# Patient Record
Sex: Male | Born: 1955
Health system: Southern US, Community
[De-identification: ages and names within clinical notes are randomized; demographics above are authoritative.]

## PROBLEM LIST (undated history)

## (undated) DIAGNOSIS — I6529 Occlusion and stenosis of unspecified carotid artery: Secondary | ICD-10-CM

## (undated) DIAGNOSIS — I351 Nonrheumatic aortic (valve) insufficiency: Secondary | ICD-10-CM

## (undated) DIAGNOSIS — I10 Essential (primary) hypertension: Secondary | ICD-10-CM

## (undated) DIAGNOSIS — Z952 Presence of prosthetic heart valve: Secondary | ICD-10-CM

## (undated) DIAGNOSIS — N200 Calculus of kidney: Secondary | ICD-10-CM

## (undated) DIAGNOSIS — D126 Benign neoplasm of colon, unspecified: Secondary | ICD-10-CM

## (undated) DIAGNOSIS — I219 Acute myocardial infarction, unspecified: Secondary | ICD-10-CM

## (undated) DIAGNOSIS — M1990 Unspecified osteoarthritis, unspecified site: Secondary | ICD-10-CM

## (undated) DIAGNOSIS — I2102 ST elevation (STEMI) myocardial infarction involving left anterior descending coronary artery: Secondary | ICD-10-CM

## (undated) DIAGNOSIS — K648 Other hemorrhoids: Secondary | ICD-10-CM

## (undated) DIAGNOSIS — E7800 Pure hypercholesterolemia, unspecified: Secondary | ICD-10-CM

## (undated) DIAGNOSIS — N529 Male erectile dysfunction, unspecified: Secondary | ICD-10-CM

## (undated) DIAGNOSIS — R161 Splenomegaly, not elsewhere classified: Secondary | ICD-10-CM

## (undated) DIAGNOSIS — K5790 Diverticulosis of intestine, part unspecified, without perforation or abscess without bleeding: Secondary | ICD-10-CM

## (undated) DIAGNOSIS — I77810 Thoracic aortic ectasia: Secondary | ICD-10-CM

## (undated) HISTORY — DX: Occlusion and stenosis of unspecified carotid artery: I65.29

## (undated) HISTORY — PX: EYE SURGERY: SHX253

## (undated) HISTORY — DX: Male erectile dysfunction, unspecified: N52.9

## (undated) HISTORY — DX: Acute myocardial infarction, unspecified: I21.9

## (undated) HISTORY — DX: ST elevation (STEMI) myocardial infarction involving left anterior descending coronary artery: I21.02

## (undated) HISTORY — DX: Nonrheumatic aortic (valve) insufficiency: I35.1

## (undated) HISTORY — DX: Presence of prosthetic heart valve: Z95.2

## (undated) HISTORY — DX: Pure hypercholesterolemia, unspecified: E78.00

## (undated) HISTORY — DX: Unspecified osteoarthritis, unspecified site: M19.90

## (undated) HISTORY — DX: Splenomegaly, not elsewhere classified: R16.1

## (undated) HISTORY — PX: COLONOSCOPY: SHX174

## (undated) HISTORY — DX: Benign neoplasm of colon, unspecified: D12.6

## (undated) HISTORY — DX: Thoracic aortic ectasia: I77.810

## (undated) HISTORY — DX: Essential (primary) hypertension: I10

## (undated) HISTORY — DX: Diverticulosis of intestine, part unspecified, without perforation or abscess without bleeding: K57.90

## (undated) HISTORY — DX: Calculus of kidney: N20.0

## (undated) HISTORY — DX: Other hemorrhoids: K64.8

---

## 1991-05-31 HISTORY — PX: LUMBAR DISC SURGERY: SHX700

## 1993-05-30 HISTORY — PX: WRIST FRACTURE SURGERY: SHX121

## 1996-05-30 HISTORY — PX: SHOULDER SURGERY: SHX246

## 1998-05-30 DIAGNOSIS — D126 Benign neoplasm of colon, unspecified: Secondary | ICD-10-CM

## 1998-05-30 HISTORY — DX: Benign neoplasm of colon, unspecified: D12.6

## 1998-08-31 ENCOUNTER — Emergency Department (HOSPITAL_COMMUNITY): Admission: EM | Admit: 1998-08-31 | Discharge: 1998-08-31 | Payer: Self-pay | Admitting: Emergency Medicine

## 1998-08-31 ENCOUNTER — Encounter: Payer: Self-pay | Admitting: Emergency Medicine

## 1999-05-18 ENCOUNTER — Ambulatory Visit (HOSPITAL_COMMUNITY): Admission: RE | Admit: 1999-05-18 | Discharge: 1999-05-18 | Payer: Self-pay | Admitting: *Deleted

## 2001-10-17 ENCOUNTER — Encounter: Admission: RE | Admit: 2001-10-17 | Discharge: 2001-10-17 | Payer: Self-pay | Admitting: Family Medicine

## 2001-10-17 ENCOUNTER — Encounter: Payer: Self-pay | Admitting: Family Medicine

## 2003-09-18 ENCOUNTER — Ambulatory Visit (HOSPITAL_COMMUNITY): Admission: RE | Admit: 2003-09-18 | Discharge: 2003-09-18 | Payer: Self-pay | Admitting: *Deleted

## 2005-09-12 ENCOUNTER — Encounter: Admission: RE | Admit: 2005-09-12 | Discharge: 2005-09-12 | Payer: Self-pay | Admitting: Family Medicine

## 2006-05-30 HISTORY — PX: REFRACTIVE SURGERY: SHX103

## 2007-05-31 HISTORY — PX: CHOLECYSTECTOMY: SHX55

## 2008-01-21 ENCOUNTER — Encounter: Admission: RE | Admit: 2008-01-21 | Discharge: 2008-01-21 | Payer: Self-pay | Admitting: Family Medicine

## 2008-01-24 ENCOUNTER — Encounter (INDEPENDENT_AMBULATORY_CARE_PROVIDER_SITE_OTHER): Payer: Self-pay | Admitting: General Surgery

## 2008-01-24 ENCOUNTER — Ambulatory Visit (HOSPITAL_COMMUNITY): Admission: RE | Admit: 2008-01-24 | Discharge: 2008-01-24 | Payer: Self-pay | Admitting: General Surgery

## 2009-04-01 ENCOUNTER — Encounter: Admission: RE | Admit: 2009-04-01 | Discharge: 2009-04-01 | Payer: Self-pay | Admitting: Family Medicine

## 2010-10-12 NOTE — Op Note (Signed)
Dwayne [name], Dwayne [name]              ACCOUNT NO.:  [id number]   MEDICAL RECORD NO.:  [id number]          PATIENT TYPE:  AMB   LOCATION:  SDS                          FACILITY:  [facility]   PHYSICIAN:  Cherylynn Ridges, M.D.    DATE OF BIRTH:  Sep 10, 1955   DATE OF PROCEDURE:  01/24/2008  DATE OF DISCHARGE:                               OPERATIVE REPORT   PREOPERATIVE DIAGNOSIS:  Symptomatic cholelithiasis.   POSTOPERATIVE DIAGNOSIS:  Symptomatic cholelithiasis with acute  cholecystitis.   PROCEDURE:  Laparoscopic cholecystectomy with cholangiogram.   SURGEON:  Cherylynn Ridges, MD   ANESTHESIA:  General endotracheal.   ESTIMATED BLOOD LOSS:  Less than 20 mL.   COMPLICATIONS:  None.   CONDITION:  Stable.   FINDINGS:  The patient had an acutely inflamed tense gallbladder with  multiple multifaceted stones contained within.  These were mixed  cholesterol and pigmented stones.   INDICATIONS FOR OPERATION:  The patient is a 55 year old male with  severe right upper quadrant and epigastric pain who now comes in for  laparoscopic cholecystectomy.   SPECIMENS:  Gallbladder.   OPERATION:  The patient was taken to the operating room, placed on the  table in the supine position.  After an adequate general endotracheal  anesthetic was administered, he was prepped and draped in the usual  sterile manner exposing the midline and the right upper quadrant  peritoneum.   A supraumbilical curvilinear incision was made using #11 blade and taken  down to the midline fascia.  We grabbed the midline fascia with Kocher  clamps x2.  I then made an incision between the Kocher clamps with a #15  blade into the preperitoneal space.  We grabbed the edges of the fascia  using Kocher clamps and while tenting up bluntly dissected down through  the preperitoneal space entering into the peritoneal cavity.  Once we  had an adequate opening into the peritoneal cavity, a purse-string  suture of 0 Vicryl was passed  around the fascial opening, which secured  in a Hasson cannula, which was subsequently slipped into the peritoneal  cavity.  Once the Hasson cannula was in place, we insufflated carbon  dioxide gas up to a maximal intraabdominal pressure of 15 mmHg.  Once  this was done, the patient was placed in reverse Trendelenburg.  The  left side was tilted down.   Two right costal margin, a subcostal 5-mm cannula, and a subxiphoid 11-  mm cannula passed under direct vision.  Once these cannulas were in  place, we were able to dissect out the gallbladder using a ratcheted  grasp through the lateral-most cannula retracting the gallbladder  towards the anterior abdominal wall in the right upper quadrant.  It was  noted during the first attempt to do so that the gallbladder was too  tense to grasp and therefore we had to use a Nezhat aspirator and needle  in order to decompress the gallbladder from the dome.  This allowed [place]  to grasp it and then retract it in the manner previously described using  a second grasper on the infundibulum, which was  packed with stones.   We were able to dissect out the peritoneum overlying the triangle of  Calot and hepatoduodenal triangle isolating both the cystic duct and the  cystic artery.  Once we had adequate dissection of the cystic duct, a  clip was placed along the gallbladder side.  A cholecystodochotomy was  made using laparoscopic scissors, and a cholangiogram was performed  using a Cook catheter, which had been passed though the anterior  abdominal wall.  The cholangiogram showed good flow into the duodenum,  good proximal filling, no filling defects, and no dilatation of the  duct.  There was slight bowing of the common duct medially from unknown  sources; however, there was no obstruction.   Once the cholangiogram was completed, we removed the clips securing the  catheter in place, and then subsequently clipped the cystic duct  distally x3 and transected  the cystic duct.  The cystic artery was  subsequently found and two clips were placed proximally and one distally  as we transected the cystic artery.  We then carefully dissected out the  gallbladder from its bed using electrocautery, with an opening being  made near the dome, where several stones fell out which we were able to  retrieve with a large grasper.  Once we had the gallbladder completely  detached, we obtained hemostasis in the hepatic bed using  electrocautery.  There was no bleeding at the end.  We use an EndoCatch  bag to retrieve the gallbladder from the supraumbilical site.  The  fascia there did have to be enlarged in order to remove the gallbladder  and the stones.  Once this was done and the gallbladder was removed, we  passed the Hasson cannula back in place and inspected the hepatic bed  for bleeding.  There was minimal bleeding.  We aspirated and irrigated  about a liter of saline solution.  Then, we aspirated all fluid and gas  from above the liver and removed all cannulas.  The purse-string suture  which had held in the Hasson cannula was tied down initially, and there  was still a fascial opening, which we fixed with a figure-of-eight  stitch of 0 Vicryl using a UR-6 needle.  Once this was done, we had  complete fascial closure at the supraumbilical site.  We injected 0.25%  Marcaine at all sites.  We closed the skin at the subxiphoid and the  supraumbilical sites using a running subcuticular stitch of 4-0 Vicryl.  Then, we used Dermabond to close the lateral cannula sites and also to  seal the subxiphoid and the supraumbilical sites.  Steri-Strips and  Tegaderm were used to complete the dressing.  All counts were correct  including needle, sponges, and instruments.      Cherylynn Ridges, M.D.  Electronically Signed     JOW/MEDQ  D:  01/24/2008  T:  01/25/2008  Job:  [id number]

## 2010-10-15 NOTE — Op Note (Signed)
NAME:  Dwayne [name], PARTCH NO.:  [id number]   MEDICAL RECORD NO.:  [id number]                   PATIENT TYPE:  AMB   LOCATION:  ENDO                                 FACILITY:  [facility]   PHYSICIAN:  Georgiana Spinner, M.D.                 DATE OF BIRTH:  [birth year]   DATE OF PROCEDURE:  09/18/2003  DATE OF DISCHARGE:                                 OPERATIVE REPORT   PROCEDURE:  Colonoscopy.   INDICATIONS FOR PROCEDURE:  Previous colon polyps.   ANESTHESIA:  Demerol 100, Versed 10 mg.   PROCEDURE:  With the patient mildly sedated in the left lateral decubitus  position, I attempted a rectal examination but he apparently had a fissure  and was very tender and would not allow a full rectal.  Subsequently, the  Olympus videoscopic colonoscope was inserted in the rectum and passed under  direct vision to the cecum identified by the base of the cecum, crows foot,  and ileocecal valve, the later of which were photographed.  From this point,  the colonoscope was slowly withdrawn taking circumferential views of the  colonic mucosa stopping only in the rectum which appeared normal on direct  and showed hemorrhoids on retroflex view.  The endoscope was straightened  and withdrawn.  The patient's vital signs and pulse oximeter remained  stable.  The patient tolerated the procedure well without apparent  complications.   FINDINGS:  Internal hemorrhoids, otherwise unremarkable colonoscopic  examination, no evidence of polyps at this time.   PLAN:  Repeat examination in five years or as needed.                                               Georgiana Spinner, M.D.    GMO/MEDQ  D:  09/18/2003  T:  09/18/2003  Job:  [id number]   cc:   Bryan Lemma. Manus Gunning, M.D.  [address] [place]  [place] [address]  Fax: [phone]

## 2014-01-30 ENCOUNTER — Encounter: Payer: Self-pay | Admitting: Internal Medicine

## 2014-04-11 ENCOUNTER — Encounter: Payer: Self-pay | Admitting: *Deleted

## 2014-04-14 ENCOUNTER — Encounter: Payer: Self-pay | Admitting: Internal Medicine

## 2014-04-14 ENCOUNTER — Ambulatory Visit (INDEPENDENT_AMBULATORY_CARE_PROVIDER_SITE_OTHER): Payer: 59 | Admitting: Internal Medicine

## 2014-04-14 VITALS — BP 138/90 | HR 80 | Ht 70.5 in | Wt 231.4 lb

## 2014-04-14 DIAGNOSIS — Z86010 Personal history of colonic polyps: Secondary | ICD-10-CM

## 2014-04-14 DIAGNOSIS — Z860101 Personal history of adenomatous and serrated colon polyps: Secondary | ICD-10-CM | POA: Insufficient documentation

## 2014-04-14 DIAGNOSIS — Z1211 Encounter for screening for malignant neoplasm of colon: Secondary | ICD-10-CM

## 2014-04-14 NOTE — Progress Notes (Signed)
Patient ID: Dwayne [name], male   DOB: 12-Sep-1955, 58 y.o.   MRN: [id number] HPI: Blair Lundeen Is a 58 year old male with a past medical history of adenomatous colon polyp, hypercholesterolemia, kidney stone who is seen in consultation at the request of Dr. Doran Stabler to consider surveillance colonoscopy.  He is here alone today. He reports he is feeling well. He reports his bowel habits have been normal without diarrhea or constipation. No change in his bowel habit including no blood in his stool or melena. No nausea or vomiting. No trouble swallowing. No heartburn. No hepatobiliary complaint. No weight loss. Good appetite. No family history of colorectal cancer.  He has had 3 colonoscopies (Dr. Lajoyce Corners) first in 2000 when cecal polyp was removed. This was found to be a tubular adenoma. Pathology size = 2 mm. Subsequent colonoscopy in 2005 and 2010 were normal with no evidence of adenoma. A small polyp was removed from the sigmoid in 2010 which was a benign lymphoid aggregate without adenomatous tissue. There is minimal left-sided diverticulosis.     Past Medical History  Diagnosis Date  . Adenomatous colon polyp 2000  . Kidney stones   . Hypercholesteremia   . Splenomegaly   . Internal hemorrhoids   . Erectile dysfunction   . Hypertension   . Diverticulosis     Past Surgical History  Procedure Laterality Date  . Wrist fracture surgery  1995    Bilateral  . Lumbar disc surgery  1993  . Shoulder surgery  1998    Left shoulder fracture-clavicle  . Eye surgery      Age 1  . Refractive surgery  2008  . Cholecystectomy  2009    Outpatient Prescriptions Prior to Visit  Medication Sig Dispense Refill  . etodolac (LODINE) 400 MG tablet Take 400 mg by mouth 2 (two) times daily.    Marland Kitchen lisinopril (PRINIVIL,ZESTRIL) 20 MG tablet Take 20 mg by mouth daily.    . simvastatin (ZOCOR) 40 MG tablet Take 40 mg by mouth daily.    . sildenafil (VIAGRA) 50 MG tablet Take 50 mg by mouth daily as needed  for erectile dysfunction.     No facility-administered medications prior to visit.    Allergies  Allergen Reactions  . Codeine Nausea Only  . Lipitor [Atorvastatin]     Myalgia    Family History  Problem Relation Age of Onset  . Heart disease Father   . CVA Brother   . CVA Father     History  Substance Use Topics  . Smoking status: Never Smoker   . Smokeless tobacco: Never Used  . Alcohol Use: 0.0 oz/week    0 Not specified per week     Comment: 2+ per week    ROS: As per history of present illness, otherwise negative  BP 138/90 mmHg  Pulse 80  Ht 5' 10.5" (1.791 m)  Wt 231 lb 6.4 oz (104.962 kg)  BMI 32.72 kg/m2 Constitutional: Well-developed and well-nourished. No distress. HEENT: Normocephalic and atraumatic. Oropharynx is clear and moist. No oropharyngeal exudate. Conjunctivae are normal.  No scleral icterus. Neck: Neck supple. Trachea midline. Cardiovascular: Normal rate, regular rhythm and intact distal pulses. No M/R/G Pulmonary/chest: Effort normal and breath sounds normal. No wheezing, rales or rhonchi. Abdominal: Soft, nontender, nondistended. Bowel sounds active throughout. There are no masses palpable. No hepatosplenomegaly. Extremities: no clubbing, cyanosis, or edema Lymphadenopathy: No cervical adenopathy noted. Neurological: Alert and oriented to person place and time. Skin: Skin is warm and dry. No rashes  noted. Psychiatric: Normal mood and affect. Behavior is normal.  Labs reviewed from July 2015 and primary care, CMP unremarkable.  ASSESSMENT/PLAN: 58 year old male with a past medical history of adenomatous colon polyp, hypercholesterolemia, kidney stone who is seen in consultation at the request of Dr. Doran Stabler to consider surveillance colonoscopy.  1. CRC screening/hx of adenomatous colon polyp 2000 -- He has a history of small adenoma in 2000 but subsequent normal colonoscopies without adenomatous polyps in 2005 at 2010. We discussed how  recent data supports returning to normal surveillance interval of every 10 years if subsequent colonoscopies are normal after colonoscopy with removal of a small or low risk adenoma. He is symptom-free and has no family history of colorectal cancer.I do feel repeating the colonoscopy in July 2020 is appropriate. He understands and agrees with this recommendation. We will check annual FOBT each year between now and 2020. This is ever positive, the colonoscopy would be recommended at that time, otherwise repeat in July 2020

## 2014-04-14 NOTE — Patient Instructions (Addendum)
Your physician has requested that you go to the basement for the following lab work before leaving today: IFOB  You will need IFOB tests completed yearly until your colonoscopy 11/2018.  You will be due for a recall colonoscopy in 11/2018. We will send you a reminder in the mail when it gets closer to that time.  CC:Dr Gaynelle Arabian

## 2015-07-07 ENCOUNTER — Other Ambulatory Visit: Payer: Self-pay | Admitting: Family Medicine

## 2015-07-07 DIAGNOSIS — N5089 Other specified disorders of the male genital organs: Secondary | ICD-10-CM

## 2015-07-15 ENCOUNTER — Other Ambulatory Visit: Payer: Self-pay | Admitting: Family Medicine

## 2015-07-15 ENCOUNTER — Ambulatory Visit
Admission: RE | Admit: 2015-07-15 | Discharge: 2015-07-15 | Disposition: A | Discharge status: Home / Self Care | Payer: Commercial Managed Care - HMO | Source: Ambulatory Visit | Attending: Family Medicine | Admitting: Family Medicine

## 2015-07-15 DIAGNOSIS — N5089 Other specified disorders of the male genital organs: Secondary | ICD-10-CM

## 2015-12-07 ENCOUNTER — Other Ambulatory Visit: Payer: Self-pay | Admitting: Family Medicine

## 2015-12-07 ENCOUNTER — Ambulatory Visit
Admission: RE | Admit: 2015-12-07 | Discharge: 2015-12-07 | Disposition: A | Discharge status: Home / Self Care | Payer: Commercial Managed Care - HMO | Source: Ambulatory Visit | Attending: Family Medicine | Admitting: Family Medicine

## 2015-12-07 DIAGNOSIS — M542 Cervicalgia: Secondary | ICD-10-CM

## 2015-12-22 ENCOUNTER — Other Ambulatory Visit: Payer: Self-pay | Admitting: Orthopaedic Surgery

## 2015-12-22 DIAGNOSIS — M542 Cervicalgia: Secondary | ICD-10-CM

## 2016-01-05 ENCOUNTER — Ambulatory Visit
Admission: RE | Admit: 2016-01-05 | Discharge: 2016-01-05 | Disposition: A | Discharge status: Home / Self Care | Payer: Commercial Managed Care - HMO | Source: Ambulatory Visit | Attending: Orthopaedic Surgery | Admitting: Orthopaedic Surgery

## 2016-01-05 DIAGNOSIS — M542 Cervicalgia: Secondary | ICD-10-CM

## 2016-01-07 ENCOUNTER — Other Ambulatory Visit: Payer: Commercial Managed Care - HMO

## 2016-01-20 ENCOUNTER — Inpatient Hospital Stay (HOSPITAL_COMMUNITY)
Admission: EM | Admit: 2016-01-20 | Discharge: 2016-01-22 | DRG: 246 | Disposition: A | Discharge status: Home / Self Care | Payer: Commercial Managed Care - HMO | Source: Other Acute Inpatient Hospital | Attending: Cardiovascular Disease | Admitting: Cardiovascular Disease

## 2016-01-20 ENCOUNTER — Encounter (HOSPITAL_COMMUNITY): Payer: Self-pay | Admitting: *Deleted

## 2016-01-20 ENCOUNTER — Inpatient Hospital Stay (HOSPITAL_COMMUNITY)
Admission: EM | Disposition: A | Discharge status: Home / Self Care | Payer: Self-pay | Attending: Cardiovascular Disease

## 2016-01-20 DIAGNOSIS — I2510 Atherosclerotic heart disease of native coronary artery without angina pectoris: Secondary | ICD-10-CM

## 2016-01-20 DIAGNOSIS — R42 Dizziness and giddiness: Secondary | ICD-10-CM | POA: Diagnosis not present

## 2016-01-20 DIAGNOSIS — Z823 Family history of stroke: Secondary | ICD-10-CM

## 2016-01-20 DIAGNOSIS — I2102 ST elevation (STEMI) myocardial infarction involving left anterior descending coronary artery: Secondary | ICD-10-CM | POA: Diagnosis present

## 2016-01-20 DIAGNOSIS — Z8249 Family history of ischemic heart disease and other diseases of the circulatory system: Secondary | ICD-10-CM

## 2016-01-20 DIAGNOSIS — I5022 Chronic systolic (congestive) heart failure: Secondary | ICD-10-CM | POA: Diagnosis not present

## 2016-01-20 DIAGNOSIS — E7800 Pure hypercholesterolemia, unspecified: Secondary | ICD-10-CM | POA: Diagnosis present

## 2016-01-20 DIAGNOSIS — R008 Other abnormalities of heart beat: Secondary | ICD-10-CM | POA: Diagnosis present

## 2016-01-20 DIAGNOSIS — Z9049 Acquired absence of other specified parts of digestive tract: Secondary | ICD-10-CM | POA: Diagnosis not present

## 2016-01-20 DIAGNOSIS — I63432 Cerebral infarction due to embolism of left posterior cerebral artery: Secondary | ICD-10-CM | POA: Diagnosis not present

## 2016-01-20 DIAGNOSIS — Z885 Allergy status to narcotic agent status: Secondary | ICD-10-CM | POA: Diagnosis not present

## 2016-01-20 DIAGNOSIS — R55 Syncope and collapse: Secondary | ICD-10-CM | POA: Diagnosis not present

## 2016-01-20 DIAGNOSIS — I5021 Acute systolic (congestive) heart failure: Secondary | ICD-10-CM | POA: Diagnosis present

## 2016-01-20 DIAGNOSIS — E785 Hyperlipidemia, unspecified: Secondary | ICD-10-CM | POA: Diagnosis not present

## 2016-01-20 DIAGNOSIS — I5042 Chronic combined systolic (congestive) and diastolic (congestive) heart failure: Secondary | ICD-10-CM | POA: Diagnosis present

## 2016-01-20 DIAGNOSIS — I10 Essential (primary) hypertension: Secondary | ICD-10-CM | POA: Diagnosis not present

## 2016-01-20 DIAGNOSIS — I6312 Cerebral infarction due to embolism of basilar artery: Secondary | ICD-10-CM | POA: Diagnosis not present

## 2016-01-20 DIAGNOSIS — I110 Hypertensive heart disease with heart failure: Secondary | ICD-10-CM | POA: Diagnosis present

## 2016-01-20 DIAGNOSIS — Z888 Allergy status to other drugs, medicaments and biological substances status: Secondary | ICD-10-CM | POA: Diagnosis not present

## 2016-01-20 DIAGNOSIS — I2109 ST elevation (STEMI) myocardial infarction involving other coronary artery of anterior wall: Secondary | ICD-10-CM | POA: Diagnosis present

## 2016-01-20 HISTORY — PX: CARDIAC CATHETERIZATION: SHX172

## 2016-01-20 LAB — COMPREHENSIVE METABOLIC PANEL
ALBUMIN: 4.2 g/dL (ref 3.5–5.0)
ALT: 48 U/L (ref 17–63)
AST: 39 U/L (ref 15–41)
Alkaline Phosphatase: 70 U/L (ref 38–126)
Anion gap: 9 (ref 5–15)
BUN: 15 mg/dL (ref 6–20)
CHLORIDE: 99 mmol/L — AB (ref 101–111)
CO2: 30 mmol/L (ref 22–32)
Calcium: 9.5 mg/dL (ref 8.9–10.3)
Creatinine, Ser: 1.22 mg/dL (ref 0.61–1.24)
GFR calc Af Amer: >60 mL/min (ref >60)
GFR calc non Af Amer: >60 mL/min (ref >60)
GLUCOSE: 120 mg/dL — AB (ref 65–99)
POTASSIUM: 3.7 mmol/L (ref 3.5–5.1)
Sodium: 138 mmol/L (ref 135–145)
Total Bilirubin: 1.1 mg/dL (ref 0.3–1.2)
Total Protein: 7.1 g/dL (ref 6.5–8.1)

## 2016-01-20 LAB — POCT I-STAT, CHEM 8
BUN: 19 mg/dL (ref 6–20)
CALCIUM ION: 1.21 mmol/L (ref 1.12–1.23)
Chloride: 96 mmol/L — ABNORMAL LOW (ref 101–111)
Creatinine, Ser: 1.1 mg/dL (ref 0.61–1.24)
GLUCOSE: 117 mg/dL — AB (ref 65–99)
HCT: 53 % — ABNORMAL HIGH (ref 39.0–52.0)
HEMOGLOBIN: 18 g/dL — AB (ref 13.0–17.0)
POTASSIUM: 3.8 mmol/L (ref 3.5–5.1)
SODIUM: 141 mmol/L (ref 135–145)
TCO2: 33 mmol/L (ref 0–100)

## 2016-01-20 LAB — CBC
HCT: 51.3 % (ref 39.0–52.0)
HEMOGLOBIN: 18.6 g/dL — AB (ref 13.0–17.0)
MCH: 34.4 pg — AB (ref 26.0–34.0)
MCHC: 36.3 g/dL — AB (ref 30.0–36.0)
MCV: 94.8 fL (ref 78.0–100.0)
Platelets: 301 10*3/uL (ref 150–400)
RBC: 5.41 MIL/uL (ref 4.22–5.81)
RDW: 13.3 % (ref 11.5–15.5)
WBC: 19.9 10*3/uL — ABNORMAL HIGH (ref 4.0–10.5)

## 2016-01-20 LAB — LIPID PANEL
CHOL/HDL RATIO: 5.1 ratio
CHOLESTEROL: 216 mg/dL — AB (ref 0–200)
HDL: 42 mg/dL (ref >40)
LDL Cholesterol: 139 mg/dL — ABNORMAL HIGH (ref 0–99)
Triglycerides: 175 mg/dL — ABNORMAL HIGH (ref <150)
VLDL: 35 mg/dL (ref 0–40)

## 2016-01-20 LAB — CK TOTAL AND CKMB (NOT AT ARMC)
CK TOTAL: 54 U/L (ref 49–397)
CK, MB: 2.6 ng/mL (ref 0.5–5.0)
Relative Index: INVALID (ref 0.0–2.5)

## 2016-01-20 LAB — TROPONIN I: TROPONIN I: 2.27 ng/mL — AB (ref <0.03)

## 2016-01-20 LAB — POCT ACTIVATED CLOTTING TIME: ACTIVATED CLOTTING TIME: 340 s

## 2016-01-20 LAB — MRSA PCR SCREENING: MRSA by PCR: NEGATIVE

## 2016-01-20 SURGERY — LEFT HEART CATH AND CORONARY ANGIOGRAPHY

## 2016-01-20 MED ORDER — ROSUVASTATIN CALCIUM 10 MG PO TABS
40.0000 mg | ORAL_TABLET | Freq: Every day | ORAL | Status: DC
  Administered 2016-01-20 – 2016-01-21 (×2): 40 mg via ORAL
  Filled 2016-01-20: qty 4
  Filled 2016-01-20: qty 2

## 2016-01-20 MED ORDER — LIDOCAINE HCL (PF) 1 % IJ SOLN
INTRAMUSCULAR | Status: DC | PRN
  Administered 2016-01-20: 20 mL
  Administered 2016-01-20: 2 mL

## 2016-01-20 MED ORDER — SODIUM CHLORIDE 0.9 % IV SOLN: 250.0000 mL | INTRAVENOUS | Status: DC | PRN

## 2016-01-20 MED ORDER — MIDAZOLAM HCL 2 MG/2ML IJ SOLN
INTRAMUSCULAR | Status: DC | PRN
  Administered 2016-01-20: 1 mg via INTRAVENOUS

## 2016-01-20 MED ORDER — TICAGRELOR 90 MG PO TABS
ORAL_TABLET | ORAL | Status: AC
  Filled 2016-01-20: qty 1

## 2016-01-20 MED ORDER — NITROGLYCERIN 1 MG/10 ML FOR IR/CATH LAB
INTRA_ARTERIAL | Status: AC
  Filled 2016-01-20: qty 10

## 2016-01-20 MED ORDER — NITROGLYCERIN 1 MG/10 ML FOR IR/CATH LAB
INTRA_ARTERIAL | Status: DC | PRN
  Administered 2016-01-20: 200 ug via INTRACORONARY

## 2016-01-20 MED ORDER — SODIUM CHLORIDE 0.9 % IV SOLN
INTRAVENOUS | Status: DC | PRN
  Administered 2016-01-20: 100 mL/h via INTRAVENOUS

## 2016-01-20 MED ORDER — FENTANYL CITRATE (PF) 100 MCG/2ML IJ SOLN
INTRAMUSCULAR | Status: AC
  Filled 2016-01-20: qty 2

## 2016-01-20 MED ORDER — VERAPAMIL HCL 2.5 MG/ML IV SOLN
INTRAVENOUS | Status: AC
  Filled 2016-01-20: qty 2

## 2016-01-20 MED ORDER — METOPROLOL TARTRATE 25 MG PO TABS
25.0000 mg | ORAL_TABLET | Freq: Two times a day (BID) | ORAL | Status: DC
  Administered 2016-01-20: 25 mg via ORAL
  Filled 2016-01-20: qty 1

## 2016-01-20 MED ORDER — ONDANSETRON HCL 4 MG/2ML IJ SOLN: 4.0000 mg | Freq: Four times a day (QID) | INTRAMUSCULAR | Status: DC | PRN

## 2016-01-20 MED ORDER — LISINOPRIL 10 MG PO TABS
20.0000 mg | ORAL_TABLET | Freq: Every day | ORAL | Status: DC
  Administered 2016-01-20 – 2016-01-22 (×3): 20 mg via ORAL
  Filled 2016-01-20: qty 1
  Filled 2016-01-20: qty 2
  Filled 2016-01-20: qty 1

## 2016-01-20 MED ORDER — BIVALIRUDIN 250 MG IV SOLR
INTRAVENOUS | Status: AC
  Filled 2016-01-20: qty 250

## 2016-01-20 MED ORDER — LIDOCAINE HCL (PF) 1 % IJ SOLN
INTRAMUSCULAR | Status: AC
  Filled 2016-01-20: qty 30

## 2016-01-20 MED ORDER — TICAGRELOR 90 MG PO TABS
90.0000 mg | ORAL_TABLET | Freq: Two times a day (BID) | ORAL | Status: DC
  Administered 2016-01-21 – 2016-01-22 (×3): 90 mg via ORAL
  Filled 2016-01-20 (×4): qty 1

## 2016-01-20 MED ORDER — IOPAMIDOL (ISOVUE-370) INJECTION 76%
INTRAVENOUS | Status: AC
  Filled 2016-01-20: qty 100

## 2016-01-20 MED ORDER — SODIUM CHLORIDE 0.9% FLUSH
3.0000 mL | Freq: Two times a day (BID) | INTRAVENOUS | Status: DC
  Administered 2016-01-21 – 2016-01-22 (×3): 3 mL via INTRAVENOUS

## 2016-01-20 MED ORDER — MIDAZOLAM HCL 2 MG/2ML IJ SOLN
INTRAMUSCULAR | Status: AC
  Filled 2016-01-20: qty 2

## 2016-01-20 MED ORDER — ASPIRIN 81 MG PO CHEW
81.0000 mg | CHEWABLE_TABLET | Freq: Every day | ORAL | Status: DC
  Administered 2016-01-21 – 2016-01-22 (×2): 81 mg via ORAL
  Filled 2016-01-20 (×2): qty 1

## 2016-01-20 MED ORDER — ACETAMINOPHEN 325 MG PO TABS
650.0000 mg | ORAL_TABLET | ORAL | Status: DC | PRN
Start: 2016-01-20 — End: 2016-01-22
  Administered 2016-01-21 (×2): 650 mg via ORAL
  Filled 2016-01-20 (×2): qty 2

## 2016-01-20 MED ORDER — BIVALIRUDIN BOLUS VIA INFUSION - CUPID
INTRAVENOUS | Status: DC | PRN
  Administered 2016-01-20: 78.75 mg via INTRAVENOUS

## 2016-01-20 MED ORDER — SODIUM CHLORIDE 0.9% FLUSH: 3.0000 mL | INTRAVENOUS | Status: DC | PRN

## 2016-01-20 MED ORDER — FENTANYL CITRATE (PF) 100 MCG/2ML IJ SOLN
INTRAMUSCULAR | Status: DC | PRN
  Administered 2016-01-20: 25 ug via INTRAVENOUS

## 2016-01-20 MED ORDER — IOPAMIDOL (ISOVUE-370) INJECTION 76%
INTRAVENOUS | Status: DC | PRN
  Administered 2016-01-20: 195 mL via INTRA_ARTERIAL

## 2016-01-20 MED ORDER — IOPAMIDOL (ISOVUE-370) INJECTION 76%
INTRAVENOUS | Status: AC
  Filled 2016-01-20: qty 125

## 2016-01-20 MED ORDER — TICAGRELOR 90 MG PO TABS
ORAL_TABLET | ORAL | Status: DC | PRN
  Administered 2016-01-20: 180 mg via ORAL

## 2016-01-20 MED ORDER — HEPARIN (PORCINE) IN NACL 2-0.9 UNIT/ML-% IJ SOLN
INTRAMUSCULAR | Status: AC
  Filled 2016-01-20: qty 1500

## 2016-01-20 MED ORDER — SODIUM CHLORIDE 0.9 % IV SOLN
INTRAVENOUS | Status: DC | PRN
  Administered 2016-01-20: 1.75 mg/kg/h via INTRAVENOUS

## 2016-01-20 MED ORDER — SODIUM CHLORIDE 0.9 % IV SOLN
INTRAVENOUS | Status: DC
  Administered 2016-01-20: 75 mL/h via INTRAVENOUS

## 2016-01-20 SURGICAL SUPPLY — 19 items
BALLN EMERGE MR 2.5X12 (BALLOONS) ×2
BALLN ~~LOC~~ EUPHORA RX 3.5X12 (BALLOONS) ×2
BALLOON EMERGE MR 2.5X12 (BALLOONS) ×1 IMPLANT
BALLOON ~~LOC~~ EUPHORA RX 3.5X12 (BALLOONS) ×1 IMPLANT
CATH INFINITI 5FR ANG PIGTAIL (CATHETERS) ×1 IMPLANT
CATH INFINITI JR4 5F (CATHETERS) ×2 IMPLANT
CATH VISTA GUIDE 6FR XBLAD3.5 (CATHETERS) ×2 IMPLANT
ELECT DEFIB PAD ADLT CADENCE (PAD) ×1 IMPLANT
GLIDESHEATH SLEND SS 6F .021 (SHEATH) ×1 IMPLANT
KIT ENCORE 26 ADVANTAGE (KITS) ×2 IMPLANT
KIT HEART LEFT (KITS) ×2 IMPLANT
PACK CARDIAC CATHETERIZATION (CUSTOM PROCEDURE TRAY) ×2 IMPLANT
SHEATH PINNACLE 6F 10CM (SHEATH) ×2 IMPLANT
STENT PROMUS PREM MR 3.5X16 (Permanent Stent) ×1 IMPLANT
SYR MEDRAD MARK V 150ML (SYRINGE) ×2 IMPLANT
TRANSDUCER W/STOPCOCK (MISCELLANEOUS) ×2 IMPLANT
TUBING CIL FLEX 10 FLL-RA (TUBING) ×2 IMPLANT
WIRE COUGAR XT STRL 190CM (WIRE) ×1 IMPLANT
WIRE EMERALD 3MM-J .035X150CM (WIRE) ×2 IMPLANT

## 2016-01-20 NOTE — H&P (Signed)
     Patient ID: Dwayne [name] MRN: [id number] DOB/AGE: 62-Oct-1957 62 y.o. Admit date: 01/20/2016  Primary Care Physician: Simona Huh, MD Primary Cardiologist: New  HPI: 60 yo male with history of HTN, HLD who presents today with anterior STEMI. He began having chest pain 20 minutes ago and callled EMS. EKG with ST elevations in the anterior leads c/w STEMI. Code STEMI activated and patient brought straight to the cath lab. He is having ongoing chest pain upon arrival to the cath lab.   Review of systems complete and found to be negative unless listed above   Past Medical History:  Diagnosis Date  . Adenomatous colon polyp 2000  . Diverticulosis   . Erectile dysfunction   . Hypercholesteremia   . Hypertension   . Internal hemorrhoids   . Kidney stones   . Splenomegaly     Family History  Problem Relation Age of Onset  . Heart disease Father   . CVA Brother   . CVA Father     Social History   Social History  . Marital status: Married    Spouse name: N/A  . Number of children: 1  . Years of education: N/A   Occupational History  . Research Tech    Social History Main Topics  . Smoking status: Never Smoker  . Smokeless tobacco: Never Used  . Alcohol use 0.0 oz/week     Comment: 2+ per week  . Drug use: No  . Sexual activity: Not on file   Other Topics Concern  . Not on file   Social History Narrative  . No narrative on file    Past Surgical History:  Procedure Laterality Date  . CHOLECYSTECTOMY  2009  . EYE SURGERY     Age 61  . [place] SURGERY  1993  . REFRACTIVE SURGERY  2008  . SHOULDER SURGERY  1998   Left shoulder fracture-clavicle  . WRIST FRACTURE SURGERY  1995   Bilateral    Allergies  Allergen Reactions  . Codeine Nausea Only  . Lipitor [Atorvastatin]     Myalgia    Prior to Admission Meds:  Prior to Admission medications   Medication Sig Start Date End Date Taking? Authorizing Provider  etodolac (LODINE) 400 MG tablet  Take 400 mg by mouth 2 (two) times daily.    Historical Provider, MD  lisinopril (PRINIVIL,ZESTRIL) 20 MG tablet Take 20 mg by mouth daily.    Historical Provider, MD  simvastatin (ZOCOR) 40 MG tablet Take 40 mg by mouth daily.    Historical Provider, MD    Physical Exam: 170/70  HR95 bpm. RR 12  General: Well developed, well nourished, diaphoretic, alert and oriented.  HEENT: OP clear, mucus membranes moist  SKIN: warm, dry. No rashes.  Neuro: No focal deficits  Musculoskeletal: Muscle strength 5/5 all ext  Psychiatric: Mood and affect normal  Neck: No JVD, no carotid bruits, no thyromegaly, no lymphadenopathy.  Lungs:Clear bilaterally, no wheezes, rhonci, crackles  Cardiovascular: tachy. Regular. No murmurs, gallops or rubs.  Abdomen:Soft. Bowel sounds present. Non-tender.  Extremities: No lower extremity edema. Pulses are 2 + in the bilateral DP/PT.   Labs: pending.   EKG: sinus tach, 5 mm ST elevation V2-V4.   ASSESSMENT AND PLAN:   1. Acute anterior STEMI: Pt with ongoing chest pain. EKG with ST elevations in the anterior leads c/w STEMI. Plan emergent cardiac cath with PCI. Further plans to follow after cath.   Darlina Guys, MD 01/20/2016, 4:11 PM

## 2016-01-21 ENCOUNTER — Inpatient Hospital Stay (HOSPITAL_COMMUNITY): Payer: Commercial Managed Care - HMO

## 2016-01-21 ENCOUNTER — Encounter (HOSPITAL_COMMUNITY): Payer: Self-pay | Admitting: Cardiovascular Disease

## 2016-01-21 DIAGNOSIS — E785 Hyperlipidemia, unspecified: Secondary | ICD-10-CM

## 2016-01-21 DIAGNOSIS — I2510 Atherosclerotic heart disease of native coronary artery without angina pectoris: Secondary | ICD-10-CM

## 2016-01-21 DIAGNOSIS — I10 Essential (primary) hypertension: Secondary | ICD-10-CM

## 2016-01-21 DIAGNOSIS — I5021 Acute systolic (congestive) heart failure: Secondary | ICD-10-CM

## 2016-01-21 LAB — BASIC METABOLIC PANEL
Anion gap: 9 (ref 5–15)
BUN: 14 mg/dL (ref 6–20)
CALCIUM: 8.6 mg/dL — AB (ref 8.9–10.3)
CHLORIDE: 100 mmol/L — AB (ref 101–111)
CO2: 26 mmol/L (ref 22–32)
CREATININE: 0.99 mg/dL (ref 0.61–1.24)
GFR calc Af Amer: >60 mL/min (ref >60)
GFR calc non Af Amer: >60 mL/min (ref >60)
Glucose, Bld: 96 mg/dL (ref 65–99)
Potassium: 3.6 mmol/L (ref 3.5–5.1)
SODIUM: 135 mmol/L (ref 135–145)

## 2016-01-21 LAB — HEMOGLOBIN A1C
HEMOGLOBIN A1C: 5.1 % (ref 4.8–5.6)
Mean Plasma Glucose: 100 mg/dL

## 2016-01-21 LAB — CBC
HCT: 49.7 % (ref 39.0–52.0)
Hemoglobin: 17.7 g/dL — ABNORMAL HIGH (ref 13.0–17.0)
MCH: 33.3 pg (ref 26.0–34.0)
MCHC: 35.6 g/dL (ref 30.0–36.0)
MCV: 93.6 fL (ref 78.0–100.0)
PLATELETS: 232 10*3/uL (ref 150–400)
RBC: 5.31 MIL/uL (ref 4.22–5.81)
RDW: 13.3 % (ref 11.5–15.5)
WBC: 18 10*3/uL — AB (ref 4.0–10.5)

## 2016-01-21 LAB — PROTIME-INR
INR: 0.96
INR: 1.07
PROTHROMBIN TIME: 12.8 s (ref 11.4–15.2)
Prothrombin Time: 14 seconds (ref 11.4–15.2)

## 2016-01-21 LAB — TROPONIN I
Troponin I: 19.41 ng/mL (ref <0.03)
Troponin I: 23.15 ng/mL (ref <0.03)

## 2016-01-21 LAB — APTT
APTT: 27 s (ref 24–36)
aPTT: <20 seconds — ABNORMAL LOW (ref 24–36)

## 2016-01-21 LAB — ECHOCARDIOGRAM COMPLETE
Height: 70 in
WEIGHTICAEL: 3594.38 [oz_av]

## 2016-01-21 MED ORDER — PERFLUTREN LIPID MICROSPHERE
INTRAVENOUS | Status: AC
  Filled 2016-01-21: qty 10

## 2016-01-21 MED ORDER — CARVEDILOL 12.5 MG PO TABS
12.5000 mg | ORAL_TABLET | Freq: Two times a day (BID) | ORAL | Status: DC
  Administered 2016-01-21 – 2016-01-22 (×3): 12.5 mg via ORAL
  Filled 2016-01-21 (×3): qty 1

## 2016-01-21 MED ORDER — PERFLUTREN LIPID MICROSPHERE
1.0000 mL | INTRAVENOUS | Status: AC | PRN
  Administered 2016-01-21: 2 mL via INTRAVENOUS
  Filled 2016-01-21: qty 10

## 2016-01-21 NOTE — Progress Notes (Signed)
Patient sitting on side of bed, no needs at this time. Call light within reach.

## 2016-01-21 NOTE — Progress Notes (Signed)
PROGRESS NOTE  Subjective:   Dwayne [name] is a 60 yo Presented with an acute  Anterior MI. S/p stenting of his prox LAD Had a very complex , ulcerated lesion Still has mild plaque just distal to the stent ( and involving the 1st diagonal )   Mild ache , no acute CP  BP remains a bit elevated .   Objective:    Vital Signs:   Temp:  [98 F (36.7 C)-98.5 F (36.9 C)] 98 F (36.7 C) (08/24 0400) Pulse Rate:  [0-113] 85 (08/23 1900) Resp:  [0-27] 19 (08/24 0557) BP: (123-177)/(81-111) 136/96 (08/24 0500) SpO2:  [93 %-98 %] 97 % (08/24 0000) Weight:  [224 lb 10.4 oz (101.9 kg)] 224 lb 10.4 oz (101.9 kg) (08/23 1732)      24-hour weight change: Weight change:   Weight trends: Filed Weights   01/20/16 1732  Weight: 224 lb 10.4 oz (101.9 kg)    Intake/Output:  08/23 0701 - 08/24 0700 In: 605 [I.V.:605] Out: 1570 [Urine:1570] No intake/output data recorded.   Physical Exam: BP (!) 136/96   Pulse 85   Temp 98 F (36.7 C) (Oral)   Resp 19   Ht 5\' 10"  (1.778 m)   Wt 224 lb 10.4 oz (101.9 kg)   SpO2 97%   BMI 32.23 kg/m   Wt Readings from Last 3 Encounters:  01/20/16 224 lb 10.4 oz (101.9 kg)  04/14/14 231 lb 6.4 oz (105 kg)    General: Vital signs reviewed and noted.   Head: Normocephalic, atraumatic.  Eyes: conjunctivae/corneas clear.  EOM's intact.   Throat: normal  Neck:  normal   Lungs:    clear   Heart:  RR   Abdomen:  Soft, non-tender, non-distended    Extremities: Right radial cath site looks god    Neurologic: A&O X3, CN II - XII are grossly intact.   Psych: Normal     Labs: BMET:  Recent Labs  01/20/16 1615  NA 141  138  K 3.8  3.7  CL 96*  99*  CO2 30  GLUCOSE 117*  120*  BUN 19  15  CREATININE 1.10  1.22  CALCIUM 9.5    Liver function tests:  Recent Labs  01/20/16 1615  AST 39  ALT 48  ALKPHOS 70  BILITOT 1.1  PROT 7.1  ALBUMIN 4.2   No results for input(s): LIPASE, AMYLASE in the last 72  hours.  CBC:  Recent Labs  01/20/16 1615 01/21/16 0658  WBC 19.9* 18.0*  HGB 18.0*  18.6* 17.7*  HCT 53.0*  51.3 49.7  MCV 94.8 93.6  PLT 301 232    Cardiac Enzymes:  Recent Labs  01/20/16 1615 01/20/16 1822 01/20/16 2255  CKTOTAL 54  --   --   CKMB 2.6  --   --   TROPONINI  --  2.27* 19.41*    Coagulation Studies:  Recent Labs  01/20/16 1615  LABPROT 12.8  INR 0.96    Other: Invalid input(s): POCBNP No results for input(s): DDIMER in the last 72 hours.  Recent Labs  01/20/16 1615  HGBA1C 5.1    Recent Labs  01/20/16 1615  CHOL 216*  HDL 42  LDLCALC 139*  TRIG 175*  CHOLHDL 5.1   No results for input(s): TSH, T4TOTAL, T3FREE, THYROIDAB in the last 72 hours.  Invalid input(s): FREET3 No results for input(s): VITAMINB12, FOLATE, FERRITIN, TIBC, IRON, RETICCTPCT in the last 72 hours.   Other results:  EKG  ( personally reviewed )   NSR , PACs.   Evolving anterior MI   Medications:    Infusions:    Scheduled Medications: . aspirin  81 mg Oral Daily  . lisinopril  20 mg Oral Daily  . metoprolol tartrate  25 mg Oral BID  . rosuvastatin  40 mg Oral q1800  . sodium chloride flush  3 mL Intravenous Q12H  . ticagrelor  90 mg Oral BID    Assessment/ Plan:   Active Problems:   Acute ST elevation myocardial infarction (STEMI) involving left anterior descending (LAD) coronary artery ([place])  1. Acute anterior MI:   S/p stenting of his prox LAD Doing better  I have personally reviewed the angiograms from yesterday   Continue lisinopril, will change the metoprolol to Coreg.  On ASA, Brilinta  2. Acute systolic CHF - due to his ant. MI EF 30% by cath Echo today Continue coreg, lisinopril EDP was elevated at cath. May need a dose of lasix Will wait to give that since we are starting coreg this am  3. Hyperlipidemia:   Now on crestor Goal LDL is 70  4. HTN - continue meds.   Disposition:  Length of Stay: 1  Thayer Headings,  Brooke Bonito., MD, [facility] 01/21/2016, 8:25 AM Office [phone] Pager [phone]

## 2016-01-21 NOTE — Progress Notes (Signed)
Patient Tx to 2W02 from [place]. VSS. NO chest pain. No SOB. Patient alert and oriented. Patient oriented to the room and unit. Food tray called for patient. Family to the bedside. Call bell within reach. Will continue to monitor.  Domingo Dimes RN

## 2016-01-21 NOTE — Progress Notes (Signed)
CARDIAC REHAB PHASE I   PRE:  Rate/Rhythm: 82 SR  BP:  Supine:   Sitting: 128/89  Standing:    SaO2:   MODE:  Ambulation: 700 ft   POST:  Rate/Rhythm: 99 SR  BP:  Supine:   Sitting: 148/94  Standing:    SaO2:  1312-1425 Pt walked 700 ft with steady gait. Tolerated well. No CP. MI education completed. Stressed importance of brilinta with stent. Pt stated wife has brilinta card. Gave CHF booklet and reviewed zones. Pt knows importance of daily weights, watching sodium to 2000 mg. Reviewed MI restrictions, NTG use, low sodium and heart healthy diets, ex ed, and CRP 2. Discussed CRP 2 and will refer to [place]. Pt voiced understanding of all ed.   Graylon Good, RN BSN  01/21/2016 2:22 PM

## 2016-01-21 NOTE — Care Management Note (Signed)
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, dates discussed:    Additional Comments: gave pt 30day free and copay card for brilinta. Has [employer] ins.  Lacretia Leigh, RN 01/21/2016, 9:21 AM

## 2016-01-21 NOTE — Progress Notes (Signed)
  Echocardiogram 2D Echocardiogram has been performed with definity.  Aggie Cosier 01/21/2016, 9:50 AM

## 2016-01-22 ENCOUNTER — Encounter (HOSPITAL_COMMUNITY): Payer: Self-pay | Admitting: Emergency Medicine

## 2016-01-22 ENCOUNTER — Inpatient Hospital Stay (HOSPITAL_COMMUNITY)
Admission: EM | Admit: 2016-01-22 | Discharge: 2016-01-24 | DRG: 280 | Disposition: A | Discharge status: Home / Self Care | Payer: Commercial Managed Care - HMO | Attending: Internal Medicine | Admitting: Internal Medicine

## 2016-01-22 ENCOUNTER — Encounter (HOSPITAL_COMMUNITY): Payer: Self-pay | Admitting: Physician Assistant

## 2016-01-22 DIAGNOSIS — Z7901 Long term (current) use of anticoagulants: Secondary | ICD-10-CM

## 2016-01-22 DIAGNOSIS — I2109 ST elevation (STEMI) myocardial infarction involving other coronary artery of anterior wall: Principal | ICD-10-CM | POA: Diagnosis present

## 2016-01-22 DIAGNOSIS — R531 Weakness: Secondary | ICD-10-CM

## 2016-01-22 DIAGNOSIS — Z7982 Long term (current) use of aspirin: Secondary | ICD-10-CM

## 2016-01-22 DIAGNOSIS — I10 Essential (primary) hypertension: Secondary | ICD-10-CM | POA: Diagnosis present

## 2016-01-22 DIAGNOSIS — R55 Syncope and collapse: Secondary | ICD-10-CM

## 2016-01-22 DIAGNOSIS — E782 Mixed hyperlipidemia: Secondary | ICD-10-CM | POA: Diagnosis present

## 2016-01-22 DIAGNOSIS — I110 Hypertensive heart disease with heart failure: Secondary | ICD-10-CM | POA: Diagnosis present

## 2016-01-22 DIAGNOSIS — R42 Dizziness and giddiness: Secondary | ICD-10-CM

## 2016-01-22 DIAGNOSIS — I5042 Chronic combined systolic (congestive) and diastolic (congestive) heart failure: Secondary | ICD-10-CM | POA: Diagnosis present

## 2016-01-22 DIAGNOSIS — I080 Rheumatic disorders of both mitral and aortic valves: Secondary | ICD-10-CM | POA: Diagnosis present

## 2016-01-22 DIAGNOSIS — E7800 Pure hypercholesterolemia, unspecified: Secondary | ICD-10-CM | POA: Diagnosis present

## 2016-01-22 DIAGNOSIS — Z955 Presence of coronary angioplasty implant and graft: Secondary | ICD-10-CM

## 2016-01-22 DIAGNOSIS — I255 Ischemic cardiomyopathy: Secondary | ICD-10-CM | POA: Diagnosis present

## 2016-01-22 DIAGNOSIS — I5023 Acute on chronic systolic (congestive) heart failure: Secondary | ICD-10-CM | POA: Diagnosis present

## 2016-01-22 DIAGNOSIS — I5022 Chronic systolic (congestive) heart failure: Secondary | ICD-10-CM

## 2016-01-22 DIAGNOSIS — I639 Cerebral infarction, unspecified: Secondary | ICD-10-CM

## 2016-01-22 DIAGNOSIS — G459 Transient cerebral ischemic attack, unspecified: Secondary | ICD-10-CM

## 2016-01-22 DIAGNOSIS — I252 Old myocardial infarction: Secondary | ICD-10-CM

## 2016-01-22 DIAGNOSIS — I493 Ventricular premature depolarization: Secondary | ICD-10-CM | POA: Diagnosis present

## 2016-01-22 DIAGNOSIS — R5383 Other fatigue: Secondary | ICD-10-CM | POA: Diagnosis not present

## 2016-01-22 DIAGNOSIS — I2510 Atherosclerotic heart disease of native coronary artery without angina pectoris: Secondary | ICD-10-CM | POA: Diagnosis present

## 2016-01-22 LAB — I-STAT TROPONIN, ED: Troponin i, poc: 8.07 ng/mL (ref 0.00–0.08)

## 2016-01-22 LAB — BASIC METABOLIC PANEL
Anion gap: 7 (ref 5–15)
BUN: 18 mg/dL (ref 6–20)
CO2: 23 mmol/L (ref 22–32)
CREATININE: 1.04 mg/dL (ref 0.61–1.24)
Calcium: 9 mg/dL (ref 8.9–10.3)
Chloride: 105 mmol/L (ref 101–111)
GFR calc Af Amer: >60 mL/min (ref >60)
GLUCOSE: 135 mg/dL — AB (ref 65–99)
Potassium: 4.1 mmol/L (ref 3.5–5.1)
SODIUM: 135 mmol/L (ref 135–145)

## 2016-01-22 LAB — MAGNESIUM: MAGNESIUM: 2 mg/dL (ref 1.7–2.4)

## 2016-01-22 LAB — CBC
HEMATOCRIT: 48.1 % (ref 39.0–52.0)
Hemoglobin: 17.2 g/dL — ABNORMAL HIGH (ref 13.0–17.0)
MCH: 33.5 pg (ref 26.0–34.0)
MCHC: 35.8 g/dL (ref 30.0–36.0)
MCV: 93.8 fL (ref 78.0–100.0)
PLATELETS: 239 10*3/uL (ref 150–400)
RBC: 5.13 MIL/uL (ref 4.22–5.81)
RDW: 13.1 % (ref 11.5–15.5)
WBC: 15.5 10*3/uL — AB (ref 4.0–10.5)

## 2016-01-22 MED ORDER — CARVEDILOL 6.25 MG PO TABS: 18.7500 mg | ORAL_TABLET | Freq: Two times a day (BID) | ORAL | Status: DC

## 2016-01-22 MED ORDER — ROSUVASTATIN CALCIUM 10 MG PO TABS
40.0000 mg | ORAL_TABLET | Freq: Every day | ORAL | Status: DC
  Administered 2016-01-23 – 2016-01-24 (×2): 40 mg via ORAL
  Filled 2016-01-22 (×2): qty 4

## 2016-01-22 MED ORDER — TICAGRELOR 90 MG PO TABS: 90.0000 mg | ORAL_TABLET | Freq: Two times a day (BID) | ORAL | 0 refills | Status: DC

## 2016-01-22 MED ORDER — TICAGRELOR 90 MG PO TABS: 90.0000 mg | ORAL_TABLET | Freq: Two times a day (BID) | ORAL | 11 refills | Status: DC

## 2016-01-22 MED ORDER — ASPIRIN 81 MG PO CHEW: 81.0000 mg | CHEWABLE_TABLET | Freq: Every day | ORAL | Status: DC

## 2016-01-22 MED ORDER — HEPARIN SODIUM (PORCINE) 5000 UNIT/ML IJ SOLN
5000.0000 [IU] | Freq: Three times a day (TID) | INTRAMUSCULAR | Status: DC
  Administered 2016-01-23 – 2016-01-24 (×5): 5000 [IU] via SUBCUTANEOUS
  Filled 2016-01-22 (×5): qty 1

## 2016-01-22 MED ORDER — CARVEDILOL 6.25 MG PO TABS: 18.7500 mg | ORAL_TABLET | Freq: Two times a day (BID) | ORAL | 11 refills | Status: DC

## 2016-01-22 MED ORDER — NITROGLYCERIN 0.4 MG SL SUBL: 0.4000 mg | SUBLINGUAL_TABLET | SUBLINGUAL | Status: DC | PRN

## 2016-01-22 MED ORDER — LISINOPRIL 10 MG PO TABS
20.0000 mg | ORAL_TABLET | Freq: Every day | ORAL | Status: DC
  Administered 2016-01-23: 20 mg via ORAL
  Filled 2016-01-22: qty 2

## 2016-01-22 MED ORDER — SODIUM CHLORIDE 0.9% FLUSH
3.0000 mL | Freq: Two times a day (BID) | INTRAVENOUS | Status: DC
  Administered 2016-01-23 – 2016-01-24 (×4): 3 mL via INTRAVENOUS

## 2016-01-22 MED ORDER — TICAGRELOR 90 MG PO TABS
90.0000 mg | ORAL_TABLET | Freq: Two times a day (BID) | ORAL | Status: DC
  Administered 2016-01-23 – 2016-01-24 (×3): 90 mg via ORAL
  Filled 2016-01-22 (×3): qty 1

## 2016-01-22 MED ORDER — ASPIRIN 81 MG PO CHEW
81.0000 mg | CHEWABLE_TABLET | Freq: Every day | ORAL | Status: DC
  Administered 2016-01-23 – 2016-01-24 (×2): 81 mg via ORAL
  Filled 2016-01-22 (×2): qty 1

## 2016-01-22 MED ORDER — NITROGLYCERIN 0.4 MG SL SUBL: 0.4000 mg | SUBLINGUAL_TABLET | SUBLINGUAL | 3 refills | Status: DC | PRN

## 2016-01-22 MED ORDER — ROSUVASTATIN CALCIUM 40 MG PO TABS: 40.0000 mg | ORAL_TABLET | Freq: Every day | ORAL | 11 refills | Status: DC

## 2016-01-22 MED ORDER — POTASSIUM CHLORIDE CRYS ER 20 MEQ PO TBCR
40.0000 meq | EXTENDED_RELEASE_TABLET | Freq: Once | ORAL | Status: AC
  Administered 2016-01-22: 40 meq via ORAL
  Filled 2016-01-22: qty 2

## 2016-01-22 MED FILL — Heparin Sodium (Porcine) 2 Unit/ML in Sodium Chloride 0.9%: INTRAMUSCULAR | Qty: 1500 | Status: AC

## 2016-01-22 MED FILL — Verapamil HCl IV Soln 2.5 MG/ML: INTRAVENOUS | Qty: 2 | Status: AC

## 2016-01-22 NOTE — H&P (Addendum)
CARDIOLOGY INPATIENT HISTORY AND PHYSICAL EXAMINATION NOTE  Patient ID: Dwayne [name] MRN: [id number], DOB/AGE: May 17, 1956   Admit date: 01/22/2016   Primary Physician: Simona Huh, MD Primary Cardiologist: Mertie Moores MD  Reason for admission: Presyncope  HPI: This is a 60 y.o. WM with recent LAD STEMI  Successfully revascularized admitted on 8/23 and d/c'ed today developed presyncope and dizziness at home. He returned back and is being admitted for observation.  Pt said that he was at a restaurant took coreg 6.25 1.5 hours prior to going there. While sitting he started feeling weak, left arm became weak, started sweating, speech slurred. His wife called 77. He was given NTG tab under the tongue. He took an aspirin. When he came to the ED, his BP was 103/77. He had appropriate fluid intake today. He never had syncopal event.  He denied chest pain, dyspnea, PND, orthopnea, leg swelling, increased abdominal girth, syncope, presyncope, palpitations.. He continued to have bigemminy during hospital admission and was found to have low EF of 25%.   Problem List: Past Medical History:  Diagnosis Date  . Adenomatous colon polyp 2000  . Diverticulosis   . Erectile dysfunction   . Hypercholesteremia   . Hypertension   . Internal hemorrhoids   . Kidney stones   . Splenomegaly     Past Surgical History:  Procedure Laterality Date  . CARDIAC CATHETERIZATION N/A 01/20/2016   Procedure: Left Heart Cath and Coronary Angiography;  Surgeon: Burnell Blanks, MD; LAD 100%, D1 100%, multi-vessel dz 10-30% in other vessels, EF 25-35%, elevated LVEDP  . CARDIAC CATHETERIZATION N/A 01/20/2016   Procedure: Coronary Stent Intervention;  Surgeon: Burnell Blanks, MD; PROMUS PREM MR 3.5X16 mm DES LAD, D1 jailed by the stent but was already occluded  . CHOLECYSTECTOMY  2009  . EYE SURGERY     Age 72  . [place] SURGERY  1993  . REFRACTIVE SURGERY  2008  . SHOULDER SURGERY  1998   Left shoulder fracture-clavicle  . WRIST FRACTURE SURGERY  1995   Bilateral     Allergies:  Allergies  Allergen Reactions  . Codeine Nausea Only  . Lipitor [Atorvastatin] Other (See Comments)    Myalgia     Home Medications No current facility-administered medications for this encounter.    Current Outpatient Prescriptions  Medication Sig Dispense Refill  . aspirin 325 MG tablet Take 325 mg by mouth once.    Marland Kitchen aspirin 81 MG chewable tablet Chew 1 tablet (81 mg total) by mouth daily.    . carvedilol (COREG) 6.25 MG tablet Take 3 tablets (18.75 mg total) by mouth 2 (two) times daily with a meal. (Patient taking differently: Take 6.25 mg by mouth 2 (two) times daily with a meal. ) 90 tablet 11  . lisinopril (PRINIVIL,ZESTRIL) 20 MG tablet Take 10 mg by mouth daily.     . Multiple Vitamin (MULTIVITAMIN WITH MINERALS) TABS tablet Take 1 tablet by mouth daily.    . nitroGLYCERIN (NITROSTAT) 0.4 MG SL tablet Place 1 tablet (0.4 mg total) under the tongue every 5 (five) minutes as needed for chest pain. 25 tablet 3  . Omega-3 Fatty Acids (FISH OIL) 1000 MG CAPS Take 1,000 mg by mouth daily.    Marland Kitchen OVER THE COUNTER MEDICATION Take 1 capsule by mouth daily. Bile Acid factor    . OVER THE COUNTER MEDICATION Apply 1 application topically daily as needed (neck pain). CBD Oil    . rosuvastatin (CRESTOR) 40 MG tablet Take 1  tablet (40 mg total) by mouth daily at 6 PM. 30 tablet 11  . ticagrelor (BRILINTA) 90 MG TABS tablet Take 1 tablet (90 mg total) by mouth 2 (two) times daily. 60 tablet 11     Family History  Problem Relation Age of Onset  . Heart disease Father   . CVA Father   . CVA Brother      Social History   Social History  . Marital status: Married    Spouse name: N/A  . Number of children: 1  . Years of education: N/A   Occupational History  . Research Tech    Social History Main Topics  . Smoking status: Never Smoker  . Smokeless tobacco: Never Used  . Alcohol use  0.0 oz/week     Comment: 2+ per week  . Drug use: No  . Sexual activity: Not on file   Other Topics Concern  . Not on file   Social History Narrative  . No narrative on file     Review of Systems: General: negative for chills, fever, night sweats or weight changes.  Cardiovascular: negative for dyspnea on exertion, edema, orthopnea, palpitations, paroxysmal nocturnal dyspnea or shortness of breath  Dermatological: negative for rash Respiratory: negative for cough or wheezing Urologic: negative for hematuria Abdominal: negative for nausea, vomiting, diarrhea, bright red blood per rectum, melena, or hematemesis Neurologic: dizziness, left arm weakness Endocrine: no diabetes, no hypothyroidism Immunological: no lymph adenopathy Psych: non homicidal/suicidal  Physical Exam: Vitals: BP 96/64   Pulse 77   Temp 97.8 F (36.6 C) (Oral)   Resp 23   Ht 5\' 10"  (1.778 m)   Wt 98.9 kg (218 lb)   SpO2 93%   BMI 31.28 kg/m  General: not in acute distress Neck: JVP flat, neck supple Heart: regular rate and rhythm, S1, S2, no murmurs  Lungs: CTAB  GI: non tender, non distended, bowel sounds present Extremities: no edema Neuro: AAO x 3, left upper extremity 4+/5, rest of the extremities 5/5 in strength Psych: normal affect, no anxiety   Labs:   Results for orders placed or performed during the hospital encounter of 01/22/16 (from the past 24 hour(s))  Basic metabolic panel     Status: Abnormal   Collection Time: 01/22/16  8:30 PM  Result Value Ref Range   Sodium 135 135 - 145 mmol/L   Potassium 4.1 3.5 - 5.1 mmol/L   Chloride 105 101 - 111 mmol/L   CO2 23 22 - 32 mmol/L   Glucose, Bld 135 (H) 65 - 99 mg/dL   BUN 18 6 - 20 mg/dL   Creatinine, Ser 1.04 0.61 - 1.24 mg/dL   Calcium 9.0 8.9 - 10.3 mg/dL   GFR calc non Af Amer >60 >60 mL/min   GFR calc Af Amer >60 >60 mL/min   Anion gap 7 5 - 15  CBC     Status: Abnormal   Collection Time: 01/22/16  8:30 PM  Result Value Ref  Range   WBC 15.5 (H) 4.0 - 10.5 K/uL   RBC 5.13 4.22 - 5.81 MIL/uL   Hemoglobin 17.2 (H) 13.0 - 17.0 g/dL   HCT 48.1 39.0 - 52.0 %   MCV 93.8 78.0 - 100.0 fL   MCH 33.5 26.0 - 34.0 pg   MCHC 35.8 30.0 - 36.0 g/dL   RDW 13.1 11.5 - 15.5 %   Platelets 239 150 - 400 K/uL  I-stat troponin, ED     Status: Abnormal   Collection  Time: 01/22/16  8:43 PM  Result Value Ref Range   Troponin i, poc 8.07 (HH) 0.00 - 0.08 ng/mL   Comment NOTIFIED PHYSICIAN    Comment 3             Radiology/Studies: Mr Cervical Spine Wo Contrast  Result Date: 01/05/2016 CLINICAL DATA:  Neck pain, right shoulder pain and numbness and tingling in right arm. EXAM: MRI CERVICAL SPINE WITHOUT CONTRAST TECHNIQUE: Multiplanar, multisequence MR imaging of the cervical spine was performed. No intravenous contrast was administered. COMPARISON:  Cervical spine radiographs 12/07/2015 FINDINGS: Alignment: Degenerative cervical spondylosis with multilevel disc disease and facet disease but overall normal alignment. Vertebrae: Endplate reactive changes but no fracture or worrisome bone lesions. Cord: No cord lesions or syrinx. Posterior Fossa, vertebral arteries, paraspinal tissues: No significant findings. Disc levels: C1-2: Mild degenerative changes with pannus formation but no significant mass effect on the upper cervical cord. C2-3:  No significant findings. C3-4: Broad-based disc protrusion, osteophytic ridging and uncinate spurring with mass effect on the ventral thecal sac and narrowing of the ventral CSF space. There is also mild bilateral foraminal stenosis. C4-5: Mild annular bulge, osteophytic ridging and uncinate spurring. There is asymmetric right-sided facet disease contributing to moderate right foraminal stenosis. No spinal stenosis. Mild left foraminal narrowing. C5-6: Broad-based disc protrusion, osteophytic ridging, uncinate spurring and facet disease contributing to mild spinal stenosis and moderate bilateral foraminal  stenosis. C6-7: Diffuse bulging degenerated annulus, osteophytic ridging, uncinate spurring and facet disease contributing to mild spinal stenosis and moderate bilateral foraminal stenosis. C7-T1:  No significant findings. IMPRESSION: 1. Degenerative cervical spondylosis with multilevel disc disease and facet disease. 2. Spinal and bilateral foraminal stenosis at C5-6 and C6-7. 3. Mild bilateral foraminal stenosis at C3-4 and moderate right foraminal stenosis at C4-5. Electronically Signed   By: Marijo Sanes M.D.   On: 01/05/2016 10:38   EKG: normal sinus rhythm 75 bpm, serial changes of anterior/anteroseptal infarct   Cardiac cath: 01/20/2016  Mid RCA lesion, 10 %stenosed.  Ost 1st Mrg to 1st Mrg lesion, 20 %stenosed.  2nd Mrg lesion, 20 %stenosed.  Mid Cx lesion, 10 %stenosed.  Prox Cx to Mid Cx lesion, 10 %stenosed.  Ost LM to LM lesion, 30 %stenosed.  Ost 1st Diag to 1st Diag lesion, 100 %stenosed.  A STENT PROMUS PREM MR 3.5X16 drug eluting stent was successfully placed. D1 was jailed  Prox LAD lesion, 100 %stenosed.  Post intervention, there is a 0% residual stenosis.  Mid LAD lesion, 20 %stenosed.  There is moderate left ventricular systolic dysfunction.  LV end diastolic pressure is mildly elevated.  The left ventricular ejection fraction is 25-35% by visual estimate.  There is no mitral valve regurgitation. 1. Acute anterior STEMI secondary to occluded proximal LAD 2. Successful PTCA/DES x 1 proximal to mid LAD 3. Mild disease in the left main, RCA and Circumflex 4. Moderate segmental LV systolic dysfunction Recommendations: Will continue DAPT for one year with ASA and Brilinta. Will start beta blocker and change statin to Crestor. Will continue Ace-inh.  ECHO: 01/21/2016 - Left ventricle: The cavity size was normal. Wall thickness was increased in a pattern of mild LVH. Systolic function was moderately to severely reduced. The estimated ejection fraction  was in the range of 30% to 35%. - Aortic valve: There was mild regurgitation. - Mitral valve: There was mild regurgitation. - Impressions: Patient with ventricular bigeminy throughout study. Impressions: - Patient with ventricular bigeminy throughout study.  Medical decision making:  Discussed care with  the patient and family Discussed care with the physician on the phone Reviewed labs and imaging personally Reviewed prior records  ASSESSMENT AND PLAN:  This is a 60 y.o. WM w/recent LAD STEMI which was revascularized and developed transient left sided weakness and presyncope who presented to ED for further evaluation.    Active Problems:   Hypertension   Hypercholesteremia   Chronic systolic CHF (congestive heart failure), NYHA class 1 (HCC)   Dizziness  Left sided weakness - could be related to low BP, provisional dx = TIA/CVA Ordered MRI brain, neck and carotid ultrasound - neurochecks  Presyncope/dizziness Differential includes VT, orthostatic due to bp meds - probably lifewatch at discharge for monitoring NSVT - will lower bp meds, coreg to 6.25 mg BID from 18.75 BID and lisinopril to 10 mg daily from 20 mg daily due to low bp (current BP is 86/53) -Keep potassium >4, calcium >9, magnesium >2  Stable ischemic heart disease s/p revascularization - continue aspirin, brilinta, high dose statin, bb  Mixed hyperlipidemia - on statin   Chronic systolic heart failure  - euvolemic, NYHA class I - continue aspirin, high dose statin, bb and acei   Signed, Flossie Dibble, MD MS 01/22/2016, 11:27 PM

## 2016-01-22 NOTE — Discharge Summary (Signed)
Discharge Summary    Patient ID: Dwayne [name],  MRN: [id number], DOB/AGE: [birth year] 60 y.o.  Admit date: 01/20/2016 Discharge date: 01/22/2016  Primary Care Provider: Gaynelle Arabian R Primary Cardiologist: Dr Angelena Form  Discharge Diagnoses    Principal Problem:   Acute ST elevation myocardial infarction (STEMI) involving left anterior descending (LAD) coronary artery [facility]) Active Problems:   Hypertension   Hypercholesteremia   Acute systolic CHF (congestive heart failure) (HCC)   Allergies Allergies  Allergen Reactions  . Codeine Nausea Only  . Lipitor [Atorvastatin]     Myalgia    Diagnostic Studies/Procedures    CATH: 08/23  Mid RCA lesion, 10 %stenosed.  Ost 1st Mrg to 1st Mrg lesion, 20 %stenosed.  2nd Mrg lesion, 20 %stenosed.  Mid Cx lesion, 10 %stenosed.  Prox Cx to Mid Cx lesion, 10 %stenosed.  Ost LM to LM lesion, 30 %stenosed.  Ost 1st Diag to 1st Diag lesion, 100 %stenosed.  A STENT PROMUS PREM MR 3.5X16 drug eluting stent was successfully placed. D1 was jailed  Prox LAD lesion, 100 %stenosed.  Post intervention, there is a 0% residual stenosis.  Mid LAD lesion, 20 %stenosed.  There is moderate left ventricular systolic dysfunction.  LV end diastolic pressure is mildly elevated.  The left ventricular ejection fraction is 25-35% by visual estimate.  There is no mitral valve regurgitation.  1. Acute anterior STEMI secondary to occluded proximal LAD 2. Successful PTCA/DES x 1 proximal to mid LAD 3. Mild disease in the left main, RCA and Circumflex 4. Moderate segmental LV systolic dysfunction Recommendations: Will continue DAPT for one year with ASA and Brilinta. Will start beta blocker and change statin to Crestor. Will continue Ace-inh.  ECHO: 08/24 - Left ventricle: The cavity size was normal. Wall thickness was   increased in a pattern of mild LVH. Systolic function was   moderately to severely reduced. The estimated ejection  fraction   was in the range of 30% to 35%. - Aortic valve: There was mild regurgitation. - Mitral valve: There was mild regurgitation. - Impressions: Patient with ventricular bigeminy throughout study. Impressions: - Patient with ventricular bigeminy throughout study. _____________   History of Present Illness     60 yo male with history of HTN, HLD, was admitted 08/23 w/ ant STEMI.  Hospital Course     Consultants: None   He was taken directly to the Cath Lab. Cardiac catheterization results are above. His occiput LAD was occluded and the ostial first diagonal was occluded as well. The LAD was opened and a drug-eluting stent was placed. The diagonal was already occluded and is jailed by the stent. EF was 25-35 percent at cath.  He tolerated the procedure well. He was continued on his home dose of lisinopril, and carvedilol was added as well as high-dose Crestor, aspirin, and Brilinta.  He was noted to have some mild heart failure by exam. He was started on medications for his ischemic cardiomyopathy and did not require IV Lasix. He is encouraged to stick to a low sodium diet and contact us if he has problems with edema or shortness of breath.  He was seen by cardiac rehabilitation and educated on MI restrictions, exercise guidelines, and heart healthy lifestyle modifications. He was referred to outpatient cardiac rehabilitation and is encouraged to attend.  On 01/22/2016, he was seen by Dr. Acie Fredrickson and all data were reviewed. He ambulated 900 feet with cardiac rehabilitation and did well with this. No further inpatient workup was indicated  and he is considered stable for discharge, to follow up as an outpatient.  _____________  Discharge Vitals Blood pressure 106/72, pulse 75, temperature 97.9 F (36.6 C), temperature source Oral, resp. rate 16, height 5\' 10"  (1.778 m), weight 224 lb 10.4 oz (101.9 kg), SpO2 96 %.  Filed Weights   01/20/16 1732  Weight: 224 lb 10.4 oz (101.9 kg)      Labs & Radiologic Studies    CBC  Recent Labs  01/20/16 1615 01/21/16 0658  WBC 19.9* 18.0*  HGB 18.0*  18.6* 17.7*  HCT 53.0*  51.3 49.7  MCV 94.8 93.6  PLT 301 [id number]   Basic Metabolic Panel  Recent Labs  01/20/16 1615 01/21/16 0658 01/22/16 0942  NA 141  138 135  --   K 3.8  3.7 3.6  --   CL 96*  99* 100*  --   CO2 30 26  --   GLUCOSE 117*  120* 96  --   BUN 19  15 14   --   CREATININE 1.10  1.22 0.99  --   CALCIUM 9.5 8.6*  --   MG  --   --  2.0   Liver Function Tests  Recent Labs  01/20/16 1615  AST 39  ALT 48  ALKPHOS 70  BILITOT 1.1  PROT 7.1  ALBUMIN 4.2   Cardiac Enzymes  Recent Labs  01/20/16 1615 01/20/16 1822 01/20/16 2255 01/21/16 0658  CKTOTAL 54  --   --   --   CKMB 2.6  --   --   --   TROPONINI  --  2.27* 19.41* 23.15*   Hemoglobin A1C  Recent Labs  01/20/16 1615  HGBA1C 5.1   Fasting Lipid Panel  Recent Labs  01/20/16 1615  CHOL 216*  HDL 42  LDLCALC 139*  TRIG 175*  CHOLHDL 5.1  _____________   Disposition   Pt is being discharged home today in good condition.  Follow-up Plans & Appointments    Follow-up Information    [facility] Follow up on 02/02/2016.   Why:  See Pharmacist at 11:45 am. Contact information: [place] [phone]       Ermalinda Barrios, PA-C Follow up on 02/02/2016.   Specialty:  Cardiology Why:  See provider for Dr Acie Fredrickson at 12:15 pm. Contact information: [address] [place] [address] [phone]          Discharge Instructions    Amb Referral to Cardiac Rehabilitation    Complete by:  As directed   Diagnosis:   STEMI Coronary Stents     Diet - low sodium heart healthy    Complete by:  As directed   Increase activity slowly    Complete by:  As directed      Discharge Medications   Current Discharge Medication List    START taking these medications   Details  aspirin 81 MG chewable tablet  Chew 1 tablet (81 mg total) by mouth daily.    carvedilol (COREG) 6.25 MG tablet Take 3 tablets (18.75 mg total) by mouth 2 (two) times daily with a meal. Qty: 90 tablet, Refills: 11    nitroGLYCERIN (NITROSTAT) 0.4 MG SL tablet Place 1 tablet (0.4 mg total) under the tongue every 5 (five) minutes as needed for chest pain. Qty: 25 tablet, Refills: 3    rosuvastatin (CRESTOR) 40 MG tablet Take 1 tablet (40 mg total) by mouth daily at 6 PM. Qty: 30 tablet, Refills:  11    ticagrelor (BRILINTA) 90 MG TABS tablet Take 1 tablet (90 mg total) by mouth 2 (two) times daily. Qty: 60 tablet, Refills: 11      CONTINUE these medications which have NOT CHANGED   Details  lisinopril (PRINIVIL,ZESTRIL) 20 MG tablet Take 20 mg by mouth daily.      STOP taking these medications     etodolac (LODINE) 400 MG tablet          Aspirin prescribed at discharge?  Yes High Intensity Statin Prescribed? (Lipitor 40-80mg  or Crestor 20-40mg ): Yes Beta Blocker Prescribed? Yes For EF <40%, was ACEI/ARB Prescribed? Yes ADP Receptor Inhibitor Prescribed? (i.e. Plavix etc.-Includes Medically Managed Patients): Yes For EF <40%, Aldosterone Inhibitor Prescribed? No: BP Was EF assessed during THIS hospitalization? Yes Was Cardiac Rehab II ordered? (Included Medically managed Patients): Yes   Outstanding Labs/Studies   None  Duration of Discharge Encounter   Greater than 30 minutes including physician time.  Jonetta Speak NP 01/22/2016, 12:01 PM  Attending Note:   The patient was seen and examined.  Agree with assessment and plan as noted above.  Changes made to the above note as needed.  Patient seen and independently examined with Rosaria Ferries, PA.   We discussed all aspects of the encounter. I agree with the assessment and plan as stated above.  See progress note from day of discharge   I have spent a total of 40 minutes with patient reviewing hospital  notes , telemetry, EKGs, labs  and examining patient as well as establishing an assessment and plan that was discussed with the patient. > 50% of time was spent in direct patient care.    Thayer Headings, Brooke Bonito., MD, [facility] 01/26/2016, 10:36 AM [address]  [place] Pager [phone]

## 2016-01-22 NOTE — ED Triage Notes (Signed)
Per EMS: Pt was at [place] [facility] with family drinking water. Pt started to feel weak and like he was going to pass out. Pt denied CP. Pt was cool, clammy, diaphoretic, nauseated. Denies V. Pt a stent placed 2 days ago (23rd) for MI. Pt denies CP at this time. Pt not cool, clammy, diaphoretic. Some N still.

## 2016-01-22 NOTE — Progress Notes (Signed)
Patient Name: Dwayne [name] Date of Encounter: 01/22/2016  Principal Problem:   Acute ST elevation myocardial infarction (STEMI) involving left anterior descending (LAD) coronary artery [facility]) Active Problems:   Hypertension   Hypercholesteremia   Primary Cardiologist: Dr Angelena Form  Patient Profile: 60 yo male with history of HTN, HLD, was admitted 08/23 w/ ant STEMI, s/p PROMUS PREM MR 3.5X16 DES LAD, D1 100% and was jailed by stent, EF 25-35%  SUBJECTIVE: No chest pain, no SOB, feels well.  OBJECTIVE Vitals:   01/21/16 1709 01/21/16 1923 01/22/16 0505 01/22/16 0800  BP: (!) 122/92 121/72 130/83 130/84  Pulse: 86 75 74 75  Resp: 17 16 16    Temp: 98.2 F (36.8 C) 98 F (36.7 C) 97.9 F (36.6 C)   TempSrc: Oral Oral Oral   SpO2: 98% 99% 96%   Weight:      Height:        Intake/Output Summary (Last 24 hours) at 01/22/16 [id number] Last data filed at 01/22/16 0830  Gross per 24 hour  Intake              480 ml  Output                0 ml  Net              480 ml   Filed Weights   01/20/16 1732  Weight: 224 lb 10.4 oz (101.9 kg)    PHYSICAL EXAM General: Well developed, well nourished, male in no acute distress. Head: Normocephalic, atraumatic.  Neck: Supple without bruits, JVD not elevated Lungs:  Resp regular and unlabored, CTA. Heart: RRR, S1, S2, no S3, S4, faint murmur; no rub. Abdomen: Soft, non-tender, non-distended, BS + x 4.  Extremities: No clubbing, cyanosis, edema.  Neuro: Alert and oriented X 3. Moves all extremities spontaneously. Psych: Normal affect.  LABS: CBC:  Recent Labs  01/20/16 1615 01/21/16 0658  WBC 19.9* 18.0*  HGB 18.0*  18.6* 17.7*  HCT 53.0*  51.3 49.7  MCV 94.8 93.6  PLT 301 232   INR:  Recent Labs  01/21/16 0800  INR [id number]   Basic Metabolic Panel:  Recent Labs  01/20/16 1615 01/21/16 0658  NA 141  138 135  K 3.8  3.7 3.6  CL 96*  99* 100*  CO2 30 26  GLUCOSE 117*  120* 96  BUN 19  15 14     CREATININE 1.10  1.22 0.99  CALCIUM 9.5 8.6*   Liver Function Tests:  Recent Labs  01/20/16 1615  AST 39  ALT 48  ALKPHOS 70  BILITOT 1.1  PROT 7.1  ALBUMIN 4.2   Cardiac Enzymes:  Recent Labs  01/20/16 1615 01/20/16 1822 01/20/16 2255 01/21/16 0658  CKTOTAL 54  --   --   --   CKMB 2.6  --   --   --   TROPONINI  --  2.27* 19.41* 23.15*   Hemoglobin A1C:  Recent Labs  01/20/16 1615  HGBA1C 5.1   Fasting Lipid Panel:  Recent Labs  01/20/16 1615  CHOL 216*  HDL 42  LDLCALC 139*  TRIG 175*  CHOLHDL 5.1   TELE:  SR, freq PVCs, pairs and occ triplets    ECHO: 08/24  - Left ventricle: The cavity size was normal. Wall thickness was   increased in a pattern of mild LVH. Systolic function was   moderately to severely reduced. The estimated ejection fraction   was in the range of  30% to 35%. - Aortic valve: There was mild regurgitation. - Mitral valve: There was mild regurgitation. - Impressions: Patient with ventricular bigeminy throughout study. Impressions: - Patient with ventricular bigeminy throughout study.   Radiology/Studies: No results found.   Current Medications:  . aspirin  81 mg Oral Daily  . carvedilol  12.5 mg Oral BID WC  . lisinopril  20 mg Oral Daily  . rosuvastatin  40 mg Oral q1800  . sodium chloride flush  3 mL Intravenous Q12H  . ticagrelor  90 mg Oral BID      ASSESSMENT AND PLAN: Principal Problem:   Acute ST elevation myocardial infarction (STEMI) involving left anterior descending (LAD) coronary artery (HCC) - s/p DES LAD, D1 100% - ICM w/ EF 30% - on ASA, BB, statin, ACE - with vent bigeminy, will increased BB and ck Mg, supp K+ to get closer to 4.0  Active Problems:   Hypertension - BP borderline high, think ok to increase BB    Hypercholesteremia - on high-dose statin  Plan: d/c when medically stable, possibly in am.  Signed, Lenoard Aden 9:25 AM 01/22/2016   Attending Note:   The  patient was seen and examined.  Agree with assessment and plan as noted above.  Changes made to the above note as needed.  Patient seen and independently examined with Rosaria Ferries, PA .   We discussed all aspects of the encounter. I agree with the assessment and plan as stated above.  1.  CAD / ant. MI Pt was admitted with an anterior STEMI on 01/20/16. Had PCI of his prox LAD Has done well. No further angina .  2. Acute systolic CHF:   He is clinically doing well Does not have any CHF symptoms. contniue Lisinopril and Coreg  Will have him see a PA in our office in a week.   I'l follow up in several weeks / months    I have spent a total of 40 minutes with patient reviewing hospital  notes , telemetry, EKGs, labs and examining patient as well as establishing an assessment and plan that was discussed with the patient. > 50% of time was spent in direct patient care.    Thayer Headings, Brooke Bonito., MD, [facility] 01/22/2016, 10:44 AM [address]  [place] Pager [phone]

## 2016-01-22 NOTE — Progress Notes (Signed)
Patient to D/C home with wife. Personal belongings given to patient. IV removed. Tele monitor removed. CCMD notified. Handout given. Patient and wife have no further questions. Patient to D/c home with wife.  Domingo Dimes RN

## 2016-01-22 NOTE — ED Provider Notes (Signed)
[facility] DEPT Provider Note   CSN: [id number] Arrival date & time: 01/22/16  2024     History   Chief Complaint Chief Complaint  Patient presents with  . Weakness  . Near Syncope    HPI PRYNCETON HOLTZINGER is a 60 y.o. male.   Weakness  Pertinent negatives include no chest pain, no abdominal pain and no shortness of breath.  Near Syncope  Pertinent negatives include no chest pain, no abdominal pain and no shortness of breath.   Pt had an LAD stent placed on Wed.  He was released from the hospital today.  He was at home this evening at the restaurant he started to feel weak.  He also felt sweaty.  He had a near syncopal episode.  He could not speak. He remained siting in the booth.  This lasted for 5 minutes.  EMS called and sx resolved.  He denies any chest pain.  He took a ntg and asa but he did not have any pain.  No sx now at all.     Past Medical History:  Diagnosis Date  . Adenomatous colon polyp 2000  . Diverticulosis   . Erectile dysfunction   . Hypercholesteremia   . Hypertension   . Internal hemorrhoids   . Kidney stones   . Splenomegaly     Patient Active Problem List   Diagnosis Date Noted  . Acute systolic CHF (congestive heart failure) ([place]) 01/22/2016  . Syncope 01/22/2016  . Hypertension   . Hypercholesteremia   . Acute ST elevation myocardial infarction (STEMI) involving left anterior descending (LAD) coronary artery ([place])   . Hx of adenomatous colonic polyps 04/14/2014    Past Surgical History:  Procedure Laterality Date  . CARDIAC CATHETERIZATION N/A 01/20/2016   Procedure: Left Heart Cath and Coronary Angiography;  Surgeon: Burnell Blanks, MD; LAD 100%, D1 100%, multi-vessel dz 10-30% in other vessels, EF 25-35%, elevated LVEDP  . CARDIAC CATHETERIZATION N/A 01/20/2016   Procedure: Coronary Stent Intervention;  Surgeon: Burnell Blanks, MD; PROMUS PREM MR 3.5X16 mm DES LAD, D1 jailed by the stent but was already occluded  .  CHOLECYSTECTOMY  2009  . EYE SURGERY     Age 52  . [place] SURGERY  1993  . REFRACTIVE SURGERY  2008  . SHOULDER SURGERY  1998   Left shoulder fracture-clavicle  . WRIST FRACTURE SURGERY  1995   Bilateral       Home Medications    Prior to Admission medications   Medication Sig Start Date End Date Taking? Authorizing Provider  aspirin 325 MG tablet Take 325 mg by mouth once.   Yes Historical Provider, MD  aspirin 81 MG chewable tablet Chew 1 tablet (81 mg total) by mouth daily. 01/22/16  Yes Rhonda G Barrett, PA-C  carvedilol (COREG) 6.25 MG tablet Take 3 tablets (18.75 mg total) by mouth 2 (two) times daily with a meal. 01/22/16  Yes Rhonda G Barrett, PA-C  lisinopril (PRINIVIL,ZESTRIL) 20 MG tablet Take 20 mg by mouth daily.   Yes Historical Provider, MD  Multiple Vitamin (MULTIVITAMIN WITH MINERALS) TABS tablet Take 1 tablet by mouth daily.   Yes Historical Provider, MD  nitroGLYCERIN (NITROSTAT) 0.4 MG SL tablet Place 1 tablet (0.4 mg total) under the tongue every 5 (five) minutes as needed for chest pain. 01/22/16  Yes Rhonda G Barrett, PA-C  Omega-3 Fatty Acids (FISH OIL) 1000 MG CAPS Take 1,000 mg by mouth daily.   Yes Historical Provider, MD  OVER  THE COUNTER MEDICATION Take 1 capsule by mouth daily. Bile Acid factor   Yes Historical Provider, MD  OVER THE COUNTER MEDICATION Apply 1 application topically daily as needed (neck pain). CBD Oil   Yes Historical Provider, MD  rosuvastatin (CRESTOR) 40 MG tablet Take 1 tablet (40 mg total) by mouth daily at 6 PM. 01/22/16  Yes Rhonda G Barrett, PA-C  ticagrelor (BRILINTA) 90 MG TABS tablet Take 1 tablet (90 mg total) by mouth 2 (two) times daily. 01/22/16  Yes Rhonda G Barrett, PA-C    Family History Family History  Problem Relation Age of Onset  . Heart disease Father   . CVA Father   . CVA Brother     Social History Social History  Substance Use Topics  . Smoking status: Never Smoker  . Smokeless tobacco: Never Used  .  Alcohol use 0.0 oz/week     Comment: 2+ per week     Allergies   Codeine and Lipitor [atorvastatin]   Review of Systems Review of Systems  Respiratory: Negative for shortness of breath.   Cardiovascular: Positive for near-syncope. Negative for chest pain.  Gastrointestinal: Negative for abdominal pain.  Neurological: Positive for weakness.  All other systems reviewed and are negative.    Physical Exam Updated Vital Signs BP 99/72   Pulse 77   Temp 97.8 F (36.6 C) (Oral)   Resp 20   Ht 5\' 10"  (1.778 m)   Wt 98.9 kg   SpO2 92%   BMI 31.28 kg/m   Physical Exam  Constitutional: He appears well-developed and well-nourished. No distress.  HENT:  Head: Normocephalic and atraumatic.  Right Ear: External ear normal.  Left Ear: External ear normal.  Eyes: Conjunctivae are normal. Right eye exhibits no discharge. Left eye exhibits no discharge. No scleral icterus.  Neck: Neck supple. No tracheal deviation present.  Cardiovascular: Normal rate, regular rhythm and intact distal pulses.   Pulmonary/Chest: Effort normal and breath sounds normal. No stridor. No respiratory distress. He has no wheezes. He has no rales.  Abdominal: Soft. Bowel sounds are normal. He exhibits no distension. There is no tenderness. There is no rebound and no guarding.  Musculoskeletal: He exhibits no edema or tenderness.  Neurological: He is alert. He has normal strength. No cranial nerve deficit (no facial droop, extraocular movements intact, no slurred speech) or sensory deficit. He exhibits normal muscle tone. He displays no seizure activity. Coordination normal.  Skin: Skin is warm and dry. No rash noted.  Psychiatric: He has a normal mood and affect.  Nursing note and vitals reviewed.    ED Treatments / Results  Labs (all labs ordered are listed, but only abnormal results are displayed) Labs Reviewed  BASIC METABOLIC PANEL - Abnormal; Notable for the following:       Result Value   Glucose,  Bld 135 (*)    All other components within normal limits  CBC - Abnormal; Notable for the following:    WBC 15.5 (*)    Hemoglobin 17.2 (*)    All other components within normal limits  I-STAT TROPOININ, ED - Abnormal; Notable for the following:    Troponin i, poc 8.07 (*)    All other components within normal limits    EKG  EKG Interpretation  Date/Time:  Friday January 22 2016 20:24:55 EDT Ventricular Rate:  75 PR Interval:    QRS Duration: 91 QT Interval:  423 QTC Calculation: 473 R Axis:   88 Text Interpretation:  Sinus rhythm Anteroseptal  infarct, age indeterminate Lateral leads are also involved lateral changes are new since prior tracing anteroseptal changes are similar to prior ECG Confirmed by Kerim Statzer  MD-J, Shelena Castelluccio [id number]) on 01/22/2016 8:34:58 PM       Radiology No results found.  Procedures Procedures (including critical care time)  Medications Ordered in ED Medications - No data to display   Initial Impression / Assessment and Plan / ED Course  I have reviewed the triage vital signs and the nursing notes.  Pertinent labs & imaging results that were available during my care of the patient were reviewed by me and considered in my medical decision making (see chart for details).  Clinical Course  Comment By Time  Troponin is elevated but decreasing from his recent levels in the 20s Dorie Rank, MD 08/25 2222    Pt presents with near syncope after recent STEMI and LAD stent.  Troponin is elevated but decreasing from previous levels.  No chest pain.  Concerned about possible Arrhythmia but could be related to his borderline blood pressures. Consult cardiology service for admission and monitoring.  Final Clinical Impressions(s) / ED Diagnoses   Final diagnoses:  Near syncope      Dorie Rank, MD 01/22/16 2226

## 2016-01-22 NOTE — ED Notes (Signed)
Labs were collected at 2030, not 1930

## 2016-01-22 NOTE — Progress Notes (Signed)
Per insurance check for Kary Kos [facility] @ [facility] # [id number]   BRILINTA  90 MG BID ( 30 )   COVER- YES  CO-PAY- $18.00 60 TAB  TIER- 1 DRUG  PRIOR APPROVAL - NO  PHARMACY : [facility]   MAIL-ORDER AND RETIAL 90 DAY SUPPLY FOR 180 TAB $ 36.00

## 2016-01-22 NOTE — ED Notes (Signed)
Dr. Tomi Bamberger and Nurse Lennette Bihari B was notified about the critical result (KT)

## 2016-01-22 NOTE — Discharge Instructions (Signed)
PLEASE REMEMBER TO BRING ALL OF YOUR MEDICATIONS TO EACH OF YOUR FOLLOW-UP OFFICE VISITS. ° °PLEASE ATTEND ALL SCHEDULED FOLLOW-UP APPOINTMENTS.  ° °Activity: Increase activity slowly as tolerated. You may shower, but no soaking baths (or swimming) for 1 week. No driving for 1 week. No lifting over 5 lbs for 2 weeks. No sexual activity for 1 week.  ° °You May Return to Work: in 3 weeks (if applicable) ° °Wound Care: You may wash cath site gently with soap and water. Keep cath site clean and dry. If you notice pain, swelling, bleeding or pus at your cath site, please call [phone]. ° ° ° °Cardiac Cath Site Care °Refer to this sheet in the next few weeks. These instructions provide you with information on caring for yourself after your procedure. Your caregiver may also give you more specific instructions. Your treatment has been planned according to current medical practices, but problems sometimes occur. Call your caregiver if you have any problems or questions after your procedure. °HOME CARE INSTRUCTIONS °· You may shower 24 hours after the procedure. Remove the bandage (dressing) and gently wash the site with plain soap and water. Gently pat the site dry.  °· Do not apply powder or lotion to the site.  °· Do not sit in a bathtub, swimming pool, or whirlpool for 5 to 7 days.  °· No bending, squatting, or lifting anything over 10 pounds (4.5 kg) as directed by your caregiver.  °· Inspect the site at least twice daily.  °· Do not drive home if you are discharged the same day of the procedure. Have someone else drive you.  °· You may drive 24 hours after the procedure unless otherwise instructed by your caregiver.  °What to expect: °· Any bruising will usually fade within 1 to 2 weeks.  °· Blood that collects in the tissue (hematoma) may be painful to the touch. It should usually decrease in size and tenderness within 1 to 2 weeks.  °SEEK IMMEDIATE MEDICAL CARE IF: °· You have unusual pain at the site or down the  affected limb.  °· You have redness, warmth, swelling, or pain at the site.  °· You have drainage (other than a small amount of blood on the dressing).  °· You have chills.  °· You have a fever or persistent symptoms for more than 72 hours.  °· You have a fever and your symptoms suddenly get worse.  °· Your leg becomes pale, cool, tingly, or numb.  °· You have heavy bleeding from the site. Hold pressure on the site.  °Document Released: 06/18/2010 Document Revised: 05/05/2011 Document Reviewed:  ° °

## 2016-01-22 NOTE — Progress Notes (Signed)
CARDIAC REHAB PHASE I   PRE:  Rate/Rhythm: 82 SR    BP: sitting 135/85    SaO2: 95 RA  MODE:  Ambulation: 900 ft   POST:  Rate/Rhythm: 106 ST with PVCs    BP: sitting 136/88     SaO2:   Pt feels great. No c/o walking. Did have some PVCs toward end, asx. No questions regarding education, did review. [place], ACSM 01/22/2016 11:27 AM

## 2016-01-23 ENCOUNTER — Observation Stay (HOSPITAL_COMMUNITY): Payer: Commercial Managed Care - HMO

## 2016-01-23 DIAGNOSIS — E7800 Pure hypercholesterolemia, unspecified: Secondary | ICD-10-CM | POA: Diagnosis not present

## 2016-01-23 DIAGNOSIS — R55 Syncope and collapse: Secondary | ICD-10-CM | POA: Diagnosis present

## 2016-01-23 DIAGNOSIS — R42 Dizziness and giddiness: Secondary | ICD-10-CM | POA: Diagnosis not present

## 2016-01-23 DIAGNOSIS — I2510 Atherosclerotic heart disease of native coronary artery without angina pectoris: Secondary | ICD-10-CM | POA: Diagnosis present

## 2016-01-23 DIAGNOSIS — Z955 Presence of coronary angioplasty implant and graft: Secondary | ICD-10-CM | POA: Diagnosis not present

## 2016-01-23 DIAGNOSIS — E782 Mixed hyperlipidemia: Secondary | ICD-10-CM | POA: Diagnosis present

## 2016-01-23 DIAGNOSIS — I255 Ischemic cardiomyopathy: Secondary | ICD-10-CM | POA: Diagnosis present

## 2016-01-23 DIAGNOSIS — I63432 Cerebral infarction due to embolism of left posterior cerebral artery: Secondary | ICD-10-CM | POA: Diagnosis not present

## 2016-01-23 DIAGNOSIS — Z7982 Long term (current) use of aspirin: Secondary | ICD-10-CM | POA: Diagnosis not present

## 2016-01-23 DIAGNOSIS — I252 Old myocardial infarction: Secondary | ICD-10-CM | POA: Diagnosis not present

## 2016-01-23 DIAGNOSIS — I6312 Cerebral infarction due to embolism of basilar artery: Secondary | ICD-10-CM

## 2016-01-23 DIAGNOSIS — I110 Hypertensive heart disease with heart failure: Secondary | ICD-10-CM | POA: Diagnosis present

## 2016-01-23 DIAGNOSIS — R5383 Other fatigue: Secondary | ICD-10-CM | POA: Diagnosis present

## 2016-01-23 DIAGNOSIS — I5023 Acute on chronic systolic (congestive) heart failure: Secondary | ICD-10-CM | POA: Diagnosis present

## 2016-01-23 DIAGNOSIS — I5022 Chronic systolic (congestive) heart failure: Secondary | ICD-10-CM | POA: Diagnosis not present

## 2016-01-23 DIAGNOSIS — I493 Ventricular premature depolarization: Secondary | ICD-10-CM | POA: Diagnosis present

## 2016-01-23 DIAGNOSIS — I2109 ST elevation (STEMI) myocardial infarction involving other coronary artery of anterior wall: Secondary | ICD-10-CM | POA: Diagnosis present

## 2016-01-23 DIAGNOSIS — I080 Rheumatic disorders of both mitral and aortic valves: Secondary | ICD-10-CM | POA: Diagnosis present

## 2016-01-23 DIAGNOSIS — Z7901 Long term (current) use of anticoagulants: Secondary | ICD-10-CM | POA: Diagnosis not present

## 2016-01-23 LAB — COMPREHENSIVE METABOLIC PANEL
ALBUMIN: 3.4 g/dL — AB (ref 3.5–5.0)
ALK PHOS: 59 U/L (ref 38–126)
ALT: 40 U/L (ref 17–63)
ANION GAP: 10 (ref 5–15)
AST: 57 U/L — ABNORMAL HIGH (ref 15–41)
BILIRUBIN TOTAL: 1.2 mg/dL (ref 0.3–1.2)
BUN: 19 mg/dL (ref 6–20)
CALCIUM: 8.9 mg/dL (ref 8.9–10.3)
CO2: 24 mmol/L (ref 22–32)
Chloride: 102 mmol/L (ref 101–111)
Creatinine, Ser: 1.07 mg/dL (ref 0.61–1.24)
GLUCOSE: 118 mg/dL — AB (ref 65–99)
Potassium: 4 mmol/L (ref 3.5–5.1)
Sodium: 136 mmol/L (ref 135–145)
Total Protein: 6.2 g/dL — ABNORMAL LOW (ref 6.5–8.1)

## 2016-01-23 LAB — TROPONIN I
TROPONIN I: 8.65 ng/mL — AB (ref <0.03)
Troponin I: 8.88 ng/mL (ref <0.03)
Troponin I: 7.27 ng/mL (ref <0.03)

## 2016-01-23 LAB — BRAIN NATRIURETIC PEPTIDE: B NATRIURETIC PEPTIDE 5: 179.6 pg/mL — AB (ref 0.0–100.0)

## 2016-01-23 LAB — PHOSPHORUS: Phosphorus: 3.4 mg/dL (ref 2.5–4.6)

## 2016-01-23 LAB — MAGNESIUM: Magnesium: 2.2 mg/dL (ref 1.7–2.4)

## 2016-01-23 MED ORDER — CARVEDILOL 12.5 MG PO TABS
12.5000 mg | ORAL_TABLET | Freq: Two times a day (BID) | ORAL | Status: DC
  Administered 2016-01-23 – 2016-01-24 (×3): 12.5 mg via ORAL
  Filled 2016-01-23 (×3): qty 1

## 2016-01-23 MED ORDER — GADOBENATE DIMEGLUMINE 529 MG/ML IV SOLN
20.0000 mL | Freq: Once | INTRAVENOUS | Status: AC | PRN
  Administered 2016-01-23: 20 mL via INTRAVENOUS

## 2016-01-23 NOTE — Progress Notes (Signed)
VASCULAR LAB PRELIMINARY  PRELIMINARY  PRELIMINARY  PRELIMINARY  Carotid duplex completed.    Preliminary report:  1-39% ICA plaquing.  Vertebral artery flow is antegrade.   Wissam Resor, RVT 01/23/2016, 5:35 PM

## 2016-01-23 NOTE — Progress Notes (Signed)
SUBJECTIVE:  Feels OK.  No chest pain.  No SOB.    PHYSICAL EXAM Vitals:   01/22/16 2300 01/22/16 2330 01/22/16 2355 01/23/16 0500  BP: 96/64 95/76 118/86 98/74  Pulse: 77 76 81 74  Resp: 23 16 20 20   Temp:   98.3 F (36.8 C) 98.6 F (37 C)  TempSrc:   Oral Oral  SpO2: 93% 94% 98% 98%  Weight:   217 lb (98.4 kg)   Height:   5\' 10"  (1.778 m)    General:  No distress Lungs:  Clear Heart:  RRR Abdomen:  Positive bowel sounds, no rebound no guarding Extremities:  No edema  Neuro:  Nonfocal   LABS: Lab Results  Component Value Date   TROPONINI 8.65 (HH) 01/23/2016   Results for orders placed or performed during the hospital encounter of 01/22/16 (from the past 24 hour(s))  Basic metabolic panel     Status: Abnormal   Collection Time: 01/22/16  8:30 PM  Result Value Ref Range   Sodium 135 135 - 145 mmol/L   Potassium 4.1 3.5 - 5.1 mmol/L   Chloride 105 101 - 111 mmol/L   CO2 23 22 - 32 mmol/L   Glucose, Bld 135 (H) 65 - 99 mg/dL   BUN 18 6 - 20 mg/dL   Creatinine, Ser 1.04 0.61 - 1.24 mg/dL   Calcium 9.0 8.9 - 10.3 mg/dL   GFR calc non Af Amer >60 >60 mL/min   GFR calc Af Amer >60 >60 mL/min   Anion gap 7 5 - 15  CBC     Status: Abnormal   Collection Time: 01/22/16  8:30 PM  Result Value Ref Range   WBC 15.5 (H) 4.0 - 10.5 K/uL   RBC 5.13 4.22 - 5.81 MIL/uL   Hemoglobin 17.2 (H) 13.0 - 17.0 g/dL   HCT 48.1 39.0 - 52.0 %   MCV 93.8 78.0 - 100.0 fL   MCH 33.5 26.0 - 34.0 pg   MCHC 35.8 30.0 - 36.0 g/dL   RDW 13.1 11.5 - 15.5 %   Platelets 239 150 - 400 K/uL  I-stat troponin, ED     Status: Abnormal   Collection Time: 01/22/16  8:43 PM  Result Value Ref Range   Troponin i, poc 8.07 (HH) 0.00 - 0.08 ng/mL   Comment NOTIFIED PHYSICIAN    Comment 3          Brain natriuretic peptide     Status: Abnormal   Collection Time: 01/23/16 12:33 AM  Result Value Ref Range   B Natriuretic Peptide 179.6 (H) 0.0 - 100.0 pg/mL  Comprehensive metabolic panel     Status:  Abnormal   Collection Time: 01/23/16 12:49 AM  Result Value Ref Range   Sodium 136 135 - 145 mmol/L   Potassium 4.0 3.5 - 5.1 mmol/L   Chloride 102 101 - 111 mmol/L   CO2 24 22 - 32 mmol/L   Glucose, Bld 118 (H) 65 - 99 mg/dL   BUN 19 6 - 20 mg/dL   Creatinine, Ser 1.07 0.61 - 1.24 mg/dL   Calcium 8.9 8.9 - 10.3 mg/dL   Total Protein 6.2 (L) 6.5 - 8.1 g/dL   Albumin 3.4 (L) 3.5 - 5.0 g/dL   AST 57 (H) 15 - 41 U/L   ALT 40 17 - 63 U/L   Alkaline Phosphatase 59 38 - 126 U/L   Total Bilirubin 1.2 0.3 - 1.2 mg/dL   GFR calc non Af Amer >  60 >60 mL/min   GFR calc Af Amer >60 >60 mL/min   Anion gap 10 5 - 15  Magnesium     Status: None   Collection Time: 01/23/16 12:49 AM  Result Value Ref Range   Magnesium 2.2 1.7 - 2.4 mg/dL  Phosphorus     Status: None   Collection Time: 01/23/16 12:49 AM  Result Value Ref Range   Phosphorus 3.4 2.5 - 4.6 mg/dL  Troponin I     Status: Abnormal   Collection Time: 01/23/16 12:49 AM  Result Value Ref Range   Troponin I 8.88 (HH) <0.03 ng/mL  Troponin I     Status: Abnormal   Collection Time: 01/23/16  5:54 AM  Result Value Ref Range   Troponin I 8.65 (HH) <0.03 ng/mL    Intake/Output Summary (Last 24 hours) at 01/23/16 0920 Last data filed at 01/23/16 0500  Gross per 24 hour  Intake                0 ml  Output              200 ml  Net             -200 ml   MRI:  2 small areas of acute infarct left occipital lobe, possibly emboli. Moderate chronic ischemic changes. Moderate chronic micro hemorrhage in multiple areas of the brain, most likely due to poorly controlled Hypertension.  CT:  No acute intracranial abnormalities. Patchy low-attenuation changes in the deep white matter probably relate to small vessel ischemia.   TELE:  Vent bigeminy.  No sustained arrhythmias.    ASSESSMENT AND PLAN:  DIZZINESS:   MRI as above.  Neurology has been consulted.  CAD:  Continue current therapy.  Trop is up but I will continue to trend.  I  don't suspect another acute coronary ischemic event.   No acute changes on EKG.   BP is running low.   I am going to reduce the beta blocker dose.   ISCHEMIC CARDIOMYOPATHY:    Tele with ventricular ectopy.  I do not strongly suspect that this was an arrhythmic event.  However, I am going to order a [place] 01/23/2016 9:20 AM

## 2016-01-23 NOTE — Consult Note (Signed)
Initial Neurological Consultation                      NEURO HOSPITALIST CONSULT NOTE   Requestig physician: Dr. Eula Fried   Reason for Consult:  2 small occipital infarctions.   HPI:                                                                                                                                          Dwayne [name] is an 60 y.o. male who was recently diagnosed with a STEMI and was discharged after appropriate evaluation and treatment. Later yesterday evening he was at a restaurant with his wife and some friends reports they were sitting in a semicircular booth when he began to feel diaphoretic with shortness of breath and generalized fatigue. He took a sublingual nitroglycerin tablet and an aspirin. His wife called 17 and he came back to the hospital for these symptoms. He has a known low ejection fraction of 25% and was experiencing bigeminy while in the hospital. He was complaining of some heaviness of his left arm.  MRI of the brain has been obtained. It reveals 2 small left occipital infarctions. There is no evidence of any ischemia in the middle cerebral artery distribution on the right. He reports the symptoms have subsequently resolved.    Past Medical History:  Diagnosis Date  . Adenomatous colon polyp 2000  . Diverticulosis   . Erectile dysfunction   . Hypercholesteremia   . Hypertension   . Internal hemorrhoids   . Kidney stones   . Splenomegaly     Past Surgical History:  Procedure Laterality Date  . CARDIAC CATHETERIZATION N/A 01/20/2016   Procedure: Left Heart Cath and Coronary Angiography;  Surgeon: Burnell Blanks, MD; LAD 100%, D1 100%, multi-vessel dz 10-30% in other vessels, EF 25-35%, elevated LVEDP  . CARDIAC CATHETERIZATION N/A 01/20/2016   Procedure: Coronary Stent Intervention;  Surgeon: Burnell Blanks, MD; PROMUS PREM MR 3.5X16 mm DES LAD, D1 jailed by the stent but was already occluded  . CHOLECYSTECTOMY  2009  . EYE  SURGERY     Age 60  . [place] SURGERY  1993  . REFRACTIVE SURGERY  2008  . SHOULDER SURGERY  1998   Left shoulder fracture-clavicle  . WRIST FRACTURE SURGERY  1995   Bilateral    MEDICATIONS:  I have reviewed the patient's current medications.  Allergies  Allergen Reactions  . Codeine Nausea Only  . Lipitor [Atorvastatin] Other (See Comments)    Myalgia     Social History:  reports that he has never smoked. He has never used smokeless tobacco. He reports that he drinks alcohol. He reports that he does not use drugs.  Family History  Problem Relation Age of Onset  . Heart disease Father   . CVA Father   . CVA Brother      ROS:                                                                                                                                       History obtained from chart review  General ROS: negative for - chills, fatigue, fever, night sweats, weight gain or weight loss Psychological ROS: negative for - behavioral disorder, hallucinations, memory difficulties, mood swings or suicidal ideation Ophthalmic ROS: negative for - blurry vision, double vision, eye pain or loss of vision ENT ROS: negative for - epistaxis, nasal discharge, oral lesions, sore throat, tinnitus or vertigo Allergy and Immunology ROS: negative for - hives or itchy/watery eyes Hematological and Lymphatic ROS: negative for - bleeding problems, bruising or swollen lymph nodes Endocrine ROS: negative for - galactorrhea, hair pattern changes, polydipsia/polyuria or temperature intolerance Respiratory ROS: negative for - cough, hemoptysis, shortness of breath or wheezing Cardiovascular ROS: negative for - chest pain, dyspnea on exertion, edema or irregular heartbeat Gastrointestinal ROS: negative for - abdominal pain, diarrhea, hematemesis, nausea/vomiting or stool  incontinence Genito-Urinary ROS: negative for - dysuria, hematuria, incontinence or urinary frequency/urgency Musculoskeletal ROS: negative for - joint swelling or muscular weakness Neurological ROS: as noted in HPI Dermatological ROS: negative for rash and skin lesion changes   General Exam                                                                                                      Blood pressure 98/74, pulse 74, temperature 98.6 F (37 C), temperature source Oral, resp. rate 20, height 5\' 10"  (1.778 m), weight 98.4 kg (217 lb), SpO2 98 %. HEENT-  Normocephalic, no lesions, without obvious abnormality.  Normal external eye and conjunctiva.  Normal TM's bilaterally.  Normal auditory canals and external ears. Normal external nose, mucus membranes and septum.  Normal pharynx. Cardiovascular- regular rate and rhythm, S1, S2 normal, no murmur, click, rub or gallop, pulses palpable throughout   Lungs- chest clear, no wheezing, rales, normal  symmetric air entry, Heart exam - S1, S2 normal, no murmur, no gallop, rate regular Abdomen- soft, non-tender; bowel sounds normal; no masses,  no organomegaly Extremities- less then 2 second capillary refill   Neurological Examination Mental Status: Alert, oriented, thought content appropriate.  Speech fluent without evidence of aphasia.  Able to follow 3 step commands without difficulty. Cranial Nerves: II: Discs flat bilaterally; Visual fields grossly normal, pupils equal, round, reactive to light and accommodation III,IV, VI: ptosis not present, extra-ocular motions intact bilaterally V,VII: smile symmetric, facial light touch sensation normal bilaterally VIII: hearing normal bilaterally IX,X: uvula rises symmetrically XI: bilateral shoulder shrug XII: midline tongue extension Motor: Right : Upper extremity   5/5    Left:     Upper extremity   5/5  Lower extremity   5/5     Lower extremity   5/5 Tone and bulk:normal tone throughout; no  atrophy noted Sensory: Pinprick and light touch intact throughout, bilaterally Deep Tendon Reflexes: 2+ and symmetric throughout Plantars: Right: downgoing   Left: downgoing Cerebellar: normal finger-to-nose, normal rapid alternating movements and normal heel-to-shin test Gait: normal gait and station      Lab Results: Basic Metabolic Panel:  Recent Labs Lab 01/20/16 1615 01/21/16 0658 01/22/16 0942 01/22/16 2030 01/23/16 0049  NA 141  138 135  --  135 136  K 3.8  3.7 3.6  --  4.1 4.0  CL 96*  99* 100*  --  105 102  CO2 30 26  --  23 24  GLUCOSE 117*  120* 96  --  135* 118*  BUN 19  15 14   --  18 19  CREATININE 1.10  1.22 0.99  --  1.04 1.07  CALCIUM 9.5 8.6*  --  9.0 8.9  MG  --   --  2.0  --  2.2  PHOS  --   --   --   --  3.4    Liver Function Tests:  Recent Labs Lab 01/20/16 1615 01/23/16 0049  AST 39 57*  ALT 48 40  ALKPHOS 70 59  BILITOT 1.1 1.2  PROT 7.1 6.2*  ALBUMIN 4.2 3.4*   No results for input(s): LIPASE, AMYLASE in the last 168 hours. No results for input(s): AMMONIA in the last 168 hours.  CBC:  Recent Labs Lab 01/20/16 1615 01/21/16 0658 01/22/16 2030  WBC 19.9* 18.0* 15.5*  HGB 18.0*  18.6* 17.7* 17.2*  HCT 53.0*  51.3 49.7 48.1  MCV 94.8 93.6 93.8  PLT 301 232 239    Cardiac Enzymes:  Recent Labs Lab 01/20/16 1615 01/20/16 1822 01/20/16 2255 01/21/16 0658 01/23/16 0049 01/23/16 0554  CKTOTAL 54  --   --   --   --   --   CKMB 2.6  --   --   --   --   --   TROPONINI  --  2.27* 19.41* 23.15* 8.88* 8.65*    Lipid Panel:  Recent Labs Lab 01/20/16 1615  CHOL 216*  TRIG 175*  HDL 42  CHOLHDL 5.1  VLDL 35  LDLCALC 139*    CBG: No results for input(s): GLUCAP in the last 168 hours.  Microbiology: Results for orders placed or performed during the hospital encounter of 01/20/16  MRSA PCR Screening     Status: None   Collection Time: 01/20/16  6:24 PM  Result Value Ref Range Status   MRSA by PCR NEGATIVE  NEGATIVE Final    Comment:  The GeneXpert MRSA Assay (FDA approved for NASAL specimens only), is one component of a comprehensive MRSA colonization surveillance program. It is not intended to diagnose MRSA infection nor to guide or monitor treatment for MRSA infections.     Coagulation Studies:  Recent Labs  01/20/16 1615 01/21/16 0800  LABPROT 12.8 14.0  INR 0.96 1.07    Imaging: Ct Head Wo Contrast  Result Date: 01/23/2016 CLINICAL DATA:  Weakness in near syncopal episode this evening. History of LAD stent placed on Wednesday and released from hospital today. EXAM: CT HEAD WITHOUT CONTRAST TECHNIQUE: Contiguous axial images were obtained from the base of the skull through the vertex without intravenous contrast. COMPARISON:  None. FINDINGS: Brain: Ventricles and sulci appear symmetrical. No ventricular dilatation. Patchy low-attenuation changes are present in the deep white matter probably representing small vessel ischemic change. No mass effect or midline shift. No abnormal extra-axial fluid collections. Gray-white matter junctions are distinct. Basal cisterns are not effaced. No evidence of acute intracranial hemorrhage. Vascular: No hyperdense vessel or unexpected calcification. Skull: No acute depressed skull fractures. Sinuses/Orbits: No acute finding. Other: None. IMPRESSION: No acute intracranial abnormalities. Patchy low-attenuation changes in the deep white matter probably relate to small vessel ischemia. Electronically Signed   By: Lucienne Capers M.D.   On: 01/23/2016 01:14   Mr Jeri Cos [id number] Contrast  Result Date: 01/23/2016 CLINICAL DATA:  TIA. Cardiac catheterization and stent intervention 01/20/2016 EXAM: MRI HEAD WITHOUT AND WITH CONTRAST TECHNIQUE: Multiplanar, multiecho pulse sequences of the brain and surrounding structures were obtained without and with intravenous contrast. CONTRAST:  11mL MULTIHANCE GADOBENATE DIMEGLUMINE 529 MG/ML IV SOLN COMPARISON:  CT  head 01/23/2016 FINDINGS: 5 mm acute infarct left occipital lobe. 3 mm acute infarct left occipital pole. No other areas of acute infarct. Moderate chronic microvascular ischemic changes in the white matter and pons. Ventricle size normal.  Cerebral volume normal. Scattered areas of micro hemorrhage including the right occipital parietal lobe, right posterior temporal lobe, bilateral cerebellum, and right temporal lobe. Negative for mass or edema.  No shift of the midline structures Postcontrast imaging demonstrates normal enhancement. No enhancing mass lesion. Normal dural sinus enhancement. Paranasal sinuses clear. Orbital structures normal. Pituitary normal in size. IMPRESSION: 2 small areas of acute infarct left occipital lobe, possibly emboli. Moderate chronic ischemic changes. Moderate chronic micro hemorrhage in multiple areas of the brain, most likely due to poorly controlled hypertension. Electronically Signed   By: Franchot Gallo M.D.   On: 01/23/2016 07:39       Assessment/Plan:  Dwayne [name] is a pleasant 60 year old gentleman who experienced a general sense of fatigue, anxiety, diaphoresis, and shortness of breath yesterday while at a restaurant. He was also noting a sensation of heaviness of the left upper extremity. His MRI reveals the presence of 2 small incidental left occipital infarctions. These are not likely to be contributing to the symptoms he was having yesterday. We will initiate a few additional investigations for risk factor reduction for further ischemic change. The small lesions appear to be clinically asymptomatic. There is no evidence of any deficit in the visual field testing.  Plan:  1. MRA  of the brain without contrast 2. Telemetry monitoring 3. please page stroke NP  Or  PA  Or MD from 8am -4 pm  as this patient from this time will be  followed by the stroke.   You can look them up on [email or web address]  Password [email or web address]    James A. Tasia Catchings, M.D. Neurohospitalist Phone:  [phone]  01/23/2016, 10:06 AM

## 2016-01-24 ENCOUNTER — Encounter (HOSPITAL_COMMUNITY): Payer: Self-pay | Admitting: *Deleted

## 2016-01-24 ENCOUNTER — Inpatient Hospital Stay (HOSPITAL_COMMUNITY): Payer: Commercial Managed Care - HMO

## 2016-01-24 DIAGNOSIS — E785 Hyperlipidemia, unspecified: Secondary | ICD-10-CM

## 2016-01-24 DIAGNOSIS — I255 Ischemic cardiomyopathy: Secondary | ICD-10-CM

## 2016-01-24 DIAGNOSIS — I63432 Cerebral infarction due to embolism of left posterior cerebral artery: Secondary | ICD-10-CM

## 2016-01-24 LAB — VAS US CAROTID
LCCADDIAS: 26 cm/s
LCCAPDIAS: 24 cm/s
LEFT ECA DIAS: -43 cm/s
LEFT VERTEBRAL DIAS: -17 cm/s
LICADDIAS: -18 cm/s
LICAPDIAS: -27 cm/s
LICAPSYS: -74 cm/s
Left CCA dist sys: 78 cm/s
Left CCA prox sys: 120 cm/s
Left ICA dist sys: -50 cm/s
RCCAPSYS: -96 cm/s
RIGHT ECA DIAS: -14 cm/s
RIGHT VERTEBRAL DIAS: -6 cm/s
Right CCA prox dias: -13 cm/s

## 2016-01-24 MED ORDER — LISINOPRIL 5 MG PO TABS: 5.0000 mg | ORAL_TABLET | Freq: Every day | ORAL | 11 refills | Status: DC

## 2016-01-24 MED ORDER — CARVEDILOL 12.5 MG PO TABS
12.5000 mg | ORAL_TABLET | Freq: Two times a day (BID) | ORAL | 11 refills | Status: DC
Start: 2016-01-24 — End: 2017-01-07

## 2016-01-24 MED ORDER — LISINOPRIL 5 MG PO TABS
5.0000 mg | ORAL_TABLET | Freq: Every day | ORAL | Status: DC
  Administered 2016-01-24: 5 mg via ORAL
  Filled 2016-01-24: qty 1

## 2016-01-24 MED ORDER — IOPAMIDOL (ISOVUE-370) INJECTION 76%
INTRAVENOUS | Status: AC
  Administered 2016-01-24: 50 mL
  Filled 2016-01-24: qty 50

## 2016-01-24 MED ORDER — LISINOPRIL 10 MG PO TABS: 10.0000 mg | ORAL_TABLET | Freq: Every day | ORAL | Status: DC

## 2016-01-24 NOTE — Progress Notes (Signed)
STROKE TEAM PROGRESS NOTE   HISTORY OF PRESENT ILLNESS (per record) Dwayne [name] is an 60 y.o. male who was recently diagnosed with a STEMI and was discharged after appropriate evaluation and treatment. Later yesterday evening he was at a restaurant with his wife and some friends reports they were sitting in a semicircular booth when he began to feel diaphoretic with shortness of breath and generalized fatigue. He took a sublingual nitroglycerin tablet and an aspirin. His wife called 43 and he came back to the hospital for these symptoms. He has a known low ejection fraction of 25% and was experiencing bigeminy while in the hospital. He was complaining of some heaviness of his left arm.  MRI of the brain has been obtained. It reveals 2 small left occipital infarctions. There is no evidence of any ischemia in the middle cerebral artery distribution on the right. He reports the symptoms have subsequently resolved.   SUBJECTIVE (INTERVAL HISTORY) His wife is at the bedside.  Overall he feels his condition is completely resolved. His BP over night still low at times, decreased lisinopril to 10mg  today. CTA head and neck unremarkable. Plan to d/c today.    OBJECTIVE Temp:  [97.5 F (36.4 C)-98.6 F (37 C)] 97.7 F (36.5 C) (08/27 0526) Pulse Rate:  [68-85] 68 (08/27 0526) Cardiac Rhythm: Normal sinus rhythm (08/27 0703) Resp:  [18] 18 (08/27 0526) BP: (94-119)/(68-78) 103/70 (08/27 0526) SpO2:  [97 %-98 %] 97 % (08/27 0526)  CBC:  Recent Labs Lab 01/21/16 0658 01/22/16 2030  WBC 18.0* 15.5*  HGB 17.7* 17.2*  HCT 49.7 48.1  MCV 93.6 93.8  PLT 232 [id number]    Basic Metabolic Panel:  Recent Labs Lab 01/22/16 0942 01/22/16 2030 01/23/16 0049  NA  --  135 136  K  --  4.1 4.0  CL  --  105 102  CO2  --  23 24  GLUCOSE  --  135* 118*  BUN  --  18 19  CREATININE  --  1.04 1.07  CALCIUM  --  9.0 8.9  MG 2.0  --  2.2  PHOS  --   --  3.4    Lipid Panel:    Component Value  Date/Time   CHOL 216 (H) 01/20/2016 1615   TRIG 175 (H) 01/20/2016 1615   HDL 42 01/20/2016 1615   CHOLHDL 5.1 01/20/2016 1615   VLDL 35 01/20/2016 1615   LDLCALC 139 (H) 01/20/2016 1615   HgbA1c:  Lab Results  Component Value Date   HGBA1C 5.1 01/20/2016   Urine Drug Screen: No results found for: LABOPIA, COCAINSCRNUR, LABBENZ, AMPHETMU, THCU, LABBARB    IMAGING I have personally reviewed the radiological images below and agree with the radiology interpretations.  Ct Head Wo Contrast 01/23/2016 No acute intracranial abnormalities. Patchy low-attenuation changes in the deep white matter probably relate to small vessel ischemia.   Mr Dwayne [name] Wo Contrast  01/23/2016 2 small areas of acute infarct left occipital lobe, possibly emboli. Moderate chronic ischemic changes. Moderate chronic micro hemorrhage in multiple areas of the brain, most likely due to poorly controlled hypertension.  CTA head and neck - pending official reports, but by my read, there are mild athro at b/l ICA bifurcations but no significant stenosis.    PHYSICAL EXAM  Temp:  [97.5 F (36.4 C)-98.6 F (37 C)] 97.7 F (36.5 C) (08/27 0526) Pulse Rate:  [68-85] 68 (08/27 0526) Resp:  [18] 18 (08/27 0526) BP: (94-119)/(68-78) 103/70 (08/27 0526) SpO2:  [  97 %-98 %] 97 % (08/27 0526)  General - Well nourished, well developed, in no apparent distress.  Ophthalmologic - Sharp disc margins OU.   Cardiovascular - Regular rate and rhythm.  Mental Status -  Level of arousal and orientation to time, place, and person were intact. Language including expression, naming, repetition, comprehension was assessed and found intact. Attention span and concentration were normal. Recent and remote memory were intact. Fund of Knowledge was assessed and was intact.  Cranial Nerves II - XII - II - Visual field intact OU. III, IV, VI - Extraocular movements intact. V - Facial sensation intact bilaterally. VII - Facial  movement intact bilaterally. VIII - Hearing & vestibular intact bilaterally. X - Palate elevates symmetrically. XI - Chin turning & shoulder shrug intact bilaterally. XII - Tongue protrusion intact.  Motor Strength - The patient's strength was normal in all extremities and pronator drift was absent.  Bulk was normal and fasciculations were absent.   Motor Tone - Muscle tone was assessed at the neck and appendages and was normal.  Reflexes - The patient's reflexes were 1+ in all extremities and he had no pathological reflexes.  Sensory - Light touch, temperature/pinprick were assessed and were symmetrical.    Coordination - The patient had normal movements in the hands and feet with no ataxia or dysmetria.  Tremor was absent.  Gait and Station - deferred.   ASSESSMENT/PLAN [name] is a 60 y.o. male with history of hypertension, hyperlipidemia, coronary artery disease with recent MI, left ventricular dysfunction with EF 25% to 35% by heart catheterization, and recent ventricular bigeminy presenting with fatigue, diaphoresis, dyspnea, and left arm heaviness. He did not receive IV t-PA due to minimal neurologic deficits.  Strokes:  left PCA and MCA/PCA punctate infarcts - likely due to cardiac cath vs. Recent STEMI - pt symptoms are consistent with presyncope and not related to the MRI findings  Resultant  Presyncope symptoms resolved.  MRI - 2 small areas of acute punctate infarct left occipital lobe.   CTA head and neck - unremarkable  Carotid Doppler 1-39% ICA plaquing.  Vertebral artery flow is antegrade.   2D Echo - 01/21/2016 - EF 30-35%. No cardiac source of emboli identified.  LDL - 139  HgbA1c - 5.1  VTE prophylaxis - subcutaneous heparin  Diet Heart Room service appropriate? Yes; Fluid consistency: Thin  aspirin 81 mg daily and Brilinta 90 mg Bid prior to admission, now on aspirin 81 mg daily and Brilinta 90 mg Bid. Continue ASA and brilinta on  discharge.  Patient counseled to be compliant with his antithrombotic medications  Ongoing aggressive stroke risk factor management  Therapy recommendations: Pending  Disposition: Pending, likely home today  Hypertension hx but now hypotension  mildly low  Decreased coreg dose yesterday  Decreased lisinopril from 20 to 10 today  Pt encouraged to check BP at home  Cardiology on board  Long-term BP goal normotensive  Hyperlipidemia  Home meds:  Crestor 40 mg daily started recently and resumed in hospital  LDL 139, goal < 70  Continue statin at discharge  Other Stroke Risk Factors  Advanced age  ETOH use - approximately 2 drinks per week.  Obesity, Body mass index is 31.14 kg/m., recommend weight loss, diet and exercise as appropriate   Family hx stroke (father and brother)  Coronary artery disease - recent STEMI s/p stent  Cardiomyopathy with EF 30-35% - no need anticoagulation at this time  Other Active Problems  Elevated  troponin enzymes - probably secondary to recent MI  Leukocytosis - 15.5 - afebrile  Left ventricular dysfunction  Ventricular bigeminy  Hospital day # 1  Neurology will sign off. Please call with questions. Pt will follow up with Cecille Rubin NP at [facility] in about 2 months. Thanks for the consult.  Rosalin Hawking, MD PhD Stroke Neurology 01/24/2016 11:04 AM     To contact Stroke Continuity provider, please refer to [email or web address] After hours, contact General Neurology

## 2016-01-24 NOTE — Discharge Summary (Signed)
Discharge Summary    Patient ID: Dwayne [name],  MRN: [id number], DOB/AGE: [birth year] 60 y.o.  Admit date: 01/22/2016 Discharge date: 01/24/2016  Primary Care Provider: Gaynelle Arabian R Primary Cardiologist: Nahser  Discharge Diagnoses    Active Problems:   Hypertension   Hypercholesteremia   Chronic systolic CHF (congestive heart failure), NYHA class 1 (HCC)   Dizziness   Syncope   Allergies Allergies  Allergen Reactions  . Codeine Nausea Only  . Lipitor [Atorvastatin] Other (See Comments)    Myalgia    Diagnostic Studies/Procedures    US Carotid, 01/23/2016 Summary: Bilateral: mild mixed plaque origin ICA. Mild soft plaque proximal ICA and ECA. Vertebral artery flow is antegrade. 1-39% ICA plaquing. Left ECA velocities are elevated.  Other specific details can be found in the table(s) above. Prepared and Electronically Authenticated by  Leia Alf, MD 2017-08-27T16:55:42 _____________   History of Present Illness      60 y.o. WM with recent LAD STEMI  Successfully revascularized admitted on 8/23 and d/c'ed today developed presyncope and dizziness at home. He returned back and is being admitted for observation.   Hospital Course     Consultants: Neurology   Dwayne [name] was discharged home on 08/25. After discharge, he began feeling diaphoretic and was short of breath. He was also tired. He took sublingual nitroglycerin and aspirin. He came to the hospital and was initially hypotensive with a systolic blood pressure in the 80s. He also had some heaviness of his left arm. He was admitted and a neurology consult was called.  His medications were adjusted and his blood pressure improved. An MRI of the brain was performed which showed 2 small left occipital infarctions. There was no evidence for ischemia. The symptoms subsequently resolved.  His blood pressure continued to run low on his medications were adjusted downward. Carotid Dopplers post the  rain scans done and showed no critical abnormality and he was cleared for discharge by neurology.  Cardiac enzymes were cycled but they continued to trend down.  On 08/27, he was evaluated by Dr. Percival Spanish and all data were reviewed. Because of his low EF and frequent ventricular ectopy, a LifeVest had been ordered. This was placed on 12/2015 PM. Once a LifeVest was placed, no further inpatient workup was indicated. Dwayne [name] considered stable for discharge, to follow-up as an outpatient. _____________  Discharge Vitals Blood pressure (!) 105/50, pulse 72, temperature 97.5 F (36.4 C), temperature source Oral, resp. rate 18, height 5\' 10"  (1.778 m), weight 217 lb (98.4 kg), SpO2 98 %.  Filed Weights   01/22/16 2028 01/22/16 2355  Weight: 218 lb (98.9 kg) 217 lb (98.4 kg)    Labs & Radiologic Studies    CBC  Recent Labs  01/22/16 2030  WBC 15.5*  HGB 17.2*  HCT 48.1  MCV 93.8  PLT [id number]   Basic Metabolic Panel  Recent Labs  01/22/16 0942 01/22/16 2030 01/23/16 0049  NA  --  135 136  K  --  4.1 4.0  CL  --  105 102  CO2  --  23 24  GLUCOSE  --  135* 118*  BUN  --  18 19  CREATININE  --  1.04 1.07  CALCIUM  --  9.0 8.9  MG 2.0  --  2.2  PHOS  --   --  3.4   Liver Function Tests  Recent Labs  01/23/16 0049  AST 57*  ALT 40  ALKPHOS 59  BILITOT 1.2  PROT 6.2*  ALBUMIN 3.4*    Cardiac Enzymes  Recent Labs  01/23/16 0049 01/23/16 0554 01/23/16 1140  TROPONINI 8.88* 8.65* 7.27*   _____________  Ct Angio Head W Or Wo Contrast  Result Date: 01/24/2016 CLINICAL DATA:  Stroke EXAM: CT ANGIOGRAPHY HEAD AND NECK TECHNIQUE: Multidetector CT imaging of the head and neck was performed using the standard protocol during bolus administration of intravenous contrast. Multiplanar CT image reconstructions and MIPs were obtained to evaluate the vascular anatomy. Carotid stenosis measurements (when applicable) are obtained utilizing NASCET criteria, using the distal  internal carotid diameter as the denominator. CONTRAST:  50 mL Isovue 370 IV COMPARISON:  MRI 01/23/2016 FINDINGS: CTA NECK Aortic arch: Normal aortic arch. Proximal great vessels widely patent. Right carotid system: Right common carotid artery widely patent. Mild atherosclerotic disease involving the right carotid bifurcation without significant stenosis. Left carotid system: Left common carotid artery widely patent. Mild atherosclerotic disease left carotid bifurcation without significant stenosis of the internal carotid artery. There is moderate stenosis at the origin of the left external carotid artery. Vertebral arteries:Left vertebral dominant and widely patent to the basilar without stenosis. Right vertebral artery small and ends in PICA which is a normal variant. Skeleton: Cervical spondylosis. Negative for fracture. No acute skeletal abnormality. Other neck: Negative for adenopathy or mass in the neck. Upper chest: Lung apices clear.  No mediastinal adenopathy. CTA HEAD Anterior circulation: Cavernous carotid widely patent bilaterally with minimal atherosclerotic disease. Anterior and middle cerebral arteries widely patent without stenosis. Posterior circulation: Left vertebral artery dominant and supplies the basilar. Left PICA patent. Basilar widely patent. Right vertebral artery is small and ends in PICA. Superior cerebellar and posterior cerebral arteries patent bilaterally. Fetal origin right posterior cerebral artery. Venous sinuses: Patent Anatomic variants: Negative for vascular malformation Delayed phase: Normal enhancement on delayed scanning. No enhancing mass lesion. IMPRESSION: Mild atherosclerotic disease of the carotid bifurcation without significant internal carotid artery stenosis bilaterally. Moderate stenosis proximal left external carotid artery. No significant vertebral artery stenosis. Right vertebral artery ends in PICA which is a normal variant No significant intracranial stenosis.  Electronically Signed   By: Franchot Gallo M.D.   On: 01/24/2016 11:21   Ct Head Wo Contrast  Result Date: 01/23/2016 CLINICAL DATA:  Weakness in near syncopal episode this evening. History of LAD stent placed on Wednesday and released from hospital today. EXAM: CT HEAD WITHOUT CONTRAST TECHNIQUE: Contiguous axial images were obtained from the base of the skull through the vertex without intravenous contrast. COMPARISON:  None. FINDINGS: Brain: Ventricles and sulci appear symmetrical. No ventricular dilatation. Patchy low-attenuation changes are present in the deep white matter probably representing small vessel ischemic change. No mass effect or midline shift. No abnormal extra-axial fluid collections. Gray-white matter junctions are distinct. Basal cisterns are not effaced. No evidence of acute intracranial hemorrhage. Vascular: No hyperdense vessel or unexpected calcification. Skull: No acute depressed skull fractures. Sinuses/Orbits: No acute finding. Other: None. IMPRESSION: No acute intracranial abnormalities. Patchy low-attenuation changes in the deep white matter probably relate to small vessel ischemia. Electronically Signed   By: Lucienne Capers M.D.   On: 01/23/2016 01:14   Ct Angio Neck W Or Wo Contrast  Result Date: 01/24/2016 CLINICAL DATA:  Stroke EXAM: CT ANGIOGRAPHY HEAD AND NECK TECHNIQUE: Multidetector CT imaging of the head and neck was performed using the standard protocol during bolus administration of intravenous contrast. Multiplanar CT image reconstructions and MIPs were obtained to evaluate the vascular anatomy. Carotid stenosis measurements (when  applicable) are obtained utilizing NASCET criteria, using the distal internal carotid diameter as the denominator. CONTRAST:  50 mL Isovue 370 IV COMPARISON:  MRI 01/23/2016 FINDINGS: CTA NECK Aortic arch: Normal aortic arch. Proximal great vessels widely patent. Right carotid system: Right common carotid artery widely patent. Mild  atherosclerotic disease involving the right carotid bifurcation without significant stenosis. Left carotid system: Left common carotid artery widely patent. Mild atherosclerotic disease left carotid bifurcation without significant stenosis of the internal carotid artery. There is moderate stenosis at the origin of the left external carotid artery. Vertebral arteries:Left vertebral dominant and widely patent to the basilar without stenosis. Right vertebral artery small and ends in PICA which is a normal variant. Skeleton: Cervical spondylosis. Negative for fracture. No acute skeletal abnormality. Other neck: Negative for adenopathy or mass in the neck. Upper chest: Lung apices clear.  No mediastinal adenopathy. CTA HEAD Anterior circulation: Cavernous carotid widely patent bilaterally with minimal atherosclerotic disease. Anterior and middle cerebral arteries widely patent without stenosis. Posterior circulation: Left vertebral artery dominant and supplies the basilar. Left PICA patent. Basilar widely patent. Right vertebral artery is small and ends in PICA. Superior cerebellar and posterior cerebral arteries patent bilaterally. Fetal origin right posterior cerebral artery. Venous sinuses: Patent Anatomic variants: Negative for vascular malformation Delayed phase: Normal enhancement on delayed scanning. No enhancing mass lesion. IMPRESSION: Mild atherosclerotic disease of the carotid bifurcation without significant internal carotid artery stenosis bilaterally. Moderate stenosis proximal left external carotid artery. No significant vertebral artery stenosis. Right vertebral artery ends in PICA which is a normal variant No significant intracranial stenosis. Electronically Signed   By: Franchot Gallo M.D.   On: 01/24/2016 11:21   Mr Jeri [name] [id number] Contrast  Result Date: 01/23/2016 CLINICAL DATA:  TIA. Cardiac catheterization and stent intervention 01/20/2016 EXAM: MRI HEAD WITHOUT AND WITH CONTRAST TECHNIQUE:  Multiplanar, multiecho pulse sequences of the brain and surrounding structures were obtained without and with intravenous contrast. CONTRAST:  74mL MULTIHANCE GADOBENATE DIMEGLUMINE 529 MG/ML IV SOLN COMPARISON:  CT head 01/23/2016 FINDINGS: 5 mm acute infarct left occipital lobe. 3 mm acute infarct left occipital pole. No other areas of acute infarct. Moderate chronic microvascular ischemic changes in the white matter and pons. Ventricle size normal.  Cerebral volume normal. Scattered areas of micro hemorrhage including the right occipital parietal lobe, right posterior temporal lobe, bilateral cerebellum, and right temporal lobe. Negative for mass or edema.  No shift of the midline structures Postcontrast imaging demonstrates normal enhancement. No enhancing mass lesion. Normal dural sinus enhancement. Paranasal sinuses clear. Orbital structures normal. Pituitary normal in size. IMPRESSION: 2 small areas of acute infarct left occipital lobe, possibly emboli. Moderate chronic ischemic changes. Moderate chronic micro hemorrhage in multiple areas of the brain, most likely due to poorly controlled hypertension. Electronically Signed   By: Franchot Gallo M.D.   On: 01/23/2016 07:39    Disposition   Pt is being discharged home today in good condition.  Follow-up Plans & Appointments    Follow-up Information    Dennie Bible, NP. Schedule an appointment as soon as possible for a visit in 2 month(s).   Specialty:  Family Medicine Contact information: [address] [place] [address] [phone]        Ermalinda Barrios, PA-C Follow up on 02/02/2016.   Specialty:  Cardiology Why:  Keep 12:15 appt. Contact information: [address] [place] [address] [phone]          Discharge Instructions  Ambulatory referral to Neurology    Complete by:  As directed   Follow up with NP Cecille Rubin at [facility] in about 2 months. Thanks.   Diet - low sodium heart  healthy    Complete by:  As directed   Increase activity slowly    Complete by:  As directed      Discharge Medications   Current Discharge Medication List    CONTINUE these medications which have CHANGED   Details  carvedilol (COREG) 12.5 MG tablet Take 1 tablet (12.5 mg total) by mouth 2 (two) times daily with a meal. Qty: 60 tablet, Refills: 11    lisinopril (PRINIVIL,ZESTRIL) 5 MG tablet Take 1 tablet (5 mg total) by mouth daily. Qty: 30 tablet, Refills: 11      CONTINUE these medications which have NOT CHANGED   Details  aspirin 325 MG tablet Take 325 mg by mouth once.    aspirin 81 MG chewable tablet Chew 1 tablet (81 mg total) by mouth daily.    Multiple Vitamin (MULTIVITAMIN WITH MINERALS) TABS tablet Take 1 tablet by mouth daily.    nitroGLYCERIN (NITROSTAT) 0.4 MG SL tablet Place 1 tablet (0.4 mg total) under the tongue every 5 (five) minutes as needed for chest pain. Qty: 25 tablet, Refills: 3    Omega-3 Fatty Acids (FISH OIL) 1000 MG CAPS Take 1,000 mg by mouth daily.    !! OVER THE COUNTER MEDICATION Take 1 capsule by mouth daily. Bile Acid factor    !! OVER THE COUNTER MEDICATION Apply 1 application topically daily as needed (neck pain). CBD Oil    rosuvastatin (CRESTOR) 40 MG tablet Take 1 tablet (40 mg total) by mouth daily at 6 PM. Qty: 30 tablet, Refills: 11    ticagrelor (BRILINTA) 90 MG TABS tablet Take 1 tablet (90 mg total) by mouth 2 (two) times daily. Qty: 60 tablet, Refills: 11     !! - Potential duplicate medications found. Please discuss with provider.        Outstanding Labs/Studies   None  Duration of Discharge Encounter   Greater than 30 minutes including physician time.  Jonetta Speak NP 01/24/2016, 6:11 PM

## 2016-01-24 NOTE — Progress Notes (Signed)
    SUBJECTIVE:  Feels OK.  He did feel weak last night and he had the nurses take his BP which was 98 systolic.  No acute dizziness this AM.  No chest pain.    PHYSICAL EXAM Vitals:   01/23/16 2008 01/23/16 2256 01/24/16 0024 01/24/16 0526  BP: 116/78 94/68 113/75 103/70  Pulse: 85  72 68  Resp: 18   18  Temp: 98.6 F (37 C)   97.7 F (36.5 C)  TempSrc: Oral   Oral  SpO2: 98%   97%  Weight:      Height:       General:  No distress Lungs:  Clear Heart:  RRR Abdomen:  Positive bowel sounds, no rebound no guarding Extremities:  No edema  Neuro:  Nonfocal   LABS: Lab Results  Component Value Date   TROPONINI 7.27 (HH) 01/23/2016   Results for orders placed or performed during the hospital encounter of 01/22/16 (from the past 24 hour(s))  Troponin I     Status: Abnormal   Collection Time: 01/23/16 11:40 AM  Result Value Ref Range   Troponin I 7.27 (HH) <0.03 ng/mL    Intake/Output Summary (Last 24 hours) at 01/24/16 0748 Last data filed at 01/23/16 1723  Gross per 24 hour  Intake              720 ml  Output                0 ml  Net              720 ml   MRI:  2 small areas of acute infarct left occipital lobe, possibly emboli. Moderate chronic ischemic changes. Moderate chronic micro hemorrhage in multiple areas of the brain, most likely due to poorly controlled Hypertension.  CT:  No acute intracranial abnormalities. Patchy low-attenuation changes in the deep white matter probably relate to small vessel ischemia.   TELE:  Vent bigeminy.  No sustained arrhythmias.    ASSESSMENT AND PLAN:  DIZZINESS:   MRI as above.  Neurology suggested that the MRI findings were incidental and not likely related to the symptoms.  CT has been ordered this morning.  We will follow up any neuro suggestions and likely discharge later today.   CAD:  Continue current therapy.  Trop is up but the trend is down and there is no evidence for an acute coronary syndrome.  I did reduce  the beta blocker secondary to low BPs and dizziness.  I will reduce the ACE inhibitor.   ISCHEMIC CARDIOMYOPATHY:    Tele with ventricular ectopy.  I do not strongly suspect that this was an arrhythmic event.   I plan a [place] 01/24/2016 7:48 AM

## 2016-01-29 ENCOUNTER — Encounter: Payer: Self-pay | Admitting: Physician Assistant

## 2016-02-02 ENCOUNTER — Encounter: Payer: Self-pay | Admitting: Physician Assistant

## 2016-02-02 ENCOUNTER — Ambulatory Visit (INDEPENDENT_AMBULATORY_CARE_PROVIDER_SITE_OTHER): Payer: Commercial Managed Care - HMO | Admitting: Physician Assistant

## 2016-02-02 ENCOUNTER — Ambulatory Visit (INDEPENDENT_AMBULATORY_CARE_PROVIDER_SITE_OTHER): Payer: Commercial Managed Care - HMO | Admitting: Pharmacist

## 2016-02-02 VITALS — BP 102/76 | HR 68 | Ht 70.0 in | Wt 218.0 lb

## 2016-02-02 DIAGNOSIS — Z7902 Long term (current) use of antithrombotics/antiplatelets: Secondary | ICD-10-CM | POA: Insufficient documentation

## 2016-02-02 DIAGNOSIS — I5022 Chronic systolic (congestive) heart failure: Secondary | ICD-10-CM | POA: Diagnosis not present

## 2016-02-02 DIAGNOSIS — I2102 ST elevation (STEMI) myocardial infarction involving left anterior descending coronary artery: Secondary | ICD-10-CM

## 2016-02-02 DIAGNOSIS — I2510 Atherosclerotic heart disease of native coronary artery without angina pectoris: Secondary | ICD-10-CM

## 2016-02-02 DIAGNOSIS — R55 Syncope and collapse: Secondary | ICD-10-CM

## 2016-02-02 DIAGNOSIS — Z79899 Other long term (current) drug therapy: Secondary | ICD-10-CM

## 2016-02-02 NOTE — Progress Notes (Signed)
Cardiology Office Note    Date:  02/02/2016   ID:  Dwayne [name], DOB Apr 26, 1956, MRN [id number]  PCP:  Simona Huh, MD  Cardiologist: Dr. Acie Fredrickson  Chief Complaint  Patient presents with  . Follow-up    History of Present Illness:  ROMONDO [name] is a 60 y.o. male  status post STEMI treated with PTCA/DES times one of the proximal to mid LAD 01/20/16, mild nonobstructive disease in the left main, RCA and circumflex, LVEF 25-35% by visual estimate. 2-D echo 01/21/16 EF 30-35% with mild aortic insufficiency and mild MR.  Patient was discharged 01/22/16 and began feeling dizzy diaphoretic and short of breath. He took a sublingual nitroglycerin and aspirin and was found to be hypotensive with a systolic blood pressure in the 80s. Medications were adjusted as blood pressure improved. MRI of the brain showed 2 small left occipital infarctions no evidence of ischemia. Carotid Dopplers showed no critical abnormalities and he was discharged by neurology.  Because of his low EF and frequent ventricular ectopy a life vest was placed.  Patient comes in today feeling quite well. Denies chest pain, palpitations, dyspnea, dyspnea on exertion, dizziness or presyncope. LifeVest has not gone off. Wants to drive his motorcycle.    Past Medical History:  Diagnosis Date  . Adenomatous colon polyp 2000  . Diverticulosis   . Erectile dysfunction   . Hypercholesteremia   . Hypertension   . Internal hemorrhoids   . Kidney stones   . Splenomegaly     Past Surgical History:  Procedure Laterality Date  . CARDIAC CATHETERIZATION N/A 01/20/2016   Procedure: Left Heart Cath and Coronary Angiography;  Surgeon: Burnell Blanks, MD; LAD 100%, D1 100%, multi-vessel dz 10-30% in other vessels, EF 25-35%, elevated LVEDP  . CARDIAC CATHETERIZATION N/A 01/20/2016   Procedure: Coronary Stent Intervention;  Surgeon: Burnell Blanks, MD; PROMUS PREM MR 3.5X16 mm DES LAD, D1 jailed by the stent but  was already occluded  . CHOLECYSTECTOMY  2009  . EYE SURGERY     Age 77  . [place] SURGERY  1993  . REFRACTIVE SURGERY  2008  . SHOULDER SURGERY  1998   Left shoulder fracture-clavicle  . WRIST FRACTURE SURGERY  1995   Bilateral    Current Medications: Outpatient Medications Prior to Visit  Medication Sig Dispense Refill  . aspirin 81 MG chewable tablet Chew 1 tablet (81 mg total) by mouth daily.    . carvedilol (COREG) 12.5 MG tablet Take 1 tablet (12.5 mg total) by mouth 2 (two) times daily with a meal. 60 tablet 11  . lisinopril (PRINIVIL,ZESTRIL) 5 MG tablet Take 1 tablet (5 mg total) by mouth daily. 30 tablet 11  . Multiple Vitamin (MULTIVITAMIN WITH MINERALS) TABS tablet Take 1 tablet by mouth daily.    . nitroGLYCERIN (NITROSTAT) 0.4 MG SL tablet Place 1 tablet (0.4 mg total) under the tongue every 5 (five) minutes as needed for chest pain. 25 tablet 3  . Omega-3 Fatty Acids (FISH OIL) 1000 MG CAPS Take 1,000 mg by mouth daily.    Marland Kitchen OVER THE COUNTER MEDICATION Take 1 capsule by mouth daily. Bile Acid factor    . OVER THE COUNTER MEDICATION Apply 1 application topically daily as needed (neck pain). CBD Oil    . rosuvastatin (CRESTOR) 40 MG tablet Take 1 tablet (40 mg total) by mouth daily at 6 PM. 30 tablet 11  . ticagrelor (BRILINTA) 90 MG TABS tablet Take 1 tablet (90 mg total)  by mouth 2 (two) times daily. 60 tablet 11  . aspirin 325 MG tablet Take 325 mg by mouth once.     No facility-administered medications prior to visit.      Allergies:   Codeine and Lipitor [atorvastatin]   Social History   Social History  . Marital status: Married    Spouse name: N/A  . Number of children: 1  . Years of education: N/A   Occupational History  . Research Tech    Social History Main Topics  . Smoking status: Never Smoker  . Smokeless tobacco: Never Used  . Alcohol use 0.0 oz/week     Comment: 2+ per week  . Drug use: No  . Sexual activity: Not Asked   Other Topics  Concern  . None   Social History Narrative  . None     Family History:  The patient's   family history includes CVA in his brother and father; Heart disease in his father.   ROS:   Please see the history of present illness.    Review of Systems  Constitution: Positive for weakness.  HENT: Negative.   Cardiovascular: Negative.   Respiratory: Negative.   Endocrine: Negative.   Hematologic/Lymphatic: Negative.   Musculoskeletal: Negative.   Gastrointestinal: Negative.   Genitourinary: Negative.    All other systems reviewed and are negative.   PHYSICAL EXAM:   VS:  BP 102/76   Pulse 68   Ht 5\' 10"  (1.778 m)   Wt 218 lb (98.9 kg)   SpO2 95%   BMI 31.28 kg/m   Physical Exam  GEN: Well nourished, well developed, in no acute distress  Neck: no JVD, carotid bruits, or masses Cardiac:RRR; no murmurs, rubs, or gallops  Respiratory:  clear to auscultation bilaterally, normal work of breathing GI: soft, nontender, nondistended, + BS [id number] groin without hematoma or hemorrhage at cath site otherwise lower extremities without cyanosis, clubbing, or edema, Good distal pulses bilaterally MS: no deformity or atrophy  Skin: warm and dry, no rash Psych: euthymic mood, full affect  Wt Readings from Last 3 Encounters:  02/02/16 218 lb (98.9 kg)  02/02/16 218 lb (98.9 kg)  01/22/16 217 lb (98.4 kg)      Studies/Labs Reviewed:   EKG:  EKG is Not ordered today.    Recent Labs: 01/22/2016: Hemoglobin 17.2; Platelets 239 01/23/2016: ALT 40; B Natriuretic Peptide 179.6; BUN 19; Creatinine, Ser 1.07; Magnesium 2.2; Potassium 4.0; Sodium 136   Lipid Panel    Component Value Date/Time   CHOL 216 (H) 01/20/2016 1615   TRIG 175 (H) 01/20/2016 1615   HDL 42 01/20/2016 1615   CHOLHDL 5.1 01/20/2016 1615   VLDL 35 01/20/2016 1615   LDLCALC 139 (H) 01/20/2016 1615    Additional studies/ records that were reviewed today include:  CATH: 08/23  Mid RCA lesion, 10 %stenosed.  Ost  1st Mrg to 1st Mrg lesion, 20 %stenosed.  2nd Mrg lesion, 20 %stenosed.  Mid Cx lesion, 10 %stenosed.  Prox Cx to Mid Cx lesion, 10 %stenosed.  Ost LM to LM lesion, 30 %stenosed.  Ost 1st Diag to 1st Diag lesion, 100 %stenosed.  A STENT PROMUS PREM MR 3.5X16 drug eluting stent was successfully placed. D1 was jailed  Prox LAD lesion, 100 %stenosed.  Post intervention, there is a 0% residual stenosis.  Mid LAD lesion, 20 %stenosed.  There is moderate left ventricular systolic dysfunction.  LV end diastolic pressure is mildly elevated.  The left ventricular ejection fraction is  25-35% by visual estimate.  There is no mitral valve regurgitation.  1. Acute anterior STEMI secondary to occluded proximal LAD 2. Successful PTCA/DES x 1 proximal to mid LAD 3. Mild disease in the left main, RCA and Circumflex 4. Moderate segmental LV systolic dysfunction  Recommendations: Will continue DAPT for one year with ASA and Brilinta. Will start beta blocker and change statin to Crestor. Will continue Ace-inh.    ECHO: 08/24 - Left ventricle: The cavity size was normal. Wall thickness was   increased in a pattern of mild LVH. Systolic function was   moderately to severely reduced. The estimated ejection fraction   was in the range of 30% to 35%. - Aortic valve: There was mild regurgitation. - Mitral valve: There was mild regurgitation. - Impressions: Patient with ventricular bigeminy throughout study.  Impressions:  - Patient with ventricular bigeminy throughout study. _____________   History of Present Illness     60 yo male with history of HTN, HLD, was admitted 08/23 w/ ant STEMI.   Hospital Course     Consultants: None    He was taken directly to the Cath Lab. Cardiac catheterization results are above. His occiput LAD was occluded and the ostial first diagonal was occluded as well. The LAD was opened and a drug-eluting stent was placed. The diagonal was already occluded and is  jailed by the stent. EF was 25-35 percent at cath.   He tolerated the procedure well. He was continued on his home dose of lisinopril, and carvedilol was added as well as high-dose Crestor, aspirin, and Brilinta.   He was noted to have some mild heart failure by exam. He was started on medications for his ischemic cardiomyopathy and did not require IV Lasix. He is encouraged to stick to a low sodium diet and contact us if he has problems with edema or shortness of breath.   He was seen by cardiac rehabilitation and educated on MI restrictions, exercise guidelines, and heart healthy lifestyle modifications. He was referred to outpatient cardiac rehabilitation and is encouraged to attend.   On 01/22/2016, he was seen by Dr. Acie Fredrickson and all data were reviewed. He ambulated 900 feet with cardiac rehabilitation and did well with this. No further inpatient workup was indicated and he is considered stable for discharge, to follow up as an outpatient.   _____________ US Carotid, 01/23/2016 Summary: Bilateral: mild mixed plaque origin ICA. Mild soft plaque proximal ICA and ECA. Vertebral artery flow is antegrade. 1-39% ICA plaquing. Left ECA velocities are elevated.   Other specific details can be found in the table(s) above. Prepared and Electronically Authenticated by   Leia Alf, MD 2017-08-27T16:55:     ASSESSMENT:    1. Acute ST elevation myocardial infarction (STEMI) involving left anterior descending (LAD) coronary artery ([place])   2. Coronary artery disease involving native coronary artery of native heart without angina pectoris   3. Chronic systolic CHF (congestive heart failure), NYHA class 1 ([place])   4. Near syncope      PLAN:  In order of problems listed above:  Patient with recent acute STEMI treated with DES to the LAD with nonobstructive disease elsewhere. Medical management recommended. Patient had readmission with dizziness and near syncope after taking nitroglycerin  felt secondary to hypotension. LifeVest placed because of ventricular ectopy and LV dysfunction. He is doing well now on lower dose medications and has had no angina.  Chronic systolic heart failure EF 30-35% no evidence of heart failure on exam today.  We'll repeat 2-D echo in 3 months from MI.  Near syncope causing hospitalization after he was sent home with no recurrence since medications adjusted.    Medication Adjustments/Labs and Tests Ordered: Current medicines are reviewed at length with the patient today.  Concerns regarding medicines are outlined above.  Medication changes, Labs and Tests ordered today are listed in the Patient Instructions below. Patient Instructions  Schedule Echo   Your physician recommends that you schedule a follow-up appointment in: 6 weeks with Dr.Nahser.     Signed, Ermalinda Barrios, PA-C  02/02/2016 1:59 PM    [facility] [place], [place], [place]  [address] Phone: [phone]; Fax: [phone]

## 2016-02-02 NOTE — Patient Instructions (Signed)
Schedule Echo   Your physician recommends that you schedule a follow-up appointment in: 6 weeks with Dr.Nahser.

## 2016-02-02 NOTE — Progress Notes (Signed)
Patient ID: Dwayne [name]                 DOB: March 16, 1956                      MRN: [id number]    Pharmacy Transitions of Care Visit  HPI: Dwayne [name] is a 60 y.o. male discharged on 01/22/16 with primary diagnosis of STEMI. PMH is significant for HTN and HLD. Cardiac cath on 8/23 showed 100% stenosis of Ost 1st Diag to 1st Diag, treated with DES to mid LAD. LVEF 25-35%. He presented back to the ED later on 8/25 d/t near syncopal episode where he felt weak and sweaty and could not speak. Systolic BP was in the 123XX123 upon admit. Beta blocker and ACEi doses were decreased d/t hypotension and life vest was ordered. Pt discharged again on 8/27. Patient presents to clinic for pharmacy transitions of care medication reconciliation after hospital discharge.  All medications have been reviewed with patient. Pt appropriately discharged on BB, ACEi, high intensity statin, and prn ntg. Med list shows ASA 81 and 325mg  daily. Cardiac rehab referral was made.   Reports he has felt much better since BB and ACEi dose reductions in the hospital. Reports his home readings have primarily been 110/70s. Wife states she bought him a pill box to help him organize his medications.  Issues/concerns noted are as follows: -ASA 81mg  and 325mg  on med list. Pt reports only taking ASA 81mg .  Past Medical History:  Diagnosis Date  . Adenomatous colon polyp 2000  . Diverticulosis   . Erectile dysfunction   . Hypercholesteremia   . Hypertension   . Internal hemorrhoids   . Kidney stones   . Splenomegaly     Current Outpatient Prescriptions on File Prior to Visit  Medication Sig Dispense Refill  . aspirin 325 MG tablet Take 325 mg by mouth once.    Marland Kitchen aspirin 81 MG chewable tablet Chew 1 tablet (81 mg total) by mouth daily.    . carvedilol (COREG) 12.5 MG tablet Take 1 tablet (12.5 mg total) by mouth 2 (two) times daily with a meal. 60 tablet 11  . lisinopril (PRINIVIL,ZESTRIL) 5 MG tablet Take 1 tablet (5 mg total)  by mouth daily. 30 tablet 11  . Multiple Vitamin (MULTIVITAMIN WITH MINERALS) TABS tablet Take 1 tablet by mouth daily.    . nitroGLYCERIN (NITROSTAT) 0.4 MG SL tablet Place 1 tablet (0.4 mg total) under the tongue every 5 (five) minutes as needed for chest pain. 25 tablet 3  . Omega-3 Fatty Acids (FISH OIL) 1000 MG CAPS Take 1,000 mg by mouth daily.    Marland Kitchen OVER THE COUNTER MEDICATION Take 1 capsule by mouth daily. Bile Acid factor    . OVER THE COUNTER MEDICATION Apply 1 application topically daily as needed (neck pain). CBD Oil    . rosuvastatin (CRESTOR) 40 MG tablet Take 1 tablet (40 mg total) by mouth daily at 6 PM. 30 tablet 11  . ticagrelor (BRILINTA) 90 MG TABS tablet Take 1 tablet (90 mg total) by mouth 2 (two) times daily. 60 tablet 11   No current facility-administered medications on file prior to visit.     Allergies  Allergen Reactions  . Codeine Nausea Only  . Lipitor [Atorvastatin] Other (See Comments)    Myalgia    Assessment/Plan:  1. Transitions of care med rec: Pt appropriately started on beta blocker, ACEi, high intensity statin, NTG, and ASA 81mg  post ACS  event. Cardiac rehab referral made. Discussed medications in depth with pt and his wife. BP still soft but stable. Pt will continue to monitor BP at home and call if systolic drops too low.   Juston Goheen E. Kadience Macchi, PharmD, [place] [id number] [address], [place], [place] [address] Phone: [phone]; Fax: [phone] 02/02/2016 11:52 AM

## 2016-02-17 ENCOUNTER — Ambulatory Visit (HOSPITAL_COMMUNITY): Payer: Commercial Managed Care - HMO

## 2016-03-23 ENCOUNTER — Ambulatory Visit (INDEPENDENT_AMBULATORY_CARE_PROVIDER_SITE_OTHER): Payer: Commercial Managed Care - HMO | Admitting: Nurse Practitioner

## 2016-03-23 ENCOUNTER — Encounter: Payer: Self-pay | Admitting: Nurse Practitioner

## 2016-03-23 ENCOUNTER — Encounter: Payer: Self-pay | Admitting: Cardiovascular Disease

## 2016-03-23 ENCOUNTER — Ambulatory Visit (INDEPENDENT_AMBULATORY_CARE_PROVIDER_SITE_OTHER): Payer: Commercial Managed Care - HMO | Admitting: Cardiovascular Disease

## 2016-03-23 ENCOUNTER — Encounter (INDEPENDENT_AMBULATORY_CARE_PROVIDER_SITE_OTHER): Payer: Self-pay

## 2016-03-23 VITALS — BP 118/80 | HR 65 | Ht 70.0 in | Wt 219.4 lb

## 2016-03-23 VITALS — BP 108/76 | HR 64 | Ht 70.0 in | Wt 219.4 lb

## 2016-03-23 DIAGNOSIS — I2510 Atherosclerotic heart disease of native coronary artery without angina pectoris: Secondary | ICD-10-CM

## 2016-03-23 DIAGNOSIS — I10 Essential (primary) hypertension: Secondary | ICD-10-CM

## 2016-03-23 DIAGNOSIS — E7800 Pure hypercholesterolemia, unspecified: Secondary | ICD-10-CM

## 2016-03-23 DIAGNOSIS — I5022 Chronic systolic (congestive) heart failure: Secondary | ICD-10-CM

## 2016-03-23 DIAGNOSIS — I639 Cerebral infarction, unspecified: Secondary | ICD-10-CM

## 2016-03-23 DIAGNOSIS — I2102 ST elevation (STEMI) myocardial infarction involving left anterior descending coronary artery: Secondary | ICD-10-CM | POA: Diagnosis not present

## 2016-03-23 HISTORY — DX: Atherosclerotic heart disease of native coronary artery without angina pectoris: I25.10

## 2016-03-23 HISTORY — DX: Cerebral infarction, unspecified: I63.9

## 2016-03-23 LAB — LIPID PANEL
CHOLESTEROL: 109 mg/dL — AB (ref 125–200)
HDL: 30 mg/dL — ABNORMAL LOW (ref >=40)
LDL Cholesterol: 52 mg/dL (ref <130)
TRIGLYCERIDES: 133 mg/dL (ref <150)
Total CHOL/HDL Ratio: 3.6 Ratio (ref <=5.0)
VLDL: 27 mg/dL (ref <30)

## 2016-03-23 LAB — COMPREHENSIVE METABOLIC PANEL
ALBUMIN: 4.3 g/dL (ref 3.6–5.1)
ALK PHOS: 55 U/L (ref 40–115)
ALT: 34 U/L (ref 9–46)
AST: 34 U/L (ref 10–35)
BUN: 10 mg/dL (ref 7–25)
CHLORIDE: 103 mmol/L (ref 98–110)
CO2: 27 mmol/L (ref 20–31)
CREATININE: 0.91 mg/dL (ref 0.70–1.25)
Calcium: 9.3 mg/dL (ref 8.6–10.3)
Glucose, Bld: 90 mg/dL (ref 65–99)
Potassium: 4.2 mmol/L (ref 3.5–5.3)
SODIUM: 138 mmol/L (ref 135–146)
TOTAL PROTEIN: 6.9 g/dL (ref 6.1–8.1)
Total Bilirubin: 0.8 mg/dL (ref 0.2–1.2)
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
## 2016-03-23 NOTE — Patient Instructions (Signed)
Medication Instructions:  Your physician recommends that you continue on your current medications as directed. Please refer to the Current Medication list given to you today.   Labwork: TODAY - cholesterol, complete metabolic panel   Testing/Procedures: Your physician has requested that you have an echocardiogram scheduled on 04/20/16 at our office. Echocardiography is a painless test that uses sound waves to create images of your heart. It provides your doctor with information about the size and shape of your heart and how well your heart's chambers and valves are working. This procedure takes approximately one hour. There are no restrictions for this procedure.   Follow-Up: Your physician recommends that you schedule a follow-up appointment in: 3 months with Dr. Acie Fredrickson   If you need a refill on your cardiac medications before your next appointment, please call your pharmacy.   Thank you for choosing [facility]! Christen Bame, RN [phone]

## 2016-03-23 NOTE — Patient Instructions (Addendum)
Stressed the importance of management of risk factors to prevent further stroke/TIA Continue DAPT aspirin and , Brilinta for secondary stroke prevention, recent STEMI  Maintain strict control of hypertension with blood pressure goal below 130/90, today's reading 118/80 continue Coreg and Lisinopril Cholesterol with LDL cholesterol less than 70, most recent 139 continue Crestor Exercise by walking, slowly increase , eat healthy diet with whole grains,  fresh fruits and vegetables Follow up in 4 months

## 2016-03-23 NOTE — Progress Notes (Signed)
[facility]  PATIENT: Dwayne [name] DOB: 1955-07-18   REASON FOR VISIT: Hospital follow-up for stroke HISTORY FROM: Patient and wife    HISTORY OF PRESENT ILLNESS:Jericho V Permaris an 60 y.o.malewho was recently diagnosed with a STEMI and was discharged after appropriate evaluation and treatment. Later that  evening he was at a restaurant with his wife and some friends reports they were sitting in a semicircular booth when he began to feel diaphoretic with shortness of breath and generalized fatigue. He took a sublingual nitroglycerin tablet and an aspirin. His wife called 32 and he came back to the hospital for these symptoms. He has a known low ejection fraction of 25% and was experiencing bigeminy while in the hospital. He was complaining of some heaviness of his left arm. MRI of the brain  Revealed 2 small left occipital infarctions. There is no evidence of any ischemia in the middle cerebral artery distribution on the right. He reports the symptoms have subsequently resolved. CTA of the head and neck with mild athro at bilateral ICA bifurcations but no significant stenosis. Carotid ultrasound without stenosis. 2-D echo EF 30-35% no cardiac source identified LDL 139 hemoglobin A1c 5.1. He returns to the clinic today for stroke follow up without recurrent stroke or TIA symptoms. He is currently on DAPT with aspirin and Brilinta. He is on Crestor 40 mg daily he had repeat lipids this morning. He returns for reevaluation   REVIEW OF SYSTEMS: Full 14 system review of systems performed and notable only for those listed, all others are neg:  Constitutional: neg  Cardiovascular: neg Ear/Nose/Throat: neg  Skin: neg Eyes: neg Respiratory: neg Gastroitestinal: neg  Hematology/Lymphatic: neg  Endocrine: neg Musculoskeletal: Neck pain Allergy/Immunology: neg Neurological: neg Psychiatric: neg Sleep : Snoring   ALLERGIES: Allergies  Allergen Reactions  .  Codeine Nausea Only  . Lipitor [Atorvastatin] Other (See Comments)    Myalgia    HOME MEDICATIONS: Outpatient Medications Prior to Visit  Medication Sig Dispense Refill  . aspirin 81 MG chewable tablet Chew 1 tablet (81 mg total) by mouth daily.    . carvedilol (COREG) 12.5 MG tablet Take 1 tablet (12.5 mg total) by mouth 2 (two) times daily with a meal. 60 tablet 11  . lisinopril (PRINIVIL,ZESTRIL) 5 MG tablet Take 1 tablet (5 mg total) by mouth daily. 30 tablet 11  . Multiple Vitamin (MULTIVITAMIN WITH MINERALS) TABS tablet Take 1 tablet by mouth daily.    . nitroGLYCERIN (NITROSTAT) 0.4 MG SL tablet Place 1 tablet (0.4 mg total) under the tongue every 5 (five) minutes as needed for chest pain. 25 tablet 3  . Omega-3 Fatty Acids (FISH OIL) 1000 MG CAPS Take 1,000 mg by mouth daily.    . rosuvastatin (CRESTOR) 40 MG tablet Take 1 tablet (40 mg total) by mouth daily at 6 PM. 30 tablet 11  . ticagrelor (BRILINTA) 90 MG TABS tablet Take 1 tablet (90 mg total) by mouth 2 (two) times daily. 60 tablet 11  . OVER THE COUNTER MEDICATION Apply 1 application topically daily as needed (neck pain). CBD Oil     No facility-administered medications prior to visit.     PAST MEDICAL HISTORY: Past Medical History:  Diagnosis Date  . Adenomatous colon polyp 2000  . Diverticulosis   . Erectile dysfunction   . Hypercholesteremia   . Hypertension   . Internal hemorrhoids   . Kidney stones   . Splenomegaly     PAST SURGICAL HISTORY: Past Surgical History:  Procedure Laterality Date  . CARDIAC CATHETERIZATION N/A 01/20/2016   Procedure: Left Heart Cath and Coronary Angiography;  Surgeon: Burnell Blanks, MD; LAD 100%, D1 100%, multi-vessel dz 10-30% in other vessels, EF 25-35%, elevated LVEDP  . CARDIAC CATHETERIZATION N/A 01/20/2016   Procedure: Coronary Stent Intervention;  Surgeon: Burnell Blanks, MD; PROMUS PREM MR 3.5X16 mm DES LAD, D1 jailed by the stent but was already occluded    . CHOLECYSTECTOMY  2009  . EYE SURGERY     Age 43  . [place] SURGERY  1993  . REFRACTIVE SURGERY  2008  . SHOULDER SURGERY  1998   Left shoulder fracture-clavicle  . WRIST FRACTURE SURGERY  1995   Bilateral    FAMILY HISTORY: Family History  Problem Relation Age of Onset  . Heart disease Father   . CVA Father   . CVA Brother     SOCIAL HISTORY: Social History   Social History  . Marital status: Married    Spouse name: N/A  . Number of children: 1  . Years of education: N/A   Occupational History  . Research Tech    Social History Main Topics  . Smoking status: Never Smoker  . Smokeless tobacco: Never Used  . Alcohol use 0.0 oz/week     Comment: 2+ per week  . Drug use: No  . Sexual activity: Not on file   Other Topics Concern  . Not on file   Social History Narrative  . No narrative on file     PHYSICAL EXAM  Vitals:   03/23/16 1027  BP: 118/80  Pulse: 65  Weight: 219 lb 6.4 oz (99.5 kg)  Height: 5\' 10"  (1.778 m)   Body mass index is 31.48 kg/m.  Generalized: Well developed, Obese male in no acute distress  Head: normocephalic and atraumatic,. Oropharynx benign  Neck: Supple, no carotid bruits  Cardiac: Regular rate rhythm, no murmur  Musculoskeletal: No deformity   Neurological examination   Mentation: Alert oriented to time, place, history taking. Attention span and concentration appropriate. Recent and remote memory intact.  Follows all commands speech and language fluent.   Cranial nerve II-XII: Pupils were equal round reactive to light extraocular movements were full, visual field were full on confrontational test. Facial sensation and strength were normal. hearing was intact to finger rubbing bilaterally. Uvula tongue midline. head turning and shoulder shrug were normal and symmetric.Tongue protrusion into cheek strength was normal. Motor: normal bulk and tone, full strength in the BUE, BLE, fine finger movements normal, no pronator  drift. No focal weakness Sensory: normal and symmetric to light touch, pinprick, and  Vibration, in the upper and lower extremities Coordination: finger-nose-finger, heel-to-shin bilaterally, no dysmetria Reflexes: 1+ upper lower and symmetric, plantar responses were flexor bilaterally. Gait and Station: Rising up from seated position without assistance, normal stance,  moderate stride, good arm swing, smooth turning, able to perform tiptoe, and heel walking without difficulty. Tandem gait is steady  DIAGNOSTIC DATA (LABS, IMAGING, TESTING) - I reviewed patient records, labs, notes, testing and imaging myself where available.  Lab Results  Component Value Date   WBC 15.5 (H) 01/22/2016   HGB 17.2 (H) 01/22/2016   HCT 48.1 01/22/2016   MCV 93.8 01/22/2016   PLT 239 01/22/2016      Component Value Date/Time   NA 136 01/23/2016 0049   K 4.0 01/23/2016 0049   CL 102 01/23/2016 0049   CO2 24 01/23/2016 0049   GLUCOSE 118 (H) 01/23/2016  0049   BUN 19 01/23/2016 0049   CREATININE 1.07 01/23/2016 0049   CALCIUM 8.9 01/23/2016 0049   PROT 6.2 (L) 01/23/2016 0049   ALBUMIN 3.4 (L) 01/23/2016 0049   AST 57 (H) 01/23/2016 0049   ALT 40 01/23/2016 0049   ALKPHOS 59 01/23/2016 0049   BILITOT 1.2 01/23/2016 0049   GFRNONAA >60 01/23/2016 0049   GFRAA >60 01/23/2016 0049   Lab Results  Component Value Date   CHOL 216 (H) 01/20/2016   HDL 42 01/20/2016   LDLCALC 139 (H) 01/20/2016   TRIG 175 (H) 01/20/2016   CHOLHDL 5.1 01/20/2016   Lab Results  Component Value Date   HGBA1C 5.1 01/20/2016    ASSESSMENT AND PLAN  60 y.o. year old male  has a past medical history of Hypercholesteremia; Hypertension; coronary artery disease with recent MI left ventricular dysfunction with EF 25-35% by heart cath and ventricular bigeminy presenting with fatigue and diaphoresis and left arm heaviness. Recent STEMI procedure.MRI of the brain  Revealed 2 small left occipital infarctions. The patient is a  current patient of Dr. Erlinda Hong  who is out of the office today . This note is sent to the work in doctor.       PLAN: Stressed the importance of management of risk factors to prevent further stroke/TIA Continue DAPT aspirin and  Brilinta for secondary stroke prevention, recent STEMI  Maintain strict control of hypertension with blood pressure goal below 130/90, today's reading 118/80 continue Coreg and Lisinopril Cholesterol with LDL cholesterol less than 70, most recent 139 continue Crestor Exercise by walking, slowly increase , eat healthy diet with whole grains,  fresh fruits and vegetables Follow up in 4 months Discussed risk for recurrent stroke/ TIA and answered additional questions This was a visit requiring 30 minutes and medical decision making of high complexity with extensive review of history, hospital chart, counseling and answering questions Dennie Bible, [facility], [facility], APRN  [facility] [address], [place], [place] [address] [phone]

## 2016-03-24 ENCOUNTER — Telehealth: Payer: Self-pay | Admitting: Cardiovascular Disease

## 2016-03-24 NOTE — Progress Notes (Signed)
I reviewed note and agree with plan.   Penni Bombard, MD [id number], [id number] PM Certified in Neurology, Neurophysiology and Neuroimaging  [facility] [address], [place], [place] [address] [phone]

## 2016-03-24 NOTE — Telephone Encounter (Signed)
Spoke with Lanelle Bal from [employer], and let her know that the pt brought the box with the life vest. The box was peaked up by [employer] yesterday after Lunch to be deliver to your company. Lanelle Bal wants to know the box tracking number.  Janan Halter left a message to her  that there is not a tracking number . The box had a return label, And for natalie to call her ph # directed if have any questions.

## 2016-03-24 NOTE — Telephone Encounter (Signed)
Follow up    Patient calling back regarding test results.

## 2016-03-24 NOTE — Progress Notes (Signed)
I reviewed above note and agree with the assessment and plan.  Rosalin Hawking, MD PhD Stroke Neurology 03/24/2016 11:36 PM

## 2016-03-24 NOTE — Telephone Encounter (Signed)
New message  Lanelle Bal From [employer] called asking if pt left life vest in cardiology office. She states pt says he left it here. Please call back to discuss

## 2016-03-24 NOTE — Telephone Encounter (Signed)
Informed pt of lab results. Pt verbalized understanding. 

## 2016-04-20 ENCOUNTER — Ambulatory Visit (HOSPITAL_COMMUNITY): Payer: Commercial Managed Care - HMO | Attending: Cardiology

## 2016-04-20 ENCOUNTER — Other Ambulatory Visit: Payer: Self-pay

## 2016-04-20 DIAGNOSIS — I252 Old myocardial infarction: Secondary | ICD-10-CM | POA: Diagnosis not present

## 2016-04-20 DIAGNOSIS — I2510 Atherosclerotic heart disease of native coronary artery without angina pectoris: Secondary | ICD-10-CM | POA: Diagnosis not present

## 2016-04-20 DIAGNOSIS — I5022 Chronic systolic (congestive) heart failure: Secondary | ICD-10-CM | POA: Diagnosis not present

## 2016-04-20 DIAGNOSIS — I110 Hypertensive heart disease with heart failure: Secondary | ICD-10-CM | POA: Insufficient documentation

## 2016-04-20 DIAGNOSIS — I2102 ST elevation (STEMI) myocardial infarction involving left anterior descending coronary artery: Secondary | ICD-10-CM

## 2016-04-20 DIAGNOSIS — Z8249 Family history of ischemic heart disease and other diseases of the circulatory system: Secondary | ICD-10-CM | POA: Insufficient documentation

## 2016-04-20 DIAGNOSIS — E785 Hyperlipidemia, unspecified: Secondary | ICD-10-CM | POA: Insufficient documentation

## 2016-06-23 ENCOUNTER — Encounter (INDEPENDENT_AMBULATORY_CARE_PROVIDER_SITE_OTHER): Payer: Self-pay

## 2016-06-23 ENCOUNTER — Encounter: Payer: Self-pay | Admitting: Cardiovascular Disease

## 2016-06-23 ENCOUNTER — Ambulatory Visit (INDEPENDENT_AMBULATORY_CARE_PROVIDER_SITE_OTHER): Payer: Commercial Managed Care - HMO | Admitting: Cardiovascular Disease

## 2016-06-23 VITALS — BP 130/90 | HR 76 | Ht 70.0 in | Wt 223.8 lb

## 2016-06-23 DIAGNOSIS — I5022 Chronic systolic (congestive) heart failure: Secondary | ICD-10-CM | POA: Diagnosis not present

## 2016-06-23 DIAGNOSIS — I2510 Atherosclerotic heart disease of native coronary artery without angina pectoris: Secondary | ICD-10-CM | POA: Diagnosis not present

## 2016-06-23 NOTE — Patient Instructions (Signed)
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
## 2016-07-12 ENCOUNTER — Encounter (HOSPITAL_COMMUNITY): Payer: Self-pay | Admitting: Family Medicine

## 2016-07-12 ENCOUNTER — Telehealth (HOSPITAL_COMMUNITY): Payer: Self-pay | Admitting: Family Medicine

## 2016-07-12 DIAGNOSIS — E7800 Pure hypercholesterolemia, unspecified: Secondary | ICD-10-CM | POA: Diagnosis not present

## 2016-07-12 DIAGNOSIS — R7309 Other abnormal glucose: Secondary | ICD-10-CM | POA: Diagnosis not present

## 2016-07-12 DIAGNOSIS — Z0000 Encounter for general adult medical examination without abnormal findings: Secondary | ICD-10-CM | POA: Diagnosis not present

## 2016-07-12 DIAGNOSIS — Z125 Encounter for screening for malignant neoplasm of prostate: Secondary | ICD-10-CM | POA: Diagnosis not present

## 2016-07-12 NOTE — Telephone Encounter (Signed)
Mailed letter with [facility] along with my chart message... KJ  °

## 2016-07-26 ENCOUNTER — Encounter: Payer: Self-pay | Admitting: Nurse Practitioner

## 2016-07-26 ENCOUNTER — Ambulatory Visit (INDEPENDENT_AMBULATORY_CARE_PROVIDER_SITE_OTHER): Payer: Commercial Managed Care - HMO | Admitting: Nurse Practitioner

## 2016-07-26 VITALS — BP 118/74 | HR 73 | Resp 20 | Ht 70.0 in | Wt 227.0 lb

## 2016-07-26 DIAGNOSIS — I639 Cerebral infarction, unspecified: Secondary | ICD-10-CM

## 2016-07-26 DIAGNOSIS — E7800 Pure hypercholesterolemia, unspecified: Secondary | ICD-10-CM

## 2016-07-26 DIAGNOSIS — I5022 Chronic systolic (congestive) heart failure: Secondary | ICD-10-CM | POA: Diagnosis not present

## 2016-07-26 DIAGNOSIS — I10 Essential (primary) hypertension: Secondary | ICD-10-CM | POA: Diagnosis not present

## 2016-07-26 DIAGNOSIS — H00014 Hordeolum externum left upper eyelid: Secondary | ICD-10-CM | POA: Diagnosis not present

## 2016-07-26 NOTE — Patient Instructions (Addendum)
Continue DAPT aspirin and  Brilinta for secondary stroke prevention, recent STEMI  Maintain strict control of hypertension with blood pressure goal below 130/90, today's reading 118/74 continue Coreg and Lisinopril Cholesterol with LDL cholesterol less than 70, continue Crestor Exercise by walking, , eat healthy diet with whole grains,  fresh fruits and vegetables Follow up in 6 months

## 2016-07-26 NOTE — Progress Notes (Signed)
[facility]  PATIENT: Dwayne [name] DOB: June 28, 1955   REASON FOR VISIT:  follow-up for left occipital stroke  HISTORY FROM: Patient and wife    HISTORY OF PRESENT ILLNESS:UPDATE 02/27/2018CM Dwayne [name], 61 year old returns for follow up for left occipital stroke which occurred in August 2017. He is status post DBS placed August 2017. He also has a history of chronic systolic congestive heart failure and his echo has improved to 40-45% in November 2017. He is currently on dual antiplatelet therapy of aspirin and Brilinta. He complains of bruising easily but he has had no bleeding. He is also on Crestor without complaints of myalgias. Reviewed recent lipids and CMP from 03/23/2016. 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 He gets little exercise. He denies further stroke or TIA symptoms. He claims his wife says he snores however he denies any daytime drowsiness or morning headache. He returns for reevaluation   HISTORY: 10/25/17CMGregory V [name] an 49 [name] was recently diagnosed with a STEMI and was discharged after appropriate evaluation and treatment. Later that  evening he was at a restaurant with his wife and some friends reports they were sitting in a semicircular booth when he began to feel diaphoretic with shortness of breath and generalized fatigue. He took a sublingual nitroglycerin tablet and an aspirin. His wife called 50 and he came back to the hospital for these symptoms. He has a known low ejection fraction of 25% and was experiencing bigeminy while in the hospital. He was complaining of some heaviness of his left arm. MRI of the brain  Revealed 2 small left occipital infarctions. There is no evidence of any ischemia in the middle cerebral artery distribution on the right. He reports the symptoms have subsequently resolved. CTA of the head and neck with mild athro at bilateral ICA bifurcations but no significant stenosis. Carotid  ultrasound without stenosis. 2-D echo EF 30-35% no cardiac source identified LDL 139 hemoglobin A1c 5.1. He returns to the clinic today for stroke follow up without recurrent stroke or TIA symptoms. He is currently on DAPT with aspirin and Brilinta. He is on Crestor 40 mg daily he had repeat lipids this morning. He returns for reevaluation   REVIEW OF SYSTEMS: Full 14 system review of systems performed and notable only for those listed, all others are neg:  Constitutional: neg  Cardiovascular: neg Ear/Nose/Throat: neg  Skin: neg Eyes: neg Respiratory: neg Gastroitestinal: neg  Hematology/Lymphatic: Easy bruising  Endocrine: neg Musculoskeletal: neg Allergy/Immunology: neg Neurological: neg Psychiatric: neg Sleep : Snoring   ALLERGIES: Allergies  Allergen Reactions  . Codeine Nausea Only  . Lipitor [Atorvastatin] Other (See Comments)    Myalgia    HOME MEDICATIONS: Outpatient Medications Prior to Visit  Medication Sig Dispense Refill  . aspirin 81 MG chewable tablet Chew 1 tablet (81 mg total) by mouth daily.    . carvedilol (COREG) 12.5 MG tablet Take 1 tablet (12.5 mg total) by mouth 2 (two) times daily with a meal. 60 tablet 11  . lisinopril (PRINIVIL,ZESTRIL) 5 MG tablet Take 1 tablet (5 mg total) by mouth daily. 30 tablet 11  . Multiple Vitamin (MULTIVITAMIN WITH MINERALS) TABS tablet Take 1 tablet by mouth daily.    . nitroGLYCERIN (NITROSTAT) 0.4 MG SL tablet Place 1 tablet (0.4 mg total) under the tongue every 5 (five) minutes as needed for chest pain. 25 tablet 3  . Omega-3 Fatty Acids (FISH OIL) 1000 MG CAPS Take 1,000 mg by mouth  daily.    Marland Kitchen OVER THE COUNTER MEDICATION Apply 1 application topically daily as needed (neck pain). CBD Oil    . rosuvastatin (CRESTOR) 40 MG tablet Take 1 tablet (40 mg total) by mouth daily at 6 PM. 30 tablet 11  . ticagrelor (BRILINTA) 90 MG TABS tablet Take 1 tablet (90 mg total) by mouth 2 (two) times daily. 60 tablet 11   No  facility-administered medications prior to visit.     PAST MEDICAL HISTORY: Past Medical History:  Diagnosis Date  . Adenomatous colon polyp 2000  . Diverticulosis   . Erectile dysfunction   . Hypercholesteremia   . Hypertension   . Internal hemorrhoids   . Kidney stones   . Splenomegaly     PAST SURGICAL HISTORY: Past Surgical History:  Procedure Laterality Date  . CARDIAC CATHETERIZATION N/A 01/20/2016   Procedure: Left Heart Cath and Coronary Angiography;  Surgeon: Burnell Blanks, MD; LAD 100%, D1 100%, multi-vessel dz 10-30% in other vessels, EF 25-35%, elevated LVEDP  . CARDIAC CATHETERIZATION N/A 01/20/2016   Procedure: Coronary Stent Intervention;  Surgeon: Burnell Blanks, MD; PROMUS PREM MR 3.5X16 mm DES LAD, D1 jailed by the stent but was already occluded  . CHOLECYSTECTOMY  2009  . EYE SURGERY     Age 40  . [place] SURGERY  1993  . REFRACTIVE SURGERY  2008  . SHOULDER SURGERY  1998   Left shoulder fracture-clavicle  . WRIST FRACTURE SURGERY  1995   Bilateral    FAMILY HISTORY: Family History  Problem Relation Age of Onset  . Heart disease Father   . CVA Father   . CVA Brother     SOCIAL HISTORY: Social History   Social History  . Marital status: Married    Spouse name: N/A  . Number of children: 1  . Years of education: N/A   Occupational History  . Research Tech    Social History Main Topics  . Smoking status: Never Smoker  . Smokeless tobacco: Never Used  . Alcohol use 0.0 oz/week     Comment: 2+ per week  . Drug use: No  . Sexual activity: Not on file   Other Topics Concern  . Not on file   Social History Narrative  . No narrative on file     PHYSICAL EXAM  Vitals:   07/26/16 0741  BP: 118/74  Pulse: 73  Resp: 20  Weight: 227 lb (103 kg)  Height: 5\' 10"  (1.778 m)   Body mass index is 32.57 kg/m.  Generalized: Well developed, Obese male in no acute distress  Head: normocephalic and atraumatic,. Oropharynx  benign  Neck: Supple, no carotid bruits  Cardiac: Regular rate rhythm, no murmur  Musculoskeletal: No deformity   Neurological examination   Mentation: Alert oriented to time, place, history taking. Attention span and concentration appropriate. Recent and remote memory intact.  Follows all commands speech and language fluent.   Cranial nerve II-XII: Pupils were equal round reactive to light extraocular movements were full, visual field were full on confrontational test. Facial sensation and strength were normal. hearing was intact to finger rubbing bilaterally. Uvula tongue midline. head turning and shoulder shrug were normal and symmetric.Tongue protrusion into cheek strength was normal. Motor: normal bulk and tone, full strength in the BUE, BLE, fine finger movements normal, no pronator drift.  Sensory: normal and symmetric to light touch, pinprick, and  Vibration, in the upper and lower extremities Coordination: finger-nose-finger, heel-to-shin bilaterally, no dysmetria Reflexes: 1+  upper lower and symmetric, plantar responses were flexor bilaterally. Gait and Station: Rising up from seated position without assistance, normal stance,  moderate stride, good arm swing, smooth turning, able to perform tiptoe, and heel walking without difficulty. Tandem gait is steady  DIAGNOSTIC DATA (LABS, IMAGING, TESTING) - I reviewed patient records, labs, notes, testing and imaging myself where available.  Lab Results  Component Value Date   WBC 15.5 (H) 01/22/2016   HGB 17.2 (H) 01/22/2016   HCT 48.1 01/22/2016   MCV 93.8 01/22/2016   PLT 239 01/22/2016      Component Value Date/Time   NA 138 03/23/2016 1003   K 4.2 03/23/2016 1003   CL 103 03/23/2016 1003   CO2 27 03/23/2016 1003   GLUCOSE 90 03/23/2016 1003   BUN 10 03/23/2016 1003   CREATININE 0.91 03/23/2016 1003   CALCIUM 9.3 03/23/2016 1003   PROT 6.9 03/23/2016 1003   ALBUMIN 4.3 03/23/2016 1003   AST 34 03/23/2016 1003   ALT 34  03/23/2016 1003   ALKPHOS 55 03/23/2016 1003   BILITOT 0.8 03/23/2016 1003   GFRNONAA >60 01/23/2016 0049   GFRAA >60 01/23/2016 0049   Lab Results  Component Value Date   CHOL 109 (L) 03/23/2016   HDL 30 (L) 03/23/2016   LDLCALC 52 03/23/2016   TRIG 133 03/23/2016   CHOLHDL 3.6 03/23/2016   Lab Results  Component Value Date   HGBA1C 5.1 01/20/2016    ASSESSMENT AND PLAN  61 y.o. year old male  has a past medical history of Hypercholesteremia; Hypertension; coronary artery disease with  MI left ventricular dysfunction with EF 25-35% by heart cath and ventricular bigeminy presenting with fatigue and diaphoresis and left arm heaviness in Aug 2017. Marland Kitchen Recent STEMI procedure.MRI of the brain  Revealed 2 small left occipital infarctions. The patient is a current patient of Dr. Erlinda Hong  who is out of the office today . This note is sent to the work in doctor.       PLAN: Stressed the importance of management of continued risk factor monitoring to prevent further stroke/TIA Continue DAPT aspirin and  Brilinta for secondary stroke prevention, recent STEMI for 1 year Maintain strict control of hypertension with blood pressure goal below 130/90, today's reading 118/74 continue Coreg and Lisinopril Cholesterol with LDL cholesterol less than 70, continue Crestor Exercise by walking, continue healthy diet with whole grains,  fresh fruits and vegetables Follow up in 6 months Discussed risk for recurrent stroke/ TIA and answered additional questions This was a visit requiring 25 minutes face to face with extensive review of history, hospital chart, counseling and answering questions and review of 03/23/16 and 06/23/16 follow up visits with cardiology. Asked patient to ask wife if he stops breathing at night with his snoring, he has risk factors for OSA and may need a sleep study Dwayne [name], [facility], [facility], [facility] [address], [place], [place] [address] [phone]

## 2016-08-07 NOTE — Progress Notes (Signed)
Personally  participated in, made any corrections needed, and agree with history, physical, neuro exam,assessment and plan as stated above.    Aeliana Spates, MD [facility] 

## 2016-10-26 DIAGNOSIS — S30863A Insect bite (nonvenomous) of scrotum and testes, initial encounter: Secondary | ICD-10-CM | POA: Diagnosis not present

## 2016-10-26 DIAGNOSIS — W57XXXA Bitten or stung by nonvenomous insect and other nonvenomous arthropods, initial encounter: Secondary | ICD-10-CM | POA: Diagnosis not present

## 2017-01-07 ENCOUNTER — Other Ambulatory Visit: Payer: Self-pay | Admitting: Physician Assistant

## 2017-01-18 ENCOUNTER — Encounter: Payer: Self-pay | Admitting: Cardiovascular Disease

## 2017-01-22 NOTE — Progress Notes (Signed)
[facility]  PATIENT: Dwayne [name] DOB: [birth year]   REASON FOR VISIT:  follow-up for left occipital stroke  HISTORY FROM: Patient     HISTORY OF PRESENT ILLNESS:UPDATE 08/27/2018CM [name], 61 year old male returns for follow-up with a history of occipital stroke August 2017. He also has a history of chronic systolic congestive heart failure. He is currently on dual antiplatelet therapy of aspirin and Brilinta.He has some minimal bruising but no bleeding He is due to see Dr.Nahser cardiologist next month. He remains on Crestor without complaints of myalgias.  He goes to the gym at the [facility] [employer] [employer]. He has not had further stroke or TIA symptoms. Blood pressure in the office today 130/81. He returns for reevaluation   UPDATE 02/27/2018CM [name], 61 year old returns for follow up for left occipital stroke which occurred in August 2017. He is status post DBS placed August 2017. He also has a history of chronic systolic congestive heart failure and his echo has improved to 40-45% in November 2017. He is currently on dual antiplatelet therapy of aspirin and Brilinta. He complains of bruising easily but he has had no bleeding. He is also on Crestor without complaints of myalgias. Reviewed recent lipids and CMP from 03/23/2016. 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 He gets little exercise. He denies further stroke or TIA symptoms. He claims his wife says he snores however he denies any daytime drowsiness or morning headache. He returns for reevaluation   HISTORY: 10/25/17CMGregory V Permaris an 61 y.o.malewho was recently diagnosed with a STEMI and was discharged after appropriate evaluation and treatment. Later that  evening he was at a restaurant with his wife and some friends reports they were sitting in a semicircular booth when he began to feel diaphoretic with shortness of breath and generalized fatigue. He took a sublingual nitroglycerin tablet and  an aspirin. His wife called 94 and he came back to the hospital for these symptoms. He has a known low ejection fraction of 25% and was experiencing bigeminy while in the hospital. He was complaining of some heaviness of his left arm. MRI of the brain  Revealed 2 small left occipital infarctions. There is no evidence of any ischemia in the middle cerebral artery distribution on the right. He reports the symptoms have subsequently resolved. CTA of the head and neck with mild athro at bilateral ICA bifurcations but no significant stenosis. Carotid ultrasound without stenosis. 2-D echo EF 30-35% no cardiac source identified LDL 139 hemoglobin A1c 5.1. He returns to the clinic today for stroke follow up without recurrent stroke or TIA symptoms. He is currently on DAPT with aspirin and Brilinta. He is on Crestor 40 mg daily he had repeat lipids this morning. He returns for reevaluation   REVIEW OF SYSTEMS: Full 14 system review of systems performed and notable only for those listed, all others are neg:  Constitutional: neg  Cardiovascular: neg Ear/Nose/Throat: neg  Skin: neg Eyes: neg Respiratory: neg Gastroitestinal: neg  Hematology/Lymphatic: Easy bruising  Endocrine: neg Musculoskeletal: neg Allergy/Immunology: neg Neurological: neg Psychiatric: neg Sleep : neg  ALLERGIES: Allergies  Allergen Reactions  . Codeine Nausea Only  . Lipitor [Atorvastatin] Other (See Comments)    Myalgia    HOME MEDICATIONS: Outpatient Medications Prior to Visit  Medication Sig Dispense Refill  . aspirin 81 MG chewable tablet Chew 1 tablet (81 mg total) by mouth daily.    . carvedilol (COREG) 12.5 MG tablet TAKE 1 TABLET BY MOUTH TWICE  A DAY WITH A MEAL 60 tablet 2  . lisinopril (PRINIVIL,ZESTRIL) 5 MG tablet TAKE 1 TABLET BY MOUTH EVERY DAY 30 tablet 2  . Multiple Vitamin (MULTIVITAMIN WITH MINERALS) TABS tablet Take 1 tablet by mouth daily.    . nitroGLYCERIN (NITROSTAT) 0.4 MG SL tablet Place 1 tablet  (0.4 mg total) under the tongue every 5 (five) minutes as needed for chest pain. 25 tablet 3  . Omega-3 Fatty Acids (FISH OIL) 1000 MG CAPS Take 1,000 mg by mouth daily.    Marland Kitchen OVER THE COUNTER MEDICATION Apply 1 application topically daily as needed (neck pain). CBD Oil    . rosuvastatin (CRESTOR) 40 MG tablet TAKE 1 TABLET BY MOUTH EVERY DAY AT 6PM 30 tablet 2  . ticagrelor (BRILINTA) 90 MG TABS tablet Take 1 tablet (90 mg total) by mouth 2 (two) times daily. 60 tablet 11   No facility-administered medications prior to visit.     PAST MEDICAL HISTORY: Past Medical History:  Diagnosis Date  . Adenomatous colon polyp 2000  . Diverticulosis   . Erectile dysfunction   . Hypercholesteremia   . Hypertension   . Internal hemorrhoids   . Kidney stones   . Splenomegaly     PAST SURGICAL HISTORY: Past Surgical History:  Procedure Laterality Date  . CARDIAC CATHETERIZATION N/A 01/20/2016   Procedure: Left Heart Cath and Coronary Angiography;  Surgeon: Burnell Blanks, MD; LAD 100%, D1 100%, multi-vessel dz 10-30% in other vessels, EF 25-35%, elevated LVEDP  . CARDIAC CATHETERIZATION N/A 01/20/2016   Procedure: Coronary Stent Intervention;  Surgeon: Burnell Blanks, MD; PROMUS PREM MR 3.5X16 mm DES LAD, D1 jailed by the stent but was already occluded  . CHOLECYSTECTOMY  2009  . EYE SURGERY     Age 78  . [place] SURGERY  1993  . REFRACTIVE SURGERY  2008  . SHOULDER SURGERY  1998   Left shoulder fracture-clavicle  . WRIST FRACTURE SURGERY  1995   Bilateral    FAMILY HISTORY: Family History  Problem Relation Age of Onset  . Heart disease Father   . CVA Father   . CVA Brother     SOCIAL HISTORY: Social History   Social History  . Marital status: Married    Spouse name: N/A  . Number of children: 1  . Years of education: N/A   Occupational History  . Research Tech    Social History Main Topics  . Smoking status: Never Smoker  . Smokeless tobacco: Never Used   . Alcohol use 0.0 oz/week     Comment: 2+ per week  . Drug use: No  . Sexual activity: Not on file   Other Topics Concern  . Not on file   Social History Narrative  . No narrative on file     PHYSICAL EXAM  Vitals:   01/23/17 0858  BP: 130/81  Pulse: 70  Weight: 233 lb (105.7 kg)  Height: 5\' 10"  (1.778 m)   Body mass index is 33.43 kg/m.  Generalized: Well developed, Obese male in no acute distress  Head: normocephalic and atraumatic,. Oropharynx benign  Neck: Supple, no carotid bruits  Cardiac: Regular rate rhythm, no murmur  Musculoskeletal: No deformity   Neurological examination   Mentation: Alert oriented to time, place, history taking. Attention span and concentration appropriate. Recent and remote memory intact.  Follows all commands speech and language fluent.   Cranial nerve II-XII: Pupils were equal round reactive to light extraocular movements were full, visual field  were full on confrontational test. Facial sensation and strength were normal. hearing was intact to finger rubbing bilaterally. Uvula tongue midline. head turning and shoulder shrug were normal and symmetric.Tongue protrusion into cheek strength was normal. Motor: normal bulk and tone, full strength in the BUE, BLE, fine finger movements normal, no pronator drift.  Sensory: normal and symmetric to light touch, pinprick, and  Vibration, in the upper and lower extremities  Coordination: finger-nose-finger, heel-to-shin bilaterally, no dysmetria Reflexes: 1+ upper lower and symmetric, plantar responses were flexor bilaterally. Gait and Station: Rising up from seated position without assistance, normal stance,  moderate stride, good arm swing, smooth turning, able to perform tiptoe, and heel walking without difficulty. Tandem gait is steady. No assistive device  DIAGNOSTIC DATA (LABS, IMAGING, TESTING) - I reviewed patient records, labs, notes, testing and imaging myself where available.  Lab Results   Component Value Date   WBC 15.5 (H) 01/22/2016   HGB 17.2 (H) 01/22/2016   HCT 48.1 01/22/2016   MCV 93.8 01/22/2016   PLT 239 01/22/2016      Component Value Date/Time   NA 138 03/23/2016 1003   K 4.2 03/23/2016 1003   CL 103 03/23/2016 1003   CO2 27 03/23/2016 1003   GLUCOSE 90 03/23/2016 1003   BUN 10 03/23/2016 1003   CREATININE 0.91 03/23/2016 1003   CALCIUM 9.3 03/23/2016 1003   PROT 6.9 03/23/2016 1003   ALBUMIN 4.3 03/23/2016 1003   AST 34 03/23/2016 1003   ALT 34 03/23/2016 1003   ALKPHOS 55 03/23/2016 1003   BILITOT 0.8 03/23/2016 1003   GFRNONAA >60 01/23/2016 0049   GFRAA >60 01/23/2016 0049   Lab Results  Component Value Date   CHOL 109 (L) 03/23/2016   HDL 30 (L) 03/23/2016   LDLCALC 52 03/23/2016   TRIG 133 03/23/2016   CHOLHDL 3.6 03/23/2016   Lab Results  Component Value Date   HGBA1C 5.1 01/20/2016    ASSESSMENT AND PLAN  61 y.o. year old male  has a past medical history of Hypercholesteremia; Hypertension; coronary artery disease with  MI left ventricular dysfunction with EF 25-35% by heart cath and ventricular bigeminy presenting with fatigue and diaphoresis and left arm heaviness in Aug 2017. Marland Kitchen Recent STEMI procedure.MRI of the brain  Revealed 2 small left occipital infarctions. The patient is a current patient of Dr. Erlinda Hong  who is out of the office today . This note is sent to the work in doctor.  Patient is neurologically stable     PLAN: Stressed the importance of management of continued risk factor monitoring to prevent further stroke/TIA Continue DAPT aspirin and  Brilinta for secondary stroke prevention, stent placement Maintain strict control of hypertension with blood pressure goal below 130/90, today's reading 130/81 continue Coreg and Lisinopril Cholesterol with LDL cholesterol less than 70, continue Crestor Exercise by walking, continue healthy diet with whole grains,  fresh fruits and vegetables Will discharge Discussed risk for  recurrent stroke/ TIA and answered additional questions.Given a written handout stroke prevention This was a visit requiring 20 minutes face to face with extensive review of history,  chart, counseling and answering questions  Dennie Bible, [facility], [facility], APRN  [facility] [address] [place], [place] [address] [phone]

## 2017-01-23 ENCOUNTER — Ambulatory Visit (INDEPENDENT_AMBULATORY_CARE_PROVIDER_SITE_OTHER): Payer: 59 | Admitting: Nurse Practitioner

## 2017-01-23 ENCOUNTER — Encounter: Payer: Self-pay | Admitting: Nurse Practitioner

## 2017-01-23 VITALS — BP 130/81 | HR 70 | Ht 70.0 in | Wt 233.0 lb

## 2017-01-23 DIAGNOSIS — I10 Essential (primary) hypertension: Secondary | ICD-10-CM

## 2017-01-23 DIAGNOSIS — Z8673 Personal history of transient ischemic attack (TIA), and cerebral infarction without residual deficits: Secondary | ICD-10-CM | POA: Insufficient documentation

## 2017-01-23 DIAGNOSIS — E7800 Pure hypercholesterolemia, unspecified: Secondary | ICD-10-CM | POA: Diagnosis not present

## 2017-01-23 HISTORY — DX: Personal history of transient ischemic attack (TIA), and cerebral infarction without residual deficits: Z86.73

## 2017-01-23 NOTE — Patient Instructions (Addendum)
Stressed the importance of management of continued risk factor monitoring to prevent further stroke/TIA Continue DAPT aspirin and  Brilinta for secondary stroke prevention, stent placement Maintain strict control of hypertension with blood pressure goal below 130/90, today's reading 130/81 continue Coreg and Lisinopril Cholesterol with LDL cholesterol less than 70, continue Crestor Exercise by walking, continue healthy diet with whole grains,  fresh fruits and vegetables Will discharge Stroke Prevention Some medical conditions and behaviors are associated with an increased chance of having a stroke. You may prevent a stroke by making healthy choices and managing medical conditions. How can I reduce my risk of having a stroke?  Stay physically active. Get at least 30 minutes of activity on most or all days.  Do not smoke. It may also be helpful to avoid exposure to secondhand smoke.  Limit alcohol use. Moderate alcohol use is considered to be: ? No more than 2 drinks per day for men. ? No more than 1 drink per day for nonpregnant women.  Eat healthy foods. This involves: ? Eating 5 or more servings of fruits and vegetables a day. ? Making dietary changes that address high blood pressure (hypertension), high cholesterol, diabetes, or obesity.  Manage your cholesterol levels. ? Making food choices that are high in fiber and low in saturated fat, trans fat, and cholesterol may control cholesterol levels. ? Take any prescribed medicines to control cholesterol as directed by your health care provider.  Manage your diabetes. ? Controlling your carbohydrate and sugar intake is recommended to manage diabetes. ? Take any prescribed medicines to control diabetes as directed by your health care provider.  Control your hypertension. ? Making food choices that are low in salt (sodium), saturated fat, trans fat, and cholesterol is recommended to manage hypertension. ? Ask your health care provider  if you need treatment to lower your blood pressure. Take any prescribed medicines to control hypertension as directed by your health care provider. ? If you are 74-72 years of age, have your blood pressure checked every 3-5 years. If you are 65 years of age or older, have your blood pressure checked every year.  Maintain a healthy weight. ? Reducing calorie intake and making food choices that are low in sodium, saturated fat, trans fat, and cholesterol are recommended to manage weight.  Stop drug abuse.  Avoid taking birth control pills. ? Talk to your health care provider about the risks of taking birth control pills if you are over 58 years old, smoke, get migraines, or have ever had a blood clot.  Get evaluated for sleep disorders (sleep apnea). ? Talk to your health care provider about getting a sleep evaluation if you snore a lot or have excessive sleepiness.  Take medicines only as directed by your health care provider. ? For some people, aspirin or blood thinners (anticoagulants) are helpful in reducing the risk of forming abnormal blood clots that can lead to stroke. If you have the irregular heart rhythm of atrial fibrillation, you should be on a blood thinner unless there is a good reason you cannot take them. ? Understand all your medicine instructions.  Make sure that other conditions (such as anemia or atherosclerosis) are addressed. Get help right away if:  You have sudden weakness or numbness of the face, arm, or leg, especially on one side of the body.  Your face or eyelid droops to one side.  You have sudden confusion.  You have trouble speaking (aphasia) or understanding.  You have sudden trouble seeing  in one or both eyes.  You have sudden trouble walking.  You have dizziness.  You have a loss of balance or coordination.  You have a sudden, severe headache with no known cause.  You have new chest pain or an irregular heartbeat. Any of these symptoms may  represent a serious problem that is an emergency. Do not wait to see if the symptoms will go away. Get medical help at once. Call your local emergency services (911 in [place] Do not drive yourself to the hospital. This information is not intended to replace advice given to you by your health care provider. Make sure you discuss any questions you have with your health care provider. Document Released: 06/23/2004 Document Revised: 10/22/2015 Document Reviewed: 11/16/2012 Elsevier Interactive Patient Education  2017 [employer].

## 2017-02-06 ENCOUNTER — Other Ambulatory Visit: Payer: 59 | Admitting: *Deleted

## 2017-02-06 DIAGNOSIS — I5022 Chronic systolic (congestive) heart failure: Secondary | ICD-10-CM

## 2017-02-06 DIAGNOSIS — I2510 Atherosclerotic heart disease of native coronary artery without angina pectoris: Secondary | ICD-10-CM

## 2017-02-06 LAB — COMPREHENSIVE METABOLIC PANEL
A/G RATIO: 1.5 (ref 1.2–2.2)
ALBUMIN: 4.1 g/dL (ref 3.6–4.8)
ALK PHOS: 63 IU/L (ref 39–117)
ALT: 27 IU/L (ref 0–44)
AST: 25 IU/L (ref 0–40)
BILIRUBIN TOTAL: 0.5 mg/dL (ref 0.0–1.2)
BUN / CREAT RATIO: 9 — AB (ref 10–24)
BUN: 9 mg/dL (ref 8–27)
CHLORIDE: 101 mmol/L (ref 96–106)
CO2: 23 mmol/L (ref 20–29)
Calcium: 8.6 mg/dL (ref 8.6–10.2)
Creatinine, Ser: 0.97 mg/dL (ref 0.76–1.27)
GFR calc Af Amer: 96 mL/min/{1.73_m2} (ref >59)
GFR calc non Af Amer: 83 mL/min/{1.73_m2} (ref >59)
GLUCOSE: 95 mg/dL (ref 65–99)
Globulin, Total: 2.7 g/dL (ref 1.5–4.5)
POTASSIUM: 4.1 mmol/L (ref 3.5–5.2)
SODIUM: 138 mmol/L (ref 134–144)
Total Protein: 6.8 g/dL (ref 6.0–8.5)

## 2017-02-06 LAB — LIPID PANEL
Chol/HDL Ratio: 2.9 ratio (ref 0.0–5.0)
Cholesterol, Total: 116 mg/dL (ref 100–199)
HDL: 40 mg/dL (ref >39)
LDL Calculated: 46 mg/dL (ref 0–99)
TRIGLYCERIDES: 149 mg/dL (ref 0–149)
VLDL CHOLESTEROL CAL: 30 mg/dL (ref 5–40)

## 2017-02-07 ENCOUNTER — Telehealth: Payer: Self-pay | Admitting: Nurse Practitioner

## 2017-02-07 NOTE — Telephone Encounter (Signed)
Erroneous encounter, please disregard

## 2017-02-08 ENCOUNTER — Encounter (INDEPENDENT_AMBULATORY_CARE_PROVIDER_SITE_OTHER): Payer: Self-pay

## 2017-02-08 ENCOUNTER — Ambulatory Visit (INDEPENDENT_AMBULATORY_CARE_PROVIDER_SITE_OTHER): Payer: 59 | Admitting: Cardiovascular Disease

## 2017-02-08 ENCOUNTER — Encounter: Payer: Self-pay | Admitting: Cardiovascular Disease

## 2017-02-08 VITALS — BP 124/70 | HR 79 | Ht 70.0 in | Wt 235.0 lb

## 2017-02-08 DIAGNOSIS — I2510 Atherosclerotic heart disease of native coronary artery without angina pectoris: Secondary | ICD-10-CM

## 2017-02-08 DIAGNOSIS — I5042 Chronic combined systolic (congestive) and diastolic (congestive) heart failure: Secondary | ICD-10-CM

## 2017-02-08 MED ORDER — CARVEDILOL 25 MG PO TABS: 25.0000 mg | ORAL_TABLET | Freq: Two times a day (BID) | ORAL | 3 refills | Status: DC

## 2017-02-08 NOTE — Patient Instructions (Signed)
Medication Instructions:  INCREASE Carvedilol to 25 mg twice daily   Labwork: None Ordered   Testing/Procedures: None Ordered   Follow-Up: Your physician recommends that you schedule a follow-up appointment in: 3 months with Dr. Acie Fredrickson.    If you need a refill on your cardiac medications before your next appointment, please call your pharmacy.   Thank you for choosing [facility]! Christen Bame, RN [phone]

## 2017-02-08 NOTE — Progress Notes (Signed)
Cardiology Office Note:    Date:  02/08/2017   ID:  [name], DOB 03/01/1956, MRN [id number]  PCP:  Gaynelle Arabian, MD  Cardiologist:  Mertie Moores, MD    Referring MD: Gaynelle Arabian, MD   Problem list 1. Coronary artery disease-status post DES to the proximal/mid LAD in August, 2017 2. Chronic systolic congestive heart failure 3. Mild aortic insufficiency 4. Mild mitral regurgitation    Chief Complaint  Patient presents with  . Congestive Heart Failure    History of Present Illness:    Dwayne [name] is a 61 y.o. male with a hx of Coronary artery disease and previous stenting. He has ischemic cardiomyopathy with an ejection fraction of 25-30%.   No CP or dyspnea His EF has improved  EF in 40-45%. Feels well   Is growing his beard to play santa this Christmas   Past Medical History:  Diagnosis Date  . Adenomatous colon polyp 2000  . Diverticulosis   . Erectile dysfunction   . Hypercholesteremia   . Hypertension   . Internal hemorrhoids   . Kidney stones   . Splenomegaly     Past Surgical History:  Procedure Laterality Date  . CARDIAC CATHETERIZATION N/A 01/20/2016   Procedure: Left Heart Cath and Coronary Angiography;  Surgeon: Burnell Blanks, MD; LAD 100%, D1 100%, multi-vessel dz 10-30% in other vessels, EF 25-35%, elevated LVEDP  . CARDIAC CATHETERIZATION N/A 01/20/2016   Procedure: Coronary Stent Intervention;  Surgeon: Burnell Blanks, MD; PROMUS PREM MR 3.5X16 mm DES LAD, D1 jailed by the stent but was already occluded  . CHOLECYSTECTOMY  2009  . EYE SURGERY     Age 75  . [place] SURGERY  1993  . REFRACTIVE SURGERY  2008  . SHOULDER SURGERY  1998   Left shoulder fracture-clavicle  . WRIST FRACTURE SURGERY  1995   Bilateral    Current Medications: Current Meds  Medication Sig  . aspirin 81 MG chewable tablet Chew 1 tablet (81 mg total) by mouth daily.  . carvedilol (COREG) 12.5 MG tablet TAKE 1 TABLET BY MOUTH TWICE  A DAY WITH A MEAL  . lisinopril (PRINIVIL,ZESTRIL) 5 MG tablet TAKE 1 TABLET BY MOUTH EVERY DAY  . Multiple Vitamin (MULTIVITAMIN WITH MINERALS) TABS tablet Take 1 tablet by mouth daily.  . nitroGLYCERIN (NITROSTAT) 0.4 MG SL tablet Place 1 tablet (0.4 mg total) under the tongue every 5 (five) minutes as needed for chest pain.  . Omega-3 Fatty Acids (FISH OIL) 1000 MG CAPS Take 1,000 mg by mouth daily.  Marland Kitchen OVER THE COUNTER MEDICATION Apply 1 application topically daily as needed (neck pain). CBD Oil  . rosuvastatin (CRESTOR) 40 MG tablet TAKE 1 TABLET BY MOUTH EVERY DAY AT 6PM  . ticagrelor (BRILINTA) 90 MG TABS tablet Take 1 tablet (90 mg total) by mouth 2 (two) times daily.     Allergies:   Codeine and Lipitor [atorvastatin]   Social History   Social History  . Marital status: Married    Spouse name: N/A  . Number of children: 1  . Years of education: N/A   Occupational History  . Research Tech    Social History Main Topics  . Smoking status: Never Smoker  . Smokeless tobacco: Never Used  . Alcohol use 0.0 oz/week     Comment: 2+ per week  . Drug use: No  . Sexual activity: Not Asked   Other Topics Concern  . None   Social History Narrative  .  None     Family History: The patient's family history includes CVA in his brother and father; Heart disease in his father. ROS:   Please see the history of present illness.     All other systems reviewed and are negative.  EKGs/Labs/Other Studies Reviewed:    The following studies were reviewed today:   EKG:  EKG is not  ordered today.    Recent Labs: 02/06/2017: ALT 27; BUN 9; Creatinine, Ser 0.97; Potassium 4.1; Sodium 138  Recent Lipid Panel    Component Value Date/Time   CHOL 116 02/06/2017 0731   TRIG 149 02/06/2017 0731   HDL 40 02/06/2017 0731   CHOLHDL 2.9 02/06/2017 0731   CHOLHDL 3.6 03/23/2016 1003   VLDL 27 03/23/2016 1003   LDLCALC 46 02/06/2017 0731    Physical Exam:    VS:  BP 124/70   Pulse 79    Ht 5\' 10"  (1.778 m)   Wt 235 lb (106.6 kg)   BMI 33.72 kg/m     Wt Readings from Last 3 Encounters:  02/08/17 235 lb (106.6 kg)  01/23/17 233 lb (105.7 kg)  07/26/16 227 lb (103 kg)     GEN:  Well nourished, well developed in no acute distress HEENT: Normal.  Long white beard  NECK: No JVD; No carotid bruits LYMPHATICS: No lymphadenopathy CARDIAC: RR, no murmurs, rubs, gallops RESPIRATORY:  Clear to auscultation without rales, wheezing or rhonchi  ABDOMEN: Soft, non-tender, non-distended MUSCULOSKELETAL:  No edema; No deformity  SKIN: Warm and dry NEUROLOGIC:  Alert and oriented x 3 PSYCHIATRIC:  Normal affect   ASSESSMENT:    No diagnosis found. PLAN:    In order of problems listed above:  1.  CAD - no angina  Continue aspirin and Brilinta. He is not having any episodes of angina.  2.   Chronic combined systolic and diastolic CHF:  His ejection fraction has improved significantly.   I considered increasing the lisinopril, or carvedilol, or adding Aldactone. We'll increase the carvedilol to 25 mg twice a day. I'll see him again in 3 months for follow-up visit.   Medication Adjustments/Labs and Tests Ordered: Current medicines are reviewed at length with the patient today.  Concerns regarding medicines are outlined above.  No orders of the defined types were placed in this encounter.  No orders of the defined types were placed in this encounter.   Signed, Mertie Moores, MD  02/08/2017 2:50 PM    [facility]

## 2017-02-09 ENCOUNTER — Other Ambulatory Visit: Payer: Self-pay | Admitting: Cardiovascular Disease

## 2017-03-03 DIAGNOSIS — M546 Pain in thoracic spine: Secondary | ICD-10-CM | POA: Diagnosis not present

## 2017-03-03 DIAGNOSIS — M542 Cervicalgia: Secondary | ICD-10-CM | POA: Diagnosis not present

## 2017-03-03 DIAGNOSIS — M50321 Other cervical disc degeneration at C4-C5 level: Secondary | ICD-10-CM | POA: Diagnosis not present

## 2017-03-03 DIAGNOSIS — M545 Low back pain: Secondary | ICD-10-CM | POA: Diagnosis not present

## 2017-03-03 DIAGNOSIS — M50221 Other cervical disc displacement at C4-C5 level: Secondary | ICD-10-CM | POA: Diagnosis not present

## 2017-03-09 DIAGNOSIS — M791 Myalgia, unspecified site: Secondary | ICD-10-CM | POA: Diagnosis not present

## 2017-03-09 DIAGNOSIS — M50221 Other cervical disc displacement at C4-C5 level: Secondary | ICD-10-CM | POA: Diagnosis not present

## 2017-03-09 DIAGNOSIS — M546 Pain in thoracic spine: Secondary | ICD-10-CM | POA: Diagnosis not present

## 2017-03-09 DIAGNOSIS — M542 Cervicalgia: Secondary | ICD-10-CM | POA: Diagnosis not present

## 2017-03-09 DIAGNOSIS — M50321 Other cervical disc degeneration at C4-C5 level: Secondary | ICD-10-CM | POA: Diagnosis not present

## 2017-03-13 DIAGNOSIS — M542 Cervicalgia: Secondary | ICD-10-CM | POA: Diagnosis not present

## 2017-03-13 DIAGNOSIS — M791 Myalgia, unspecified site: Secondary | ICD-10-CM | POA: Diagnosis not present

## 2017-03-15 DIAGNOSIS — M791 Myalgia, unspecified site: Secondary | ICD-10-CM | POA: Diagnosis not present

## 2017-03-15 DIAGNOSIS — M546 Pain in thoracic spine: Secondary | ICD-10-CM | POA: Diagnosis not present

## 2017-03-15 DIAGNOSIS — M542 Cervicalgia: Secondary | ICD-10-CM | POA: Diagnosis not present

## 2017-03-20 DIAGNOSIS — M546 Pain in thoracic spine: Secondary | ICD-10-CM | POA: Diagnosis not present

## 2017-03-20 DIAGNOSIS — M791 Myalgia, unspecified site: Secondary | ICD-10-CM | POA: Diagnosis not present

## 2017-03-20 DIAGNOSIS — M542 Cervicalgia: Secondary | ICD-10-CM | POA: Diagnosis not present

## 2017-03-22 DIAGNOSIS — M546 Pain in thoracic spine: Secondary | ICD-10-CM | POA: Diagnosis not present

## 2017-03-22 DIAGNOSIS — M542 Cervicalgia: Secondary | ICD-10-CM | POA: Diagnosis not present

## 2017-03-22 DIAGNOSIS — M791 Myalgia, unspecified site: Secondary | ICD-10-CM | POA: Diagnosis not present

## 2017-03-27 DIAGNOSIS — M546 Pain in thoracic spine: Secondary | ICD-10-CM | POA: Diagnosis not present

## 2017-03-27 DIAGNOSIS — M791 Myalgia, unspecified site: Secondary | ICD-10-CM | POA: Diagnosis not present

## 2017-03-27 DIAGNOSIS — M542 Cervicalgia: Secondary | ICD-10-CM | POA: Diagnosis not present

## 2017-03-29 DIAGNOSIS — M546 Pain in thoracic spine: Secondary | ICD-10-CM | POA: Diagnosis not present

## 2017-03-29 DIAGNOSIS — M542 Cervicalgia: Secondary | ICD-10-CM | POA: Diagnosis not present

## 2017-03-29 DIAGNOSIS — M791 Myalgia, unspecified site: Secondary | ICD-10-CM | POA: Diagnosis not present

## 2017-04-03 DIAGNOSIS — M791 Myalgia, unspecified site: Secondary | ICD-10-CM | POA: Diagnosis not present

## 2017-04-03 DIAGNOSIS — M546 Pain in thoracic spine: Secondary | ICD-10-CM | POA: Diagnosis not present

## 2017-04-03 DIAGNOSIS — M542 Cervicalgia: Secondary | ICD-10-CM | POA: Diagnosis not present

## 2017-04-04 ENCOUNTER — Other Ambulatory Visit: Payer: Self-pay | Admitting: Cardiovascular Disease

## 2017-04-07 DIAGNOSIS — M545 Low back pain: Secondary | ICD-10-CM | POA: Diagnosis not present

## 2017-04-07 DIAGNOSIS — M791 Myalgia, unspecified site: Secondary | ICD-10-CM | POA: Diagnosis not present

## 2017-04-07 DIAGNOSIS — M546 Pain in thoracic spine: Secondary | ICD-10-CM | POA: Diagnosis not present

## 2017-04-10 DIAGNOSIS — M546 Pain in thoracic spine: Secondary | ICD-10-CM | POA: Diagnosis not present

## 2017-04-10 DIAGNOSIS — M545 Low back pain: Secondary | ICD-10-CM | POA: Diagnosis not present

## 2017-04-10 DIAGNOSIS — M791 Myalgia, unspecified site: Secondary | ICD-10-CM | POA: Diagnosis not present

## 2017-04-12 DIAGNOSIS — M791 Myalgia, unspecified site: Secondary | ICD-10-CM | POA: Diagnosis not present

## 2017-04-12 DIAGNOSIS — M545 Low back pain: Secondary | ICD-10-CM | POA: Diagnosis not present

## 2017-04-12 DIAGNOSIS — M546 Pain in thoracic spine: Secondary | ICD-10-CM | POA: Diagnosis not present

## 2017-04-17 DIAGNOSIS — M546 Pain in thoracic spine: Secondary | ICD-10-CM | POA: Diagnosis not present

## 2017-04-17 DIAGNOSIS — M50221 Other cervical disc displacement at C4-C5 level: Secondary | ICD-10-CM | POA: Diagnosis not present

## 2017-04-17 DIAGNOSIS — M545 Low back pain: Secondary | ICD-10-CM | POA: Diagnosis not present

## 2017-04-17 DIAGNOSIS — M791 Myalgia, unspecified site: Secondary | ICD-10-CM | POA: Diagnosis not present

## 2017-04-17 DIAGNOSIS — M50321 Other cervical disc degeneration at C4-C5 level: Secondary | ICD-10-CM | POA: Diagnosis not present

## 2017-04-19 DIAGNOSIS — M50223 Other cervical disc displacement at C6-C7 level: Secondary | ICD-10-CM | POA: Diagnosis not present

## 2017-04-19 DIAGNOSIS — M545 Low back pain: Secondary | ICD-10-CM | POA: Diagnosis not present

## 2017-04-19 DIAGNOSIS — M791 Myalgia, unspecified site: Secondary | ICD-10-CM | POA: Diagnosis not present

## 2017-04-24 DIAGNOSIS — M50223 Other cervical disc displacement at C6-C7 level: Secondary | ICD-10-CM | POA: Diagnosis not present

## 2017-04-24 DIAGNOSIS — M545 Low back pain: Secondary | ICD-10-CM | POA: Diagnosis not present

## 2017-04-24 DIAGNOSIS — M791 Myalgia, unspecified site: Secondary | ICD-10-CM | POA: Diagnosis not present

## 2017-04-26 DIAGNOSIS — M50223 Other cervical disc displacement at C6-C7 level: Secondary | ICD-10-CM | POA: Diagnosis not present

## 2017-04-26 DIAGNOSIS — M545 Low back pain: Secondary | ICD-10-CM | POA: Diagnosis not present

## 2017-04-26 DIAGNOSIS — M791 Myalgia, unspecified site: Secondary | ICD-10-CM | POA: Diagnosis not present

## 2017-05-01 DIAGNOSIS — M50223 Other cervical disc displacement at C6-C7 level: Secondary | ICD-10-CM | POA: Diagnosis not present

## 2017-05-01 DIAGNOSIS — M791 Myalgia, unspecified site: Secondary | ICD-10-CM | POA: Diagnosis not present

## 2017-05-01 DIAGNOSIS — M545 Low back pain: Secondary | ICD-10-CM | POA: Diagnosis not present

## 2017-05-03 DIAGNOSIS — M5127 Other intervertebral disc displacement, lumbosacral region: Secondary | ICD-10-CM | POA: Diagnosis not present

## 2017-05-03 DIAGNOSIS — M5137 Other intervertebral disc degeneration, lumbosacral region: Secondary | ICD-10-CM | POA: Diagnosis not present

## 2017-05-03 DIAGNOSIS — M50223 Other cervical disc displacement at C6-C7 level: Secondary | ICD-10-CM | POA: Diagnosis not present

## 2017-05-05 DIAGNOSIS — M50223 Other cervical disc displacement at C6-C7 level: Secondary | ICD-10-CM | POA: Diagnosis not present

## 2017-05-05 DIAGNOSIS — M5127 Other intervertebral disc displacement, lumbosacral region: Secondary | ICD-10-CM | POA: Diagnosis not present

## 2017-05-05 DIAGNOSIS — M5137 Other intervertebral disc degeneration, lumbosacral region: Secondary | ICD-10-CM | POA: Diagnosis not present

## 2017-05-10 ENCOUNTER — Ambulatory Visit: Payer: 59 | Admitting: Cardiovascular Disease

## 2017-05-10 DIAGNOSIS — M5137 Other intervertebral disc degeneration, lumbosacral region: Secondary | ICD-10-CM | POA: Diagnosis not present

## 2017-05-10 DIAGNOSIS — M50223 Other cervical disc displacement at C6-C7 level: Secondary | ICD-10-CM | POA: Diagnosis not present

## 2017-05-10 DIAGNOSIS — M5127 Other intervertebral disc displacement, lumbosacral region: Secondary | ICD-10-CM | POA: Diagnosis not present

## 2017-05-12 ENCOUNTER — Encounter: Payer: Self-pay | Admitting: Cardiovascular Disease

## 2017-05-12 DIAGNOSIS — M5137 Other intervertebral disc degeneration, lumbosacral region: Secondary | ICD-10-CM | POA: Diagnosis not present

## 2017-05-12 DIAGNOSIS — M50223 Other cervical disc displacement at C6-C7 level: Secondary | ICD-10-CM | POA: Diagnosis not present

## 2017-05-12 DIAGNOSIS — M5127 Other intervertebral disc displacement, lumbosacral region: Secondary | ICD-10-CM | POA: Diagnosis not present

## 2017-05-15 DIAGNOSIS — M50223 Other cervical disc displacement at C6-C7 level: Secondary | ICD-10-CM | POA: Diagnosis not present

## 2017-05-15 DIAGNOSIS — M5137 Other intervertebral disc degeneration, lumbosacral region: Secondary | ICD-10-CM | POA: Diagnosis not present

## 2017-05-15 DIAGNOSIS — M5127 Other intervertebral disc displacement, lumbosacral region: Secondary | ICD-10-CM | POA: Diagnosis not present

## 2017-05-17 ENCOUNTER — Encounter: Payer: Self-pay | Admitting: Cardiovascular Disease

## 2017-05-17 ENCOUNTER — Encounter (INDEPENDENT_AMBULATORY_CARE_PROVIDER_SITE_OTHER): Payer: Self-pay

## 2017-05-17 ENCOUNTER — Ambulatory Visit: Payer: 59 | Admitting: Cardiovascular Disease

## 2017-05-17 VITALS — BP 118/76 | HR 85 | Ht 70.0 in | Wt 238.8 lb

## 2017-05-17 DIAGNOSIS — I5032 Chronic diastolic (congestive) heart failure: Secondary | ICD-10-CM | POA: Diagnosis not present

## 2017-05-17 DIAGNOSIS — M50223 Other cervical disc displacement at C6-C7 level: Secondary | ICD-10-CM | POA: Diagnosis not present

## 2017-05-17 DIAGNOSIS — I2510 Atherosclerotic heart disease of native coronary artery without angina pectoris: Secondary | ICD-10-CM

## 2017-05-17 DIAGNOSIS — E785 Hyperlipidemia, unspecified: Secondary | ICD-10-CM

## 2017-05-17 DIAGNOSIS — M5137 Other intervertebral disc degeneration, lumbosacral region: Secondary | ICD-10-CM | POA: Diagnosis not present

## 2017-05-17 DIAGNOSIS — M5127 Other intervertebral disc displacement, lumbosacral region: Secondary | ICD-10-CM | POA: Diagnosis not present

## 2017-05-17 MED ORDER — TICAGRELOR 60 MG PO TABS: 60.0000 mg | ORAL_TABLET | Freq: Two times a day (BID) | ORAL | 3 refills | Status: DC

## 2017-05-17 NOTE — Patient Instructions (Signed)
Medication Instructions:  1. DECREASE BRILINTA TO 60 MG TWICE DAILY  Labwork: 6 MONTHS FOR FASTING LIPID AND LIVER PANEL AND BMET  Testing/Procedures: NONE ORDERED TODAY  Follow-Up: Your physician wants you to follow-up in: 6 MONTHS WITH DR. Acie Fredrickson  You will receive a reminder letter in the mail two months in advance. If you don't receive a letter, please call our office to schedule the follow-up appointment.   Any Other Special Instructions Will Be Listed Below (If Applicable).     If you need a refill on your cardiac medications before your next appointment, please call your pharmacy.

## 2017-05-17 NOTE — Progress Notes (Signed)
Cardiology Office Note:    Date:  05/17/2017   ID:  Dwayne [name], DOB 02-12-1956, MRN [id number]  PCP:  Gaynelle Arabian, MD  Cardiologist:  Mertie Moores, MD    Referring MD: Gaynelle Arabian, MD   Problem list 1. Coronary artery disease-status post DES to the proximal/mid LAD in August, 2017 2. Chronic systolic congestive heart failure 3. Mild aortic insufficiency 4. Mild mitral regurgitation    Chief Complaint  Patient presents with  . Congestive Heart Failure    History of Present Illness:    Dwayne [name] is a 61 y.o. male with a hx of Coronary artery disease and previous stenting. He has ischemic cardiomyopathy with an ejection fraction of 25-30%.   No CP or dyspnea His EF has improved  EF in 40-45%. 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  He also has a history of hyperlipidemia. His left ventricular systolic function has improved to 40-45%. His breathing is good.  He is not having any episodes of angina.  He seems to be tolerating his medications quite well.   Past Medical History:  Diagnosis Date  . Adenomatous colon polyp 2000  . Diverticulosis   . Erectile dysfunction   . Hypercholesteremia   . Hypertension   . Internal hemorrhoids   . Kidney stones   . Splenomegaly     Past Surgical History:  Procedure Laterality Date  . CARDIAC CATHETERIZATION N/A 01/20/2016   Procedure: Left Heart Cath and Coronary Angiography;  Surgeon: Burnell Blanks, MD; LAD 100%, D1 100%, multi-vessel dz 10-30% in other vessels, EF 25-35%, elevated LVEDP  . CARDIAC CATHETERIZATION N/A 01/20/2016   Procedure: Coronary Stent Intervention;  Surgeon: Burnell Blanks, MD; PROMUS PREM MR 3.5X16 mm DES LAD, D1 jailed by the stent but was already occluded  . CHOLECYSTECTOMY  2009  . EYE SURGERY       Age 15  . [place] SURGERY  1993  . REFRACTIVE SURGERY  2008  . SHOULDER SURGERY  1998   Left shoulder fracture-clavicle  . WRIST FRACTURE SURGERY  1995   Bilateral    Current Medications: No outpatient medications have been marked as taking for the 05/17/17 encounter (Office Visit) with Kenyona Rena, Wonda Cheng, MD.     Allergies:   Codeine and Lipitor [atorvastatin]   Social History   Socioeconomic History  . Marital status: Married    Spouse name: None  . Number of children: 1  . Years of education: None  . Highest education level: None  Social Needs  . Financial resource strain: None  . Food insecurity - worry: None  . Food insecurity - inability: None  . Transportation needs - medical: None  . Transportation needs - non-medical: None  Occupational History  . Occupation: Proofreader  Tobacco Use  . Smoking status: Never Smoker  . Smokeless tobacco: Never Used  Substance and Sexual Activity  . Alcohol use: Yes    Alcohol/week: 0.0 oz    Comment: 2+ per week  . Drug use: No  . Sexual activity: None  Other Topics Concern  . None  Social History Narrative  . None     Family History: The patient's family history includes CVA in his brother and father; Heart disease in his father. ROS:   Please see the history of present  illness.     All other systems reviewed and are negative.    Recent Labs: 02/06/2017: ALT 27; BUN 9; Creatinine, Ser 0.97; Potassium 4.1; Sodium 138  Recent Lipid Panel    Component Value Date/Time   CHOL 116 02/06/2017 0731   TRIG 149 02/06/2017 0731   HDL 40 02/06/2017 0731   CHOLHDL 2.9 02/06/2017 0731   CHOLHDL 3.6 03/23/2016 1003   VLDL 27 03/23/2016 1003   LDLCALC 46 02/06/2017 0731    Physical Exam: Blood pressure 118/76, pulse 85, height 5\' 10"  (1.778 m), weight 238 lb 12.8 oz (108.3 kg), SpO2 96 %.  GEN:  Well nourished, well developed in no acute distress HEENT: Normal NECK: No JVD; No carotid bruits LYMPHATICS: No  lymphadenopathy CARDIAC: RR, no murmurs, rubs, gallops RESPIRATORY:  Clear to auscultation without rales, wheezing or rhonchi  ABDOMEN: Soft, non-tender, non-distended MUSCULOSKELETAL:  No edema; No deformity  SKIN: Warm and dry NEUROLOGIC:  Alert and oriented x 3  EKG:  EKG is not  ordered today.    ASSESSMENT:    No diagnosis found. PLAN:    In order of problems listed above:  1.  CAD - no angina  It has been more than a year since his last stent placement.  We will decrease the Brilinta to 60 mg twice a day.  Continue aspirin 81 mg a day.  2.   Chronic combined systolic and diastolic CHF:  .  Continue current dose of carvedilol.  We have tried higher doses of lisinopril in the past but he became hypotensive.  Continue Lisinopril  5 mg a day.   Medication Adjustments/Labs and Tests Ordered: Current medicines are reviewed at length with the patient today.  Concerns regarding medicines are outlined above.  No orders of the defined types were placed in this encounter.  No orders of the defined types were placed in this encounter.   Signed, Mertie Moores, MD  05/17/2017 2:21 PM    [place]

## 2017-05-19 DIAGNOSIS — M5127 Other intervertebral disc displacement, lumbosacral region: Secondary | ICD-10-CM | POA: Diagnosis not present

## 2017-05-19 DIAGNOSIS — M50223 Other cervical disc displacement at C6-C7 level: Secondary | ICD-10-CM | POA: Diagnosis not present

## 2017-05-19 DIAGNOSIS — M5137 Other intervertebral disc degeneration, lumbosacral region: Secondary | ICD-10-CM | POA: Diagnosis not present

## 2017-06-08 DIAGNOSIS — M50223 Other cervical disc displacement at C6-C7 level: Secondary | ICD-10-CM | POA: Diagnosis not present

## 2017-06-08 DIAGNOSIS — M5137 Other intervertebral disc degeneration, lumbosacral region: Secondary | ICD-10-CM | POA: Diagnosis not present

## 2017-06-08 DIAGNOSIS — M5127 Other intervertebral disc displacement, lumbosacral region: Secondary | ICD-10-CM | POA: Diagnosis not present

## 2017-07-21 DIAGNOSIS — R7309 Other abnormal glucose: Secondary | ICD-10-CM | POA: Diagnosis not present

## 2017-07-21 DIAGNOSIS — Z0000 Encounter for general adult medical examination without abnormal findings: Secondary | ICD-10-CM | POA: Diagnosis not present

## 2017-07-21 DIAGNOSIS — E7800 Pure hypercholesterolemia, unspecified: Secondary | ICD-10-CM | POA: Diagnosis not present

## 2017-07-21 DIAGNOSIS — Z125 Encounter for screening for malignant neoplasm of prostate: Secondary | ICD-10-CM | POA: Diagnosis not present

## 2017-11-18 IMAGING — MR MR CERVICAL SPINE W/O CM
4 of 5 series · 21 of 48 positions shown · non-contrast
Comparison: Cervical spine radiographs 12/07/2015

CLINICAL DATA: Neck pain, right shoulder pain and numbness and
tingling in right arm.

EXAM:
MRI CERVICAL SPINE WITHOUT CONTRAST
TECHNIQUE: Multiplanar, multisequence MR imaging of the cervical spine was
performed. No intravenous contrast was administered.

[Series 2: T2 · sagittal · 3.0mm · 0.41mm/px · 6 of 12 slices shown (1 of 3)]
[im 1/12]
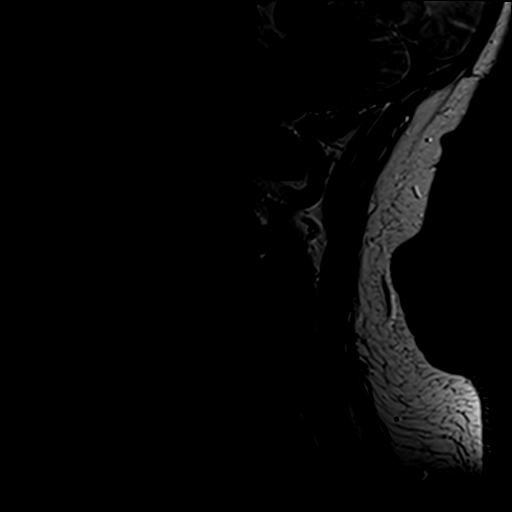
[im 3/12]
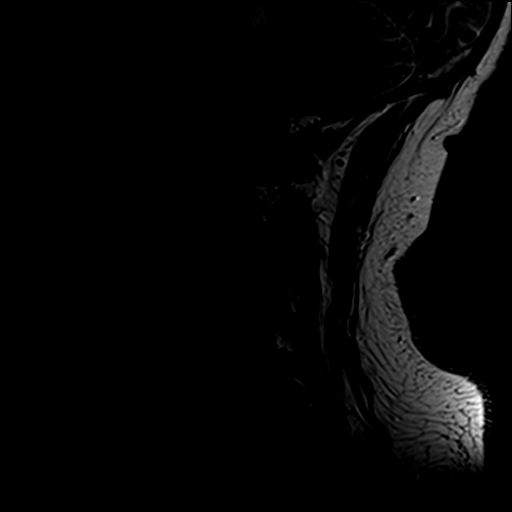
[im 5/12]
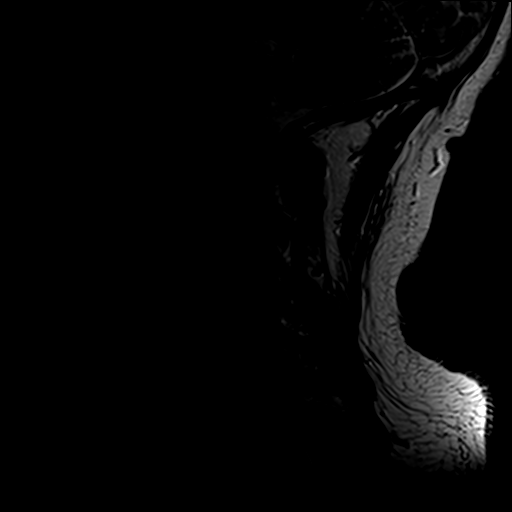
[im 7/12]
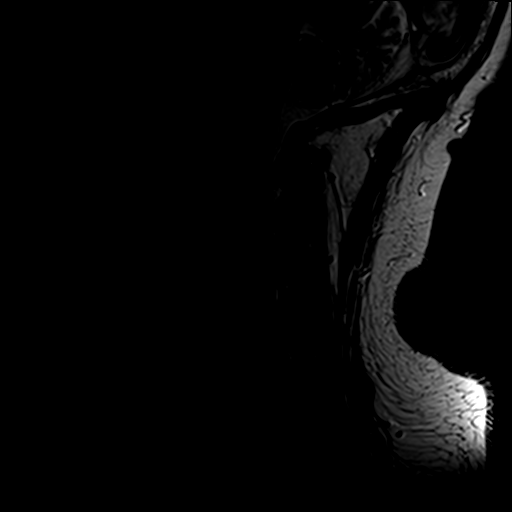
[im 9/12]
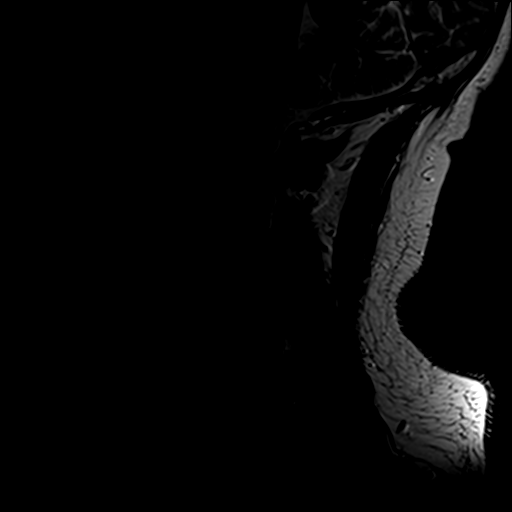
[im 12/12]
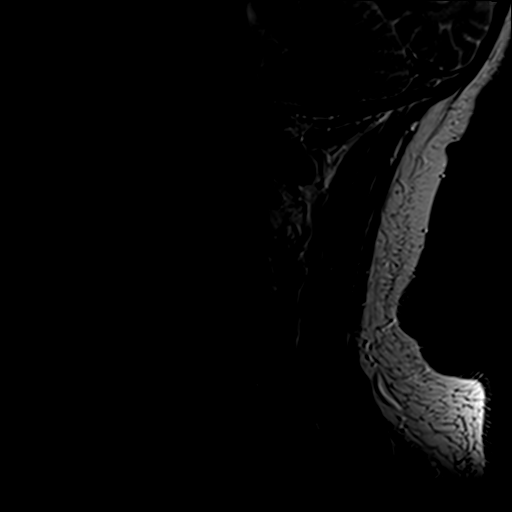

[Series 3: T1 · sagittal · 3.0mm · 0.41mm/px · 3 of 12 slices shown]
[im 3/12]
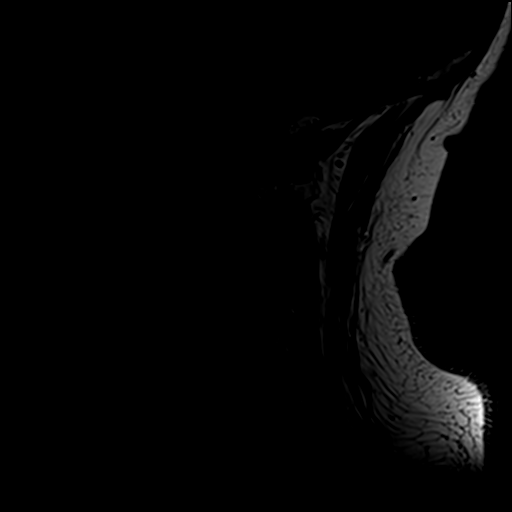
[im 7/12]
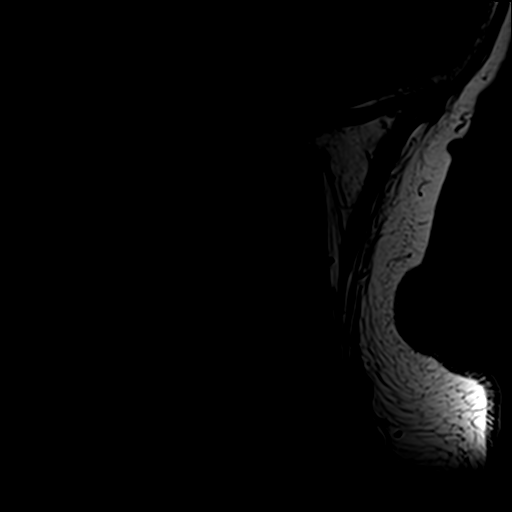
[im 12/12]
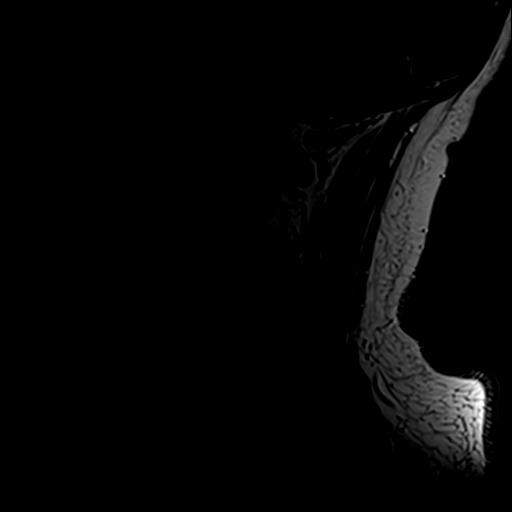

[Series 5: T2 · axial · 3.0mm · 0.39mm/px · z∈[-48,+48]mm · 9 of 28 slices shown (2 of 3)]
[im 1/28]
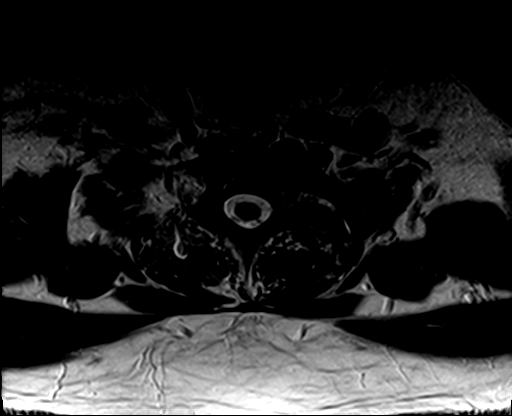
[im 4/28]
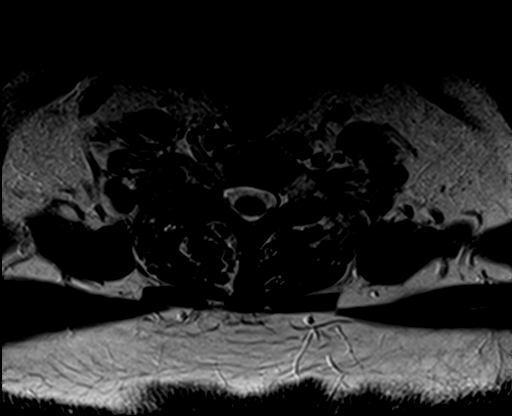
[im 8/28]
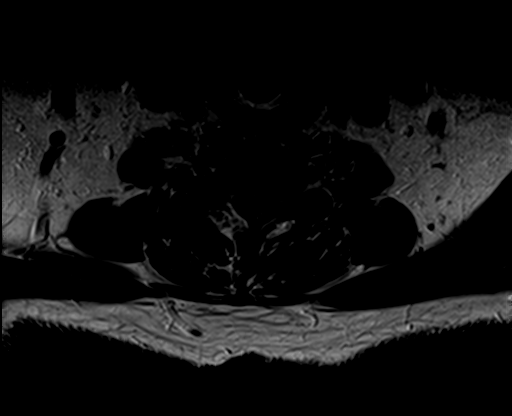
[im 12/28]
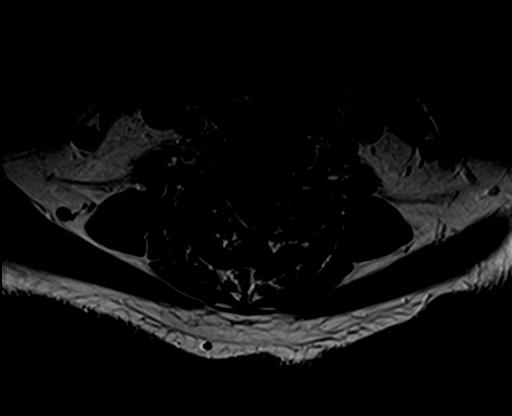
[im 14/28]
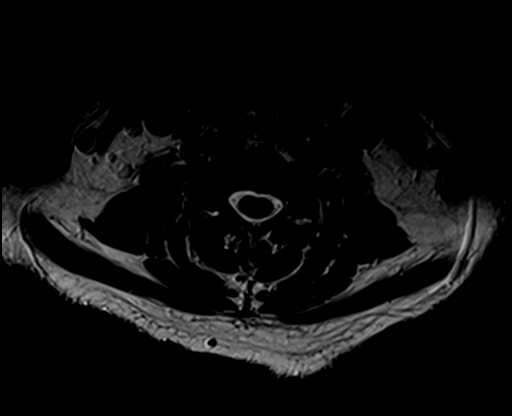
[im 16/28]
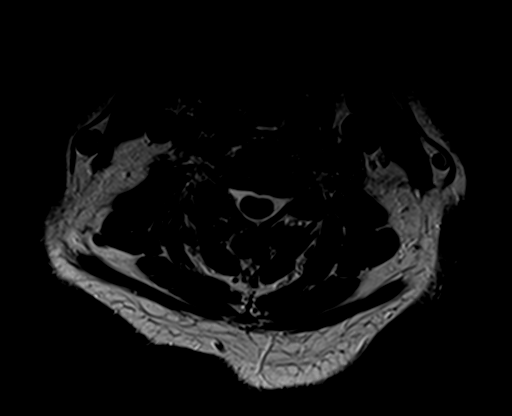
[im 20/28]
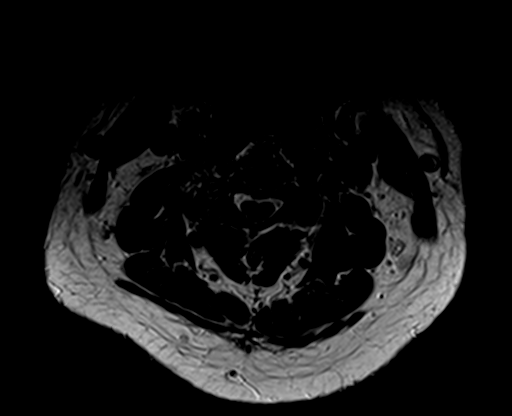
[im 24/28]
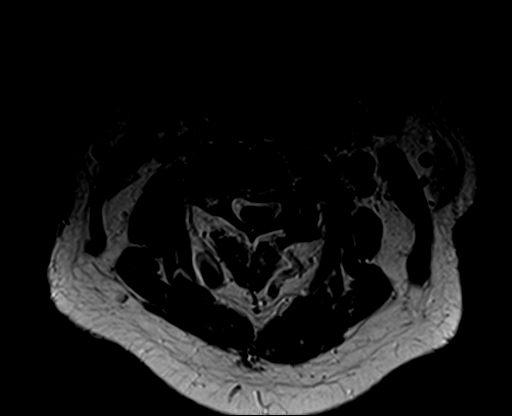
[im 28/28]
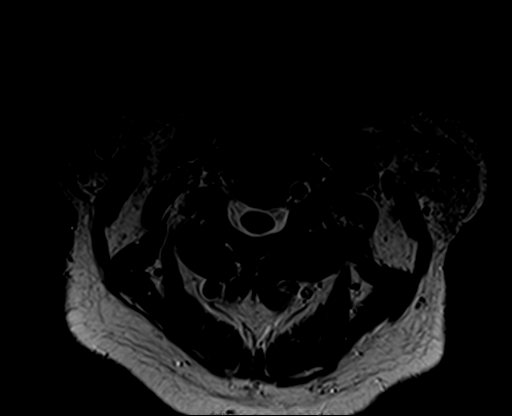

[Series 6: T2 · axial · 3.0mm · 0.39mm/px · z∈[-37,+34]mm · 3 of 28 slices shown (3 of 3)]
[im 4/28]
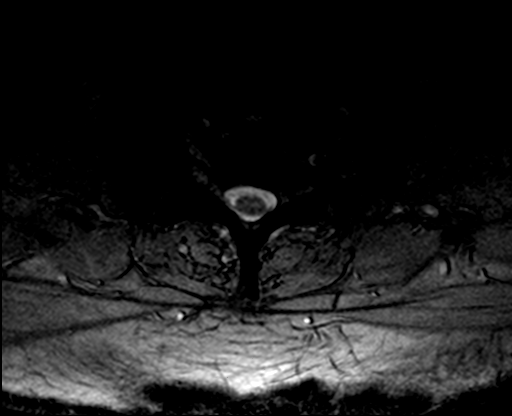
[im 14/28]
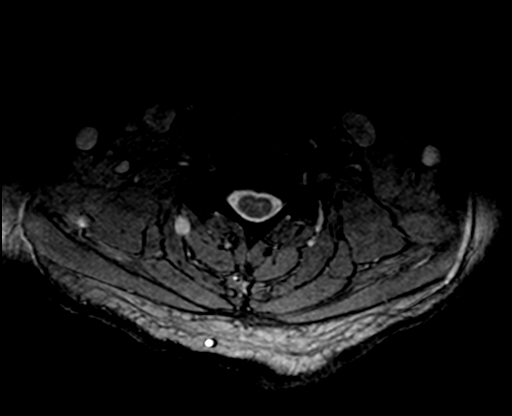
[im 24/28]
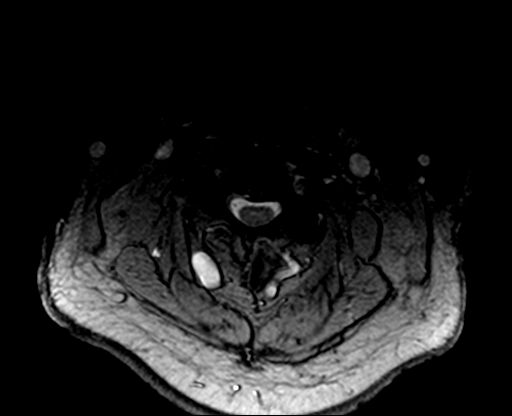

[21 of 48 positions shown; findings below may reference images not displayed]

FINDINGS: Alignment: Degenerative cervical spondylosis with multilevel disc
disease and facet disease but overall normal alignment.

Vertebrae: Endplate reactive changes but no fracture or worrisome
bone lesions.

Cord: No cord lesions or syrinx.

Posterior Fossa, vertebral arteries, paraspinal tissues: No
significant findings.

Disc levels:

C1-2: Mild degenerative changes with pannus formation but no
significant mass effect on the upper cervical cord.

C2-3:  No significant findings.

C3-4: Broad-based disc protrusion, osteophytic ridging and uncinate
spurring with mass effect on the ventral thecal sac and narrowing of
the ventral CSF space. There is also mild bilateral foraminal
stenosis.

C4-5: Mild annular bulge, osteophytic ridging and uncinate spurring.
There is asymmetric right-sided facet disease contributing to
moderate right foraminal stenosis. No spinal stenosis. Mild left
foraminal narrowing.

C5-6: Broad-based disc protrusion, osteophytic ridging, uncinate
spurring and facet disease contributing to mild spinal stenosis and
moderate bilateral foraminal stenosis.

C6-7: Diffuse bulging degenerated annulus, osteophytic ridging,
uncinate spurring and facet disease contributing to mild spinal
stenosis and moderate bilateral foraminal stenosis.

C7-T1:  No significant findings.
IMPRESSION: 1. Degenerative cervical spondylosis with multilevel disc disease
and facet disease.
2. Spinal and bilateral foraminal stenosis at C5-6 and C6-7.
3. Mild bilateral foraminal stenosis at C3-4 and moderate right
foraminal stenosis at C4-5.

## 2017-12-18 DIAGNOSIS — M722 Plantar fascial fibromatosis: Secondary | ICD-10-CM | POA: Diagnosis not present

## 2017-12-18 DIAGNOSIS — M7732 Calcaneal spur, left foot: Secondary | ICD-10-CM | POA: Diagnosis not present

## 2017-12-18 DIAGNOSIS — M71572 Other bursitis, not elsewhere classified, left ankle and foot: Secondary | ICD-10-CM | POA: Diagnosis not present

## 2017-12-25 DIAGNOSIS — M722 Plantar fascial fibromatosis: Secondary | ICD-10-CM | POA: Diagnosis not present

## 2018-01-01 DIAGNOSIS — M71572 Other bursitis, not elsewhere classified, left ankle and foot: Secondary | ICD-10-CM | POA: Diagnosis not present

## 2018-01-01 DIAGNOSIS — M722 Plantar fascial fibromatosis: Secondary | ICD-10-CM | POA: Diagnosis not present

## 2018-01-26 ENCOUNTER — Other Ambulatory Visit: Payer: Self-pay | Admitting: Cardiovascular Disease

## 2018-02-18 ENCOUNTER — Other Ambulatory Visit: Payer: Self-pay | Admitting: Cardiovascular Disease

## 2018-02-20 DIAGNOSIS — M722 Plantar fascial fibromatosis: Secondary | ICD-10-CM | POA: Diagnosis not present

## 2018-02-27 DIAGNOSIS — M722 Plantar fascial fibromatosis: Secondary | ICD-10-CM | POA: Diagnosis not present

## 2018-04-04 ENCOUNTER — Ambulatory Visit: Payer: 59 | Admitting: Cardiovascular Disease

## 2018-04-04 ENCOUNTER — Encounter: Payer: Self-pay | Admitting: Cardiovascular Disease

## 2018-04-04 ENCOUNTER — Encounter (INDEPENDENT_AMBULATORY_CARE_PROVIDER_SITE_OTHER): Payer: Self-pay

## 2018-04-04 VITALS — BP 130/70 | HR 67 | Ht 70.0 in | Wt 229.4 lb

## 2018-04-04 DIAGNOSIS — I2510 Atherosclerotic heart disease of native coronary artery without angina pectoris: Secondary | ICD-10-CM

## 2018-04-04 DIAGNOSIS — I5022 Chronic systolic (congestive) heart failure: Secondary | ICD-10-CM

## 2018-04-04 LAB — LIPID PANEL
CHOLESTEROL TOTAL: 115 mg/dL (ref 100–199)
Chol/HDL Ratio: 2.8 ratio (ref 0.0–5.0)
HDL: 41 mg/dL (ref >39)
LDL Calculated: 53 mg/dL (ref 0–99)
Triglycerides: 103 mg/dL (ref 0–149)
VLDL Cholesterol Cal: 21 mg/dL (ref 5–40)

## 2018-04-04 LAB — HEPATIC FUNCTION PANEL
ALBUMIN: 4.3 g/dL (ref 3.6–4.8)
ALK PHOS: 70 IU/L (ref 39–117)
ALT: 27 IU/L (ref 0–44)
AST: 23 IU/L (ref 0–40)
BILIRUBIN TOTAL: 0.5 mg/dL (ref 0.0–1.2)
BILIRUBIN, DIRECT: 0.16 mg/dL (ref 0.00–0.40)
TOTAL PROTEIN: 6.7 g/dL (ref 6.0–8.5)

## 2018-04-04 LAB — BASIC METABOLIC PANEL
BUN/Creatinine Ratio: 9 — ABNORMAL LOW (ref 10–24)
BUN: 8 mg/dL (ref 8–27)
CALCIUM: 9.1 mg/dL (ref 8.6–10.2)
CO2: 24 mmol/L (ref 20–29)
Chloride: 101 mmol/L (ref 96–106)
Creatinine, Ser: 0.87 mg/dL (ref 0.76–1.27)
GFR calc Af Amer: 105 mL/min/{1.73_m2} (ref >59)
GFR, EST NON AFRICAN AMERICAN: 91 mL/min/{1.73_m2} (ref >59)
GLUCOSE: 106 mg/dL — AB (ref 65–99)
Potassium: 3.8 mmol/L (ref 3.5–5.2)
Sodium: 140 mmol/L (ref 134–144)

## 2018-04-04 NOTE — Progress Notes (Signed)
Cardiology Office Note:    Date:  04/04/2018   ID:  Dwayne [name], DOB 1955/08/05, MRN [id number]  PCP:  Gaynelle Arabian, MD  Cardiologist:  Mertie Moores, MD    Referring MD: Gaynelle Arabian, MD   Problem list 1. Coronary artery disease-status post DES to the proximal/mid LAD in August, 2017 2. Chronic systolic congestive heart failure 3. Mild aortic insufficiency 4. Mild mitral regurgitation    Chief Complaint  Patient presents with  . Coronary Artery Disease  . Congestive Heart Failure    History of Present Illness:    Dwayne [name] is a 62 y.o. male with a hx of Coronary artery disease and previous stenting. He has ischemic cardiomyopathy with an ejection fraction of 25-30%.   No CP or dyspnea His EF has improved  EF in 40-45%. 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  He also has a history of hyperlipidemia. His left ventricular systolic function has improved to 40-45%. His breathing is good.  He is not having any episodes of angina.  He seems to be tolerating his medications quite well.  April 04, 2018: Dwayne [name] is seen today for follow up of his CAD and chronic systolic CHF.  Has had a cold . Has not felt as well .   Had an episode of dizziness while at the beach.   Called EMS.  Was checked out and did not go to ER .   Growing his beard again to be [place] this Christmas       Past Medical History:  Diagnosis Date  . Adenomatous colon polyp 2000  . Diverticulosis   . Erectile dysfunction   . Hypercholesteremia   . Hypertension   . Internal hemorrhoids   . Kidney stones   . Splenomegaly     Past Surgical History:  Procedure Laterality Date  . CARDIAC CATHETERIZATION N/A 01/20/2016   Procedure: Left Heart Cath and Coronary Angiography;  Surgeon: Burnell Blanks, MD; LAD 100%,  D1 100%, multi-vessel dz 10-30% in other vessels, EF 25-35%, elevated LVEDP  . CARDIAC CATHETERIZATION N/A 01/20/2016   Procedure: Coronary Stent Intervention;  Surgeon: Burnell Blanks, MD; PROMUS PREM MR 3.5X16 mm DES LAD, D1 jailed by the stent but was already occluded  . CHOLECYSTECTOMY  2009  . EYE SURGERY     Age 6  . [place] SURGERY  1993  . REFRACTIVE SURGERY  2008  . SHOULDER SURGERY  1998   Left shoulder fracture-clavicle  . WRIST FRACTURE SURGERY  1995   Bilateral    Current Medications: Current Meds  Medication Sig  . aspirin 81 MG chewable tablet Chew 1 tablet (81 mg total) by mouth daily.  . carvedilol (COREG) 25 MG tablet TAKE 1 TABLET BY MOUTH TWICE A DAY  . lisinopril (PRINIVIL,ZESTRIL) 5 MG tablet Take 5 mg by mouth daily.  . Multiple Vitamin (MULTIVITAMIN WITH MINERALS) TABS tablet Take 1 tablet by mouth daily.  . nitroGLYCERIN (NITROSTAT) 0.4 MG SL tablet Place 1 tablet (0.4 mg total) under the tongue every 5 (five) minutes as needed for chest pain.  . Omega-3 Fatty Acids (FISH OIL) 1000 MG CAPS Take 1,000 mg by mouth daily.  Marland Kitchen OVER THE COUNTER MEDICATION Apply 1 application topically daily as needed (neck pain). CBD Oil  . ticagrelor (BRILINTA) 60 MG TABS  tablet Take 1 tablet (60 mg total) by mouth 2 (two) times daily.     Allergies:   Codeine and Lipitor [atorvastatin]   Social History   Socioeconomic History  . Marital status: Married    Spouse name: Not on file  . Number of children: 1  . Years of education: Not on file  . Highest education level: Not on file  Occupational History  . Occupation: Geneticist, molecular  . Financial resource strain: Not on file  . Food insecurity:    Worry: Not on file    Inability: Not on file  . Transportation needs:    Medical: Not on file    Non-medical: Not on file  Tobacco Use  . Smoking status: Never Smoker  . Smokeless tobacco: Never Used  Substance and Sexual Activity  . Alcohol use: Yes     Alcohol/week: 0.0 standard drinks    Comment: 2+ per week  . Drug use: No  . Sexual activity: Not on file  Lifestyle  . Physical activity:    Days per week: Not on file    Minutes per session: Not on file  . Stress: Not on file  Relationships  . Social connections:    Talks on phone: Not on file    Gets together: Not on file    Attends religious service: Not on file    Active member of club or organization: Not on file    Attends meetings of clubs or organizations: Not on file    Relationship status: Not on file  Other Topics Concern  . Not on file  Social History Narrative  . Not on file     Family History: The patient's family history includes CVA in his brother and father; Heart disease in his father. ROS:   Please see the history of present illness.     All other systems reviewed and are negative.    Recent Labs: 04/04/2018: ALT 27; BUN 8; Creatinine, Ser 0.87; Potassium 3.8; Sodium 140  Recent Lipid Panel    Component Value Date/Time   CHOL 115 04/04/2018 0838   TRIG 103 04/04/2018 0838   HDL 41 04/04/2018 0838   CHOLHDL 2.8 04/04/2018 0838   CHOLHDL 3.6 03/23/2016 1003   VLDL 27 03/23/2016 1003   LDLCALC 53 04/04/2018 0838   Physical Exam: Blood pressure 130/70, pulse 67, height 5\' 10"  (1.778 m), weight 229 lb 6.4 oz (104.1 kg), SpO2 95 %.  GEN:  Well nourished, well developed in no acute distress HEENT: Normal NECK: No JVD; No carotid bruits LYMPHATICS: No lymphadenopathy CARDIAC: RRR  RESPIRATORY:  Clear to auscultation without rales, wheezing or rhonchi  ABDOMEN: Soft, non-tender, non-distended MUSCULOSKELETAL:  No edema; No deformity  SKIN: Warm and dry NEUROLOGIC:  Alert and oriented x 3   EKG:   April 04, 2018: Normal sinus rhythm at rate of 67.  First-degree AV block.  Otherwise normal EKG..    ASSESSMENT:    1. Coronary artery disease involving native coronary artery of native heart without angina pectoris   2. Chronic systolic heart  failure (HCC)    PLAN:    In order of problems listed above:  1.  CAD -he is doing well.  Is not had any episodes of chest pain.    2.   Chronic combined systolic and diastolic CHF:  He is not having any signs or symptoms of heart failure.  .  Lightheadedness: The patient had an episode of lightheadedness this past  weekend.  He had taken his medications but had not eaten.  At the same time he had a bit of a cold.  I suspect that he had an episode of hypotension.  This resolved spontaneously and has not recurred.  He will continue to watch for this   Medication Adjustments/Labs and Tests Ordered: Current medicines are reviewed at length with the patient today.  Concerns regarding medicines are outlined above.  Orders Placed This Encounter  Procedures  . Lipid Profile  . Basic Metabolic Panel (BMET)  . Hepatic function panel  . EKG 12-Lead   No orders of the defined types were placed in this encounter.    Signed, Mertie Moores, MD  04/04/2018 5:46 PM    [facility]

## 2018-04-04 NOTE — Patient Instructions (Signed)
Medication Instructions:  Your physician recommends that you continue on your current medications as directed. Please refer to the Current Medication list given to you today.  If you need a refill on your cardiac medications before your next appointment, please call your pharmacy.    Lab work: TODAY - cholesterol, liver panel, basic metabolic panel  If you have labs (blood work) drawn today and your tests are completely normal, you will receive your results only by: . MyChart Message (if you have MyChart) OR . A paper copy in the mail If you have any lab test that is abnormal or we need to change your treatment, we will call you to review the results.   Testing/Procedures: None Ordered   Follow-Up: At [facility], you and your health needs are our priority.  As part of our continuing mission to provide you with exceptional heart care, we have created designated Provider Care Teams.  These Care Teams include your primary Cardiologist (physician) and Advanced Practice Providers (APPs -  Physician Assistants and Nurse Practitioners) who all work together to provide you with the care you need, when you need it. You will need a follow up appointment in:  1 years.  Please call our office 2 months in advance to schedule this appointment.  You may see Dr. Nahser or one of the following Advanced Practice Providers on your designated Care Team: Scott Weaver, PA-C Vin Bhagat, PA-C . Janine Hammond, NP    

## 2018-04-23 ENCOUNTER — Other Ambulatory Visit: Payer: Self-pay | Admitting: Cardiovascular Disease

## 2018-05-03 ENCOUNTER — Other Ambulatory Visit: Payer: Self-pay | Admitting: Cardiovascular Disease

## 2018-05-03 MED ORDER — TICAGRELOR 60 MG PO TABS: 60.0000 mg | ORAL_TABLET | Freq: Two times a day (BID) | ORAL | 3 refills | Status: DC

## 2018-05-14 ENCOUNTER — Other Ambulatory Visit: Payer: Self-pay | Admitting: Cardiovascular Disease

## 2018-06-28 ENCOUNTER — Other Ambulatory Visit: Payer: Self-pay | Admitting: Cardiovascular Disease

## 2018-07-31 ENCOUNTER — Telehealth: Payer: Self-pay | Admitting: Cardiovascular Disease

## 2018-07-31 DIAGNOSIS — Z0000 Encounter for general adult medical examination without abnormal findings: Secondary | ICD-10-CM | POA: Diagnosis not present

## 2018-07-31 NOTE — Telephone Encounter (Signed)
Informed nurse at Dr. Andrew Au office, Patient's PCP. That according to our records patient's crestor was removed from his medication list because he was not taking the medication. Informed her that at office visit or lab work results there was no reference of patient discontinuing Crestor due to lab results. Jasmine stated she would let Dr. Marisue Humble know.

## 2018-07-31 NOTE — Telephone Encounter (Signed)
Jasmine from Dr. Andrew Au office called back. Dr. Marisue Humble would like to know if Dr. Acie Fredrickson thinks patient should be on crestor. When patient had last lipid panel, patient has taking crestor at that time. Will forward to Dr. Acie Fredrickson and his nurse.

## 2018-07-31 NOTE — Telephone Encounter (Signed)
Jasmine called wanting to check if Dr. Acie Fredrickson had advised the patient to stop taking rosuvastatin (CRESTOR) 40 MG tablet.

## 2018-08-01 NOTE — Telephone Encounter (Signed)
Spoke with [facility] and she is aware of Dr Lanny Hurst recommendations and will call pt ./cy

## 2018-08-01 NOTE — Telephone Encounter (Signed)
I did not stop crestor. Please restart if patient is willing to take

## 2018-11-13 ENCOUNTER — Encounter: Payer: Self-pay | Admitting: Internal Medicine

## 2018-12-18 ENCOUNTER — Telehealth: Payer: Self-pay

## 2018-12-18 NOTE — Telephone Encounter (Signed)
Covid-19 screening questions for 7-22 appt  Do you now or have you had a fever in the last 14 days?  Do you have any respiratory symptoms of shortness of breath or cough now or in the last 14 days?  Do you have any family members or close contacts with diagnosed or suspected Covid-19 in the past 14 days?  Have you been tested for Covid-19 and found to be positive?  Pt answered NO to all questions.

## 2018-12-19 ENCOUNTER — Encounter (INDEPENDENT_AMBULATORY_CARE_PROVIDER_SITE_OTHER): Payer: Self-pay

## 2018-12-19 ENCOUNTER — Ambulatory Visit: Payer: 59 | Admitting: Gastroenterology

## 2018-12-19 ENCOUNTER — Encounter: Payer: Self-pay | Admitting: Gastroenterology

## 2018-12-19 VITALS — BP 130/84 | HR 84 | Temp 98.0°F | Ht 70.0 in | Wt 228.0 lb

## 2018-12-19 DIAGNOSIS — Z7902 Long term (current) use of antithrombotics/antiplatelets: Secondary | ICD-10-CM | POA: Diagnosis not present

## 2018-12-19 DIAGNOSIS — Z1211 Encounter for screening for malignant neoplasm of colon: Secondary | ICD-10-CM | POA: Diagnosis not present

## 2018-12-19 NOTE — Patient Instructions (Signed)
If you are age 65 or older, your body mass index should be between 23-30. Your Body mass index is 32.71 kg/m. If this is out of the aforementioned range listed, please consider follow up with your Primary Care Provider.  If you are age 17 or younger, your body mass index should be between 19-25. Your Body mass index is 32.71 kg/m. If this is out of the aformentioned range listed, please consider follow up with your Primary Care Provider.   To help prevent the possible spread of infection to our patients, communities, and staff; we will be implementing the following measures:  As of now we are not allowing any visitors/family members to accompany you to any upcoming appointments with [facility]. If you have any concerns about this please contact our office to discuss prior to the appointment.   It has been recommended that you have a colonoscopy completed. However, Dr. Vena Rua schedule for next month is not available at this time.  We will call you to schedule your procedure once it opens. Please contact our office at [phone] should you have any questions in the meantime.   Once we have you scheduled, we will request clearance from Dr. Acie Fredrickson for you to hold Brilinta for 5 days prior to your procedure. We will call you when we hear back from him and let you know his decision.  If you do not hear from our office 1 week prior to your scheduled procedure, please call [phone] to discuss.   Thank you for entrusting me with your care and for choosing [employer], Alonza Bogus, [place]

## 2018-12-19 NOTE — Progress Notes (Addendum)
12/19/2018 Dwayne [name] [id number] 27-Feb-1956   HISTORY OF PRESENT ILLNESS: This is a pleasant 63 year old male who is a patient of Dr. Vena Rua.  He is here today to schedule colonoscopy.  Last colonoscopy was in July 2010 by Dr. Lajoyce Corners.  At that time he had 1 small polyp removed, but pathology showed polypoid colonic mucosa with benign lymphoid aggregate.  He is on Brilinta as prescribed Dr. Acie Fredrickson.  He is on this medication for drug-eluting stent that was placed in August 2017.  Everything has been stable from a cardiac standpoint.  He does not have any GI complaints at this time including rectal bleeding, etc.   Past Medical History:  Diagnosis Date  . Adenomatous colon polyp 2000  . Diverticulosis   . Erectile dysfunction   . Hypercholesteremia   . Hypertension   . Internal hemorrhoids   . Kidney stones   . Splenomegaly    Past Surgical History:  Procedure Laterality Date  . CARDIAC CATHETERIZATION N/A 01/20/2016   Procedure: Left Heart Cath and Coronary Angiography;  Surgeon: Burnell Blanks, MD; LAD 100%, D1 100%, multi-vessel dz 10-30% in other vessels, EF 25-35%, elevated LVEDP  . CARDIAC CATHETERIZATION N/A 01/20/2016   Procedure: Coronary Stent Intervention;  Surgeon: Burnell Blanks, MD; PROMUS PREM MR 3.5X16 mm DES LAD, D1 jailed by the stent but was already occluded  . CHOLECYSTECTOMY  2009  . EYE SURGERY     Age 41  . [place] SURGERY  1993  . REFRACTIVE SURGERY  2008  . SHOULDER SURGERY  1998   Left shoulder fracture-clavicle  . WRIST FRACTURE SURGERY  1995   Bilateral    reports that he has never smoked. He has never used smokeless tobacco. He reports current alcohol use. He reports that he does not use drugs. family history includes CVA in his brother, brother, brother, and father; Heart disease in his father. Allergies  Allergen Reactions  . Codeine Nausea Only  . Lipitor [Atorvastatin] Other (See Comments)    Myalgia       Outpatient Encounter Medications as of 12/19/2018  Medication Sig  . AMBULATORY NON FORMULARY MEDICATION Medication Name: CBD Oil - daily  . aspirin 81 MG chewable tablet Chew 1 tablet (81 mg total) by mouth daily.  . carvedilol (COREG) 25 MG tablet TAKE 1 TABLET BY MOUTH TWICE A DAY  . lisinopril (PRINIVIL,ZESTRIL) 5 MG tablet Take 1 tablet (5 mg total) by mouth daily.  . Multiple Vitamin (MULTIVITAMIN WITH MINERALS) TABS tablet Take 1 tablet by mouth daily.  . rosuvastatin (CRESTOR) 40 MG tablet Take 40 mg by mouth daily.  . ticagrelor (BRILINTA) 60 MG TABS tablet Take 1 tablet (60 mg total) by mouth 2 (two) times daily.  . nitroGLYCERIN (NITROSTAT) 0.4 MG SL tablet Place 1 tablet (0.4 mg total) under the tongue every 5 (five) minutes as needed for chest pain. (Patient not taking: Reported on 12/19/2018)  . [DISCONTINUED] Omega-3 Fatty Acids (FISH OIL) 1000 MG CAPS Take 1,000 mg by mouth daily.  . [DISCONTINUED] OVER THE COUNTER MEDICATION Apply 1 application topically daily as needed (neck pain). CBD Oil   No facility-administered encounter medications on file as of 12/19/2018.      REVIEW OF SYSTEMS  : All other systems reviewed and negative except where noted in the History of Present Illness.   PHYSICAL EXAM: BP 130/84   Pulse 84 Comment: irregular  Temp 98 F (36.7 C)   Ht 5\' 10"  (1.778 m)  Wt 228 lb (103.4 kg)   BMI 32.71 kg/m  General: Well developed white male in no acute distress Head: Normocephalic and atraumatic Eyes:  Sclerae anicteric, conjunctiva pink. Ears: Normal auditory acuity Lungs: Clear throughout to auscultation; no increased WOB. Heart: Regular rate and rhythm; no M/R/G. Abdomen: Soft, non-distended.  BS present.  Non-tender. Rectal:  Will be done at the time of colonoscopy. Musculoskeletal: Symmetrical with no gross deformities  Skin: No lesions on visible extremities Extremities: No edema  Neurological: Alert oriented x 4, grossly non-focal  Psychological:  Alert and cooperative. Normal mood and affect  ASSESSMENT AND PLAN: *Screening colonoscopy:  Will schedule with Dr. Hilarie Fredrickson.  Last was in 2010. *Chronic antiplatelet use with Brilinta due to DES in 2017:  [place] for 5 days before procedure - will instruct when and how to resume after procedure. Risks and benefits of procedure including bleeding, perforation, infection, missed lesions, medication reactions and possible hospitalization or surgery if complications occur explained. Additional rare but real risk of cardiovascular event such as heart attack or ischemia/infarct of other organs off of Brilinta explained and need to seek urgent help if this occurs. Will communicate by phone or EMR with patient's prescribing provider, Dr. Acie Fredrickson, to confirm that holding Brilinta is reasonable in this case.    CC:  Gaynelle Arabian, MD  Addendum: Reviewed and agree with assessment and management plan. Pyrtle, Lajuan Lines, MD

## 2018-12-28 ENCOUNTER — Telehealth: Payer: Self-pay

## 2018-12-28 DIAGNOSIS — Z1211 Encounter for screening for malignant neoplasm of colon: Secondary | ICD-10-CM

## 2018-12-28 DIAGNOSIS — Z7902 Long term (current) use of antithrombotics/antiplatelets: Secondary | ICD-10-CM

## 2018-12-28 MED ORDER — SUPREP BOWEL PREP KIT 17.5-3.13-1.6 GM/177ML PO SOLN: ORAL | 0 refills | Status: DC

## 2018-12-28 NOTE — Telephone Encounter (Signed)
OK to hold brilinta for 5 days prior to colonoscopy

## 2018-12-28 NOTE — Telephone Encounter (Signed)
  Pt contacted in regards to clearance for colonoscopy. He is doing well without any cardiac symptoms. No angina. Ok to proceed with colonoscopy. Per Dr. Acie Fredrickson, ok to hold Brilinta 5 days prior to colonoscopy.

## 2018-12-28 NOTE — Telephone Encounter (Signed)
error 

## 2018-12-28 NOTE — Telephone Encounter (Signed)
  Chart reviewed. 63 y/o male s/p anterior STEMI in 2017 2/2 occluded prox LAD, treated w/ PCI + DES, ICM EF 30-35% at time of MI but improved on f/u echo 03/2016 to 40-45%, needing clearance to undergo colonoscopy and hold Brilinta x 5 days. I will route to primary MD for clearance to hold Brilinta and will contact pt to screen for symptoms.

## 2018-12-28 NOTE — Telephone Encounter (Signed)
Called and spoke to pt.  He understands to hold his Brilinta starting on 8-26 for procedure on 8-31.  He expressed understanding and had already heard from Dr. Elmarie Shiley office. Pt has previsit on Monday, 8-17.

## 2018-12-28 NOTE — Telephone Encounter (Signed)
Previsit scheduled.  Letter sent to pt.

## 2018-12-28 NOTE — Telephone Encounter (Signed)
Called and spoke to pt.  Scheduled him for colon in the [place] with Dr. Hilarie Fredrickson.  No Previsits available.  Will call pt to come in to be instructed in mid August (week of August 17th). amb ref sent and Suprep sent to pharmacy.

## 2018-12-28 NOTE — Telephone Encounter (Signed)
-----   Message from Roetta Sessions, [place] sent at 12/19/2018 10:26 AM EDT ----- Regarding: schedule colon with Dr. Hilarie Fredrickson (on Brilinta) In office VISIT on 7-22 with J. Zehr but Pyrtle had no colon dates at that time. When schedule comes out, call and schedule. He is on Brilinta and need to get clearance from Nahser to hold for 5 days.[facility]). Will come back to be instructed.

## 2018-12-28 NOTE — Telephone Encounter (Signed)
.  [facility] Pre-operative Risk Assessment     Request for surgical clearance:     Endoscopy Procedure  What type of surgery is being performed?     colonoscopy  When is this surgery scheduled?     01-28-19  What type of clearance is required ?   Pharmacy  Are there any medications that need to be held prior to surgery and how long? Brilinta 5 days  Practice name and name of physician performing surgery?  [place] Cellar, MD   [facility]  What is your office phone and fax number?      Phone- [phone]  Fax- [phone]  Lemar Lofty, CMA  Anesthesia type (None, local, MAC, general) ?       MAC  Thank you.

## 2019-01-14 ENCOUNTER — Ambulatory Visit (AMBULATORY_SURGERY_CENTER): Payer: Self-pay | Admitting: *Deleted

## 2019-01-14 ENCOUNTER — Other Ambulatory Visit: Payer: Self-pay

## 2019-01-14 VITALS — Temp 96.9°F | Ht 70.0 in | Wt 229.0 lb

## 2019-01-14 DIAGNOSIS — Z1211 Encounter for screening for malignant neoplasm of colon: Secondary | ICD-10-CM

## 2019-01-14 NOTE — Progress Notes (Signed)
No egg or soy allergy known to patient  No issues with past sedation with any surgeries  or procedures, no intubation problems  No diet pills per patient No home 02 use per patient  Brillinta is patient's blood thinner, cleared to stop x 5 days prior to procedure Pt denies issues with constipation  No A fib or A flutter  EMMI  Offered but declined

## 2019-01-22 ENCOUNTER — Encounter: Payer: Self-pay | Admitting: Internal Medicine

## 2019-01-25 ENCOUNTER — Telehealth: Payer: Self-pay | Admitting: Internal Medicine

## 2019-01-25 NOTE — Telephone Encounter (Signed)
Spoke with patient regarding Covid-19 screening questions  Do you now or have you had a fever in the last 14 days?        Do you have any respiratory symptoms of shortness of breath or cough now or in the last 14 days?       Do you have any family members or close contacts with diagnosed or suspected Covid-19 in the past 14 days?        Have you been tested for Covid-19 and found to be positive?

## 2019-01-28 ENCOUNTER — Encounter: Payer: Self-pay | Admitting: Internal Medicine

## 2019-01-28 ENCOUNTER — Other Ambulatory Visit: Payer: Self-pay

## 2019-01-28 ENCOUNTER — Ambulatory Visit (AMBULATORY_SURGERY_CENTER): Payer: 59 | Admitting: Internal Medicine

## 2019-01-28 VITALS — BP 128/74 | HR 75 | Temp 98.4°F | Resp 14 | Ht 70.0 in | Wt 228.0 lb

## 2019-01-28 DIAGNOSIS — D122 Benign neoplasm of ascending colon: Secondary | ICD-10-CM

## 2019-01-28 DIAGNOSIS — Z86010 Personal history of colonic polyps: Secondary | ICD-10-CM | POA: Diagnosis present

## 2019-01-28 DIAGNOSIS — Z1211 Encounter for screening for malignant neoplasm of colon: Secondary | ICD-10-CM

## 2019-01-28 MED ORDER — SODIUM CHLORIDE 0.9 % IV SOLN: 500.0000 mL | Freq: Once | INTRAVENOUS | Status: DC

## 2019-01-28 NOTE — Progress Notes (Signed)
Called to room to assist during endoscopic procedure.  Patient ID and intended procedure confirmed with present staff. Received instructions for my participation in the procedure from the performing physician.  

## 2019-01-28 NOTE — Progress Notes (Signed)
Report to PACU, RN, vss, BBS= Clear.  

## 2019-01-28 NOTE — Progress Notes (Signed)
Pt. Reports no change in his medical or surgical history since his pre-visit 01/14/2019.

## 2019-01-28 NOTE — Progress Notes (Signed)
No problems noted in the recovery room. maw 

## 2019-01-28 NOTE — Patient Instructions (Signed)
YOU HAD AN ENDOSCOPIC PROCEDURE TODAY AT [facility]:   Refer to the procedure report that was given to you for any specific questions about what was found during the examination.  If the procedure report does not answer your questions, please call your gastroenterologist to clarify.  If you requested that your care partner not be given the details of your procedure findings, then the procedure report has been included in a sealed envelope for you to review at your convenience later.  YOU SHOULD EXPECT: Some feelings of bloating in the abdomen. Passage of more gas than usual.  Walking can help get rid of the air that was put into your GI tract during the procedure and reduce the bloating. If you had a lower endoscopy (such as a colonoscopy or flexible sigmoidoscopy) you may notice spotting of blood in your stool or on the toilet paper. If you underwent a bowel prep for your procedure, you may not have a normal bowel movement for a few days.  Please Note:  You might notice some irritation and congestion in your nose or some drainage.  This is from the oxygen used during your procedure.  There is no need for concern and it should clear up in a day or so.  SYMPTOMS TO REPORT IMMEDIATELY:   Following lower endoscopy (colonoscopy or flexible sigmoidoscopy):  Excessive amounts of blood in the stool  Significant tenderness or worsening of abdominal pains  Swelling of the abdomen that is new, acute  Fever of 100F or higher    For urgent or emergent issues, a gastroenterologist can be reached at any hour by calling [phone].   DIET:  We do recommend a small meal at first, but then you may proceed to your regular diet.  Drink plenty of fluids but you should avoid alcoholic beverages for 24 hours.  ACTIVITY:  You should plan to take it easy for the rest of today and you should NOT DRIVE or use heavy machinery until tomorrow (because of the sedation medicines used during the test).     FOLLOW UP: Our staff will call the number listed on your records 48-72 hours following your procedure to check on you and address any questions or concerns that you may have regarding the information given to you following your procedure. If we do not reach you, we will leave a message.  We will attempt to reach you two times.  During this call, we will ask if you have developed any symptoms of COVID 19. If you develop any symptoms (ie: fever, flu-like symptoms, shortness of breath, cough etc.) before then, please call [phone].  If you test positive for Covid 19 in the 2 weeks post procedure, please call and report this information to [place].    If any biopsies were taken you will be contacted by phone or by letter within the next 1-3 weeks.  Please call us at [phone] if you have not heard about the biopsies in 3 weeks.    SIGNATURES/CONFIDENTIALITY: You and/or your care partner have signed paperwork which will be entered into your electronic medical record.  These signatures attest to the fact that that the information above on your After Visit Summary has been reviewed and is understood.  Full responsibility of the confidentiality of this discharge information lies with you and/or your care-partner.    Handouts were given to your care partner on polyps, diverticulosis, and hemorrhoids. Per Dr. Hilarie Fredrickson resume BRILINTA at prior dose tomorrow.  Refer  to managing physician for further adjustment of therapy. You may resume your other current medications today. Await biopsy results. Please call if any questions or concerns.

## 2019-01-28 NOTE — Op Note (Signed)
[facility] Patient Name: Dwayne [name] Procedure Date: 01/28/2019 10:18 AM MRN: [id number] Endoscopist: Jerene Bears , MD Age: 63 Referring MD:  Date of Birth: [birth year] Gender: Male Account #: [id number] Procedure:                Colonoscopy Indications:              High risk colon cancer surveillance: Personal                            history of non-advanced adenoma (2000), Last                            colonoscopy 10 years ago Medicines:                Monitored Anesthesia Care Procedure:                Pre-Anesthesia Assessment:                           - Prior to the procedure, a History and Physical                            was performed, and patient medications and                            allergies were reviewed. The patient's tolerance of                            previous anesthesia was also reviewed. The risks                            and benefits of the procedure and the sedation                            options and risks were discussed with the patient.                            All questions were answered, and informed consent                            was obtained. Prior Anticoagulants: The patient has                            taken Brilinta (ticagrelor), last dose was 5 days                            prior to procedure. ASA Grade Assessment: III - A                            patient with severe systemic disease. After                            reviewing the risks and benefits, the patient was  deemed in satisfactory condition to undergo the                            procedure.                           After obtaining informed consent, the colonoscope                            was passed under direct vision. Throughout the                            procedure, the patient's blood pressure, pulse, and                            oxygen saturations were monitored continuously. The   Colonoscope was introduced through the anus and                            advanced to the terminal ileum. The colonoscopy was                            performed without difficulty. The patient tolerated                            the procedure well. The quality of the bowel                            preparation was good. The ileocecal valve,                            appendiceal orifice, and rectum were photographed. Scope In: 10:26:34 AM Scope Out: 10:40:45 AM Scope Withdrawal Time: 0 hours 10 minutes 47 seconds  Total Procedure Duration: 0 hours 14 minutes 11 seconds  Findings:                 The digital rectal exam was normal.                           The terminal ileum appeared normal.                           Two sessile polyps were found in the ascending                            colon. The polyps were 4 to 7 mm in size. These                            polyps were removed with a cold snare. Resection                            and retrieval were complete.                           Multiple small and large-mouthed diverticula were  found in the sigmoid colon.                           Internal hemorrhoids were found during                            retroflexion. The hemorrhoids were small. Complications:            No immediate complications. Estimated Blood Loss:     Estimated blood loss was minimal. Impression:               - The examined portion of the ileum was normal.                           - Two 4 to 7 mm polyps in the ascending colon,                            removed with a cold snare. Resected and retrieved.                           - Diverticulosis in the sigmoid colon.                           - Small internal hemorrhoids. Recommendation:           - Patient has a contact number available for                            emergencies. The signs and symptoms of potential                            delayed complications were  discussed with the                            patient. Return to normal activities tomorrow.                            Written discharge instructions were provided to the                            patient.                           - Resume previous diet.                           - Continue present medications.                           - Await pathology results.                           - Repeat colonoscopy is recommended for                            surveillance. The colonoscopy date will be  determined after pathology results from today's                            exam become available for review.                           - Resume Brilinta (ticagrelor) at prior dose                            tomorrow. Refer to managing physician for further                            adjustment of therapy. Jerene Bears, MD 01/28/2019 10:46:17 AM This report has been signed electronically.

## 2019-01-30 ENCOUNTER — Telehealth: Payer: Self-pay

## 2019-01-30 NOTE — Telephone Encounter (Signed)
No answer, left message to call back later today, B.Argentina Kosch RN. 

## 2019-01-30 NOTE — Telephone Encounter (Signed)
No answer, left message to call if having any issues or concerns, B.Dasani Thurlow RN 

## 2019-02-07 ENCOUNTER — Encounter: Payer: Self-pay | Admitting: Internal Medicine

## 2019-03-02 DIAGNOSIS — Z23 Encounter for immunization: Secondary | ICD-10-CM | POA: Diagnosis not present

## 2019-05-14 ENCOUNTER — Other Ambulatory Visit: Payer: Self-pay | Admitting: Cardiovascular Disease

## 2019-05-17 DIAGNOSIS — Z20828 Contact with and (suspected) exposure to other viral communicable diseases: Secondary | ICD-10-CM | POA: Diagnosis not present

## 2019-05-21 ENCOUNTER — Other Ambulatory Visit: Payer: Self-pay | Admitting: Cardiovascular Disease

## 2019-05-26 DIAGNOSIS — Z20828 Contact with and (suspected) exposure to other viral communicable diseases: Secondary | ICD-10-CM | POA: Diagnosis not present

## 2019-05-28 ENCOUNTER — Other Ambulatory Visit: Payer: Self-pay | Admitting: Cardiovascular Disease

## 2019-05-29 ENCOUNTER — Encounter: Payer: Self-pay | Admitting: *Deleted

## 2019-05-29 DIAGNOSIS — N529 Male erectile dysfunction, unspecified: Secondary | ICD-10-CM | POA: Insufficient documentation

## 2019-05-29 DIAGNOSIS — K648 Other hemorrhoids: Secondary | ICD-10-CM | POA: Insufficient documentation

## 2019-06-07 ENCOUNTER — Ambulatory Visit: Payer: PPO | Admitting: Cardiovascular Disease

## 2019-06-07 ENCOUNTER — Encounter: Payer: Self-pay | Admitting: Cardiovascular Disease

## 2019-06-07 ENCOUNTER — Other Ambulatory Visit: Payer: Self-pay

## 2019-06-07 VITALS — BP 132/82 | HR 76 | Ht 70.0 in | Wt 234.0 lb

## 2019-06-07 DIAGNOSIS — I5042 Chronic combined systolic (congestive) and diastolic (congestive) heart failure: Secondary | ICD-10-CM | POA: Diagnosis not present

## 2019-06-07 DIAGNOSIS — I2510 Atherosclerotic heart disease of native coronary artery without angina pectoris: Secondary | ICD-10-CM | POA: Diagnosis not present

## 2019-06-07 DIAGNOSIS — I5022 Chronic systolic (congestive) heart failure: Secondary | ICD-10-CM

## 2019-06-07 DIAGNOSIS — I2102 ST elevation (STEMI) myocardial infarction involving left anterior descending coronary artery: Secondary | ICD-10-CM

## 2019-06-07 NOTE — Progress Notes (Signed)
Cardiology Office Note:    Date:  06/07/2019   ID:  Dwayne [name], DOB [birth year], MRN [id number]  PCP:  Gaynelle Arabian, MD  Cardiologist:  Mertie Moores, MD    Referring MD: Gaynelle Arabian, MD   Problem list 1. Coronary artery disease-status post DES to the proximal/mid LAD in August, 2017 2. Chronic systolic congestive heart failure 3. Mild aortic insufficiency 4. Mild mitral regurgitation    No chief complaint on file.   History of Present Illness:    Dwayne [name] is a 64 y.o. male with a hx of Coronary artery disease and previous stenting. He has ischemic cardiomyopathy with an ejection fraction of 25-30%.   No CP or dyspnea His EF has improved  EF in 40-45%. 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  He also has a history of hyperlipidemia. His left ventricular systolic function has improved to 40-45%. His breathing is good.  He is not having any episodes of angina.  He seems to be tolerating his medications quite well.  April 04, 2018: Dwayne [name] is seen today for follow up of his CAD and chronic systolic CHF.  Has had a cold . Has not felt as well .   Had an episode of dizziness while at the beach.   Called EMS.  Was checked out and did not go to ER .   Growing his beard again to be [place] this Christmas     Jan. 8, 2021:  Dwayne [name] is seen today for follow-up of his coronary artery disease and chronic systolic congestive heart failure. No cp or dyspnea.   Wt is 234 lbs.    Body mass index is 33.58 kg/m.    Past Medical History:  Diagnosis Date  . Acute ST elevation myocardial infarction (STEMI) involving left anterior descending (LAD) coronary artery ([place])   . Adenomatous colon polyp 2000  . Arthritis   . Coronary artery disease involving native coronary artery of native heart without  angina pectoris 03/23/2016  . Diverticulosis   . Erectile dysfunction   . History of stroke 01/23/2017  . Hypercholesteremia   . Hypertension   . Internal hemorrhoids   . Kidney stones   . Myocardial infarction ([place])   . Occipital cerebral infarction ([place]) 03/23/2016  . Splenomegaly     Past Surgical History:  Procedure Laterality Date  . CARDIAC CATHETERIZATION N/A 01/20/2016   Procedure: Left Heart Cath and Coronary Angiography;  Surgeon: Burnell Blanks, MD; LAD 100%, D1 100%, multi-vessel dz 10-30% in other vessels, EF 25-35%, elevated LVEDP  . CARDIAC CATHETERIZATION N/A 01/20/2016   Procedure: Coronary Stent Intervention;  Surgeon: Burnell Blanks, MD; PROMUS PREM MR 3.5X16 mm DES LAD, D1 jailed by the stent but was already occluded  . CHOLECYSTECTOMY  2009  . COLONOSCOPY    . EYE SURGERY     Age 32  . [place] SURGERY  1993  . REFRACTIVE SURGERY  2008  . SHOULDER SURGERY  1998   Left shoulder fracture-clavicle  . WRIST FRACTURE SURGERY  1995   Bilateral    Current Medications: Current Meds  Medication Sig  . AMBULATORY NON FORMULARY MEDICATION Medication Name: CBD Oil - daily  . aspirin 81 MG chewable tablet Chew 1 tablet (81 mg total) by mouth daily.  . carvedilol (COREG) 25 MG tablet  TAKE 1 TABLET BY MOUTH TWICE A DAY  . lisinopril (ZESTRIL) 5 MG tablet TAKE 1 TABLET BY MOUTH EVERY DAY  . Multiple Vitamin (MULTIVITAMIN WITH MINERALS) TABS tablet Take 1 tablet by mouth daily.  . nitroGLYCERIN (NITROSTAT) 0.4 MG SL tablet Place 1 tablet (0.4 mg total) under the tongue every 5 (five) minutes as needed for chest pain.  . rosuvastatin (CRESTOR) 40 MG tablet Take 40 mg by mouth daily.  . ticagrelor (BRILINTA) 60 MG TABS tablet Take 1 tablet (60 mg total) by mouth 2 (two) times daily. Please keep upcoming appt for future refills. Thank you     Allergies:   Codeine and Lipitor [atorvastatin]   Social History   Socioeconomic History  . Marital status:  Married    Spouse name: Not on file  . Number of children: 1  . Years of education: Not on file  . Highest education level: Not on file  Occupational History  . Occupation: Proofreader  Tobacco Use  . Smoking status: Never Smoker  . Smokeless tobacco: Never Used  Substance and Sexual Activity  . Alcohol use: Yes    Alcohol/week: 0.0 standard drinks    Comment: 2+ per week  . Drug use: No  . Sexual activity: Not on file  Other Topics Concern  . Not on file  Social History Narrative  . Not on file   Social Determinants of Health   Financial Resource Strain:   . Difficulty of Paying Living Expenses: Not on file  Food Insecurity:   . Worried About Charity fundraiser in the Last Year: Not on file  . Ran Out of Food in the Last Year: Not on file  Transportation Needs:   . Lack of Transportation (Medical): Not on file  . Lack of Transportation (Non-Medical): Not on file  Physical Activity:   . Days of Exercise per Week: Not on file  . Minutes of Exercise per Session: Not on file  Stress:   . Feeling of Stress : Not on file  Social Connections:   . Frequency of Communication with Friends and Family: Not on file  . Frequency of Social Gatherings with Friends and Family: Not on file  . Attends Religious Services: Not on file  . Active Member of Clubs or Organizations: Not on file  . Attends Archivist Meetings: Not on file  . Marital Status: Not on file     Family History: The patient's family history includes CVA in his brother, brother, brother, and father; Heart disease in his father. There is no history of Colon polyps, Colon cancer, Esophageal cancer, Stomach cancer, Pancreatic cancer, or Liver disease. ROS:   Please see the history of present illness.     All other systems reviewed and are negative.    Recent Labs: No results found for requested labs within last 8760 hours.  Recent Lipid Panel    Component Value Date/Time   CHOL 115 04/04/2018 0838    TRIG 103 04/04/2018 0838   HDL 41 04/04/2018 0838   CHOLHDL 2.8 04/04/2018 0838   CHOLHDL 3.6 03/23/2016 1003   VLDL 27 03/23/2016 1003   LDLCALC 53 04/04/2018 0838    Physical Exam: Blood pressure 132/82, pulse 76, height 5\' 10"  (1.778 m), weight 234 lb (106.1 kg), SpO2 97 %.  GEN:  Well nourished, well developed in no acute distress HEENT: Normal NECK: No JVD; No carotid bruits LYMPHATICS: No lymphadenopathy CARDIAC: RRR , no murmurs, rubs, gallops RESPIRATORY:  Clear  to auscultation without rales, wheezing or rhonchi  ABDOMEN: Soft, non-tender, non-distended MUSCULOSKELETAL:  No edema; No deformity  SKIN: Warm and dry NEUROLOGIC:  Alert and oriented x 3   EKG:   June 07, 2019: Normal sinus rhythm at 76.  Nonspecific ST and T wave changes.  ASSESSMENT:    1. Coronary artery disease involving native coronary artery of native heart without angina pectoris   2. Chronic systolic heart failure (HCC)    PLAN:      1.  CAD -  No CP ,   We discussed changing his Brilinta to Plavix.  He wants to stay on the Brilinta 60 mg twice a day.  He is not having any angina.  2.   Chronic combined systolic and diastolic CHF:  His ejection fraction is mildly depressed.  Continue carvedilol and lisinopril for now.  We will see him again in 1 year for follow-up visit.  Medication Adjustments/Labs and Tests Ordered: Current medicines are reviewed at length with the patient today.  Concerns regarding medicines are outlined above.  Orders Placed This Encounter  Procedures  . EKG 12-Lead   No orders of the defined types were placed in this encounter.    Signed, Mertie Moores, MD  06/07/2019 4:51 PM    [facility]

## 2019-06-07 NOTE — Patient Instructions (Signed)
Medication Instructions:  Your physician recommends that you continue on your current medications as directed. Please refer to the Current Medication list given to you today.  *If you need a refill on your cardiac medications before your next appointment, please call your pharmacy*  Lab Work: None Ordered    Testing/Procedures: None Ordered   Follow-Up: At [facility], you and your health needs are our priority.  As part of our continuing mission to provide you with exceptional heart care, we have created designated Provider Care Teams.  These Care Teams include your primary Cardiologist (physician) and Advanced Practice Providers (APPs -  Physician Assistants and Nurse Practitioners) who all work together to provide you with the care you need, when you need it.  Your next appointment:   1 year(s)  The format for your next appointment:   In Person  Provider:   You may see Mertie Moores, MD or one of the following Advanced Practice Providers on your designated Care Team:    Richardson Dopp, PA-C  [place], [place]  Daune Perch, [place]

## 2019-07-09 ENCOUNTER — Ambulatory Visit: Payer: PPO | Attending: Internal Medicine

## 2019-07-09 DIAGNOSIS — Z23 Encounter for immunization: Secondary | ICD-10-CM

## 2019-07-09 NOTE — Progress Notes (Signed)
   Covid-19 Vaccination Clinic  Name:  Dwayne [name]    MRN: [id number] DOB: Jul 08, 1955  07/09/2019  Dwayne [name] was observed post Covid-19 immunization for 15 minutes without incidence. He was provided with Vaccine Information Sheet and instruction to access the V-Safe system.   Dwayne [name] was instructed to call 911 with any severe reactions post vaccine: Marland Kitchen Difficulty breathing  . Swelling of your face and throat  . A fast heartbeat  . A bad rash all over your body  . Dizziness and weakness    Immunizations Administered    Name Date Dose VIS Date Route   Pfizer COVID-19 Vaccine 07/09/2019  3:11 PM 0.3 mL 05/10/2019 Intramuscular   Manufacturer: [place]   Lot: [id number]   [place]: [id number]

## 2019-08-03 ENCOUNTER — Ambulatory Visit: Payer: PPO | Attending: Internal Medicine

## 2019-08-03 DIAGNOSIS — Z23 Encounter for immunization: Secondary | ICD-10-CM

## 2019-08-03 NOTE — Progress Notes (Signed)
   Covid-19 Vaccination Clinic  Name:  Dwayne [name]    MRN: [id number] DOB: 10-Oct-1955  08/03/2019  [name] was observed post Covid-19 immunization for 15 minutes without incident. He was provided with Vaccine Information Sheet and instruction to access the V-Safe system.   Mr. Danger was instructed to call 911 with any severe reactions post vaccine: Marland Kitchen Difficulty breathing  . Swelling of face and throat  . A fast heartbeat  . A bad rash all over body  . Dizziness and weakness   Immunizations Administered    Name Date Dose VIS Date Route   Pfizer COVID-19 Vaccine 08/03/2019  9:00 AM 0.3 mL 05/10/2019 Intramuscular   Manufacturer: [place]   Lot: [id number]   [place]: [id number]

## 2019-08-16 ENCOUNTER — Other Ambulatory Visit: Payer: Self-pay | Admitting: Cardiovascular Disease

## 2019-08-16 MED ORDER — TICAGRELOR 60 MG PO TABS: 60.0000 mg | ORAL_TABLET | Freq: Two times a day (BID) | ORAL | 2 refills | Status: DC

## 2019-08-23 DIAGNOSIS — I2510 Atherosclerotic heart disease of native coronary artery without angina pectoris: Secondary | ICD-10-CM | POA: Diagnosis not present

## 2019-08-23 DIAGNOSIS — E7800 Pure hypercholesterolemia, unspecified: Secondary | ICD-10-CM | POA: Diagnosis not present

## 2019-08-23 DIAGNOSIS — I208 Other forms of angina pectoris: Secondary | ICD-10-CM | POA: Diagnosis not present

## 2019-08-23 DIAGNOSIS — Z0000 Encounter for general adult medical examination without abnormal findings: Secondary | ICD-10-CM | POA: Diagnosis not present

## 2019-08-23 DIAGNOSIS — I10 Essential (primary) hypertension: Secondary | ICD-10-CM | POA: Diagnosis not present

## 2019-08-23 DIAGNOSIS — Z23 Encounter for immunization: Secondary | ICD-10-CM | POA: Diagnosis not present

## 2019-08-23 DIAGNOSIS — Z125 Encounter for screening for malignant neoplasm of prostate: Secondary | ICD-10-CM | POA: Diagnosis not present

## 2019-08-23 DIAGNOSIS — R7309 Other abnormal glucose: Secondary | ICD-10-CM | POA: Diagnosis not present

## 2019-10-30 ENCOUNTER — Other Ambulatory Visit: Payer: Self-pay | Admitting: Cardiovascular Disease

## 2020-05-02 ENCOUNTER — Other Ambulatory Visit: Payer: Self-pay | Admitting: Cardiovascular Disease

## 2020-05-03 ENCOUNTER — Other Ambulatory Visit: Payer: Self-pay | Admitting: Cardiovascular Disease

## 2020-05-12 ENCOUNTER — Other Ambulatory Visit: Payer: Self-pay | Admitting: Cardiovascular Disease

## 2020-05-13 MED ORDER — TICAGRELOR 60 MG PO TABS: 60.0000 mg | ORAL_TABLET | Freq: Two times a day (BID) | ORAL | 0 refills | Status: DC

## 2020-06-05 ENCOUNTER — Ambulatory Visit: Payer: PPO | Admitting: Cardiovascular Disease

## 2020-06-21 ENCOUNTER — Encounter: Payer: Self-pay | Admitting: Cardiovascular Disease

## 2020-06-21 NOTE — Progress Notes (Signed)
Cardiology Office Note:    Date:  06/22/2020   ID:  [name], DOB Jun 12, 1955, MRN [id number]  PCP:  Gaynelle Arabian, MD  Cardiologist:  Mertie Moores, MD    Referring MD: Gaynelle Arabian, MD   Problem list 1. Coronary artery disease-status post DES to the proximal/mid LAD in August, 2017 2. Chronic systolic congestive heart failure 3. Mild aortic insufficiency 4. Mild mitral regurgitation    Chief Complaint  Patient presents with  . Coronary Artery Disease    Prior notes:    Dwayne [name] is a 65 y.o. male with a hx of Coronary artery disease and previous stenting. He has ischemic cardiomyopathy with an ejection fraction of 25-30%.   No CP or dyspnea His EF has improved  EF in 40-45%. 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  He also has a history of hyperlipidemia. His left ventricular systolic function has improved to 40-45%. His breathing is good.  He is not having any episodes of angina.  He seems to be tolerating his medications quite well.  April 04, 2018: Dwayne [name] is seen today for follow up of his CAD and chronic systolic CHF.  Has had a cold . Has not felt as well .   Had an episode of dizziness while at the beach.   Called EMS.  Was checked out and did not go to ER .   Growing his beard again to be [place] this Christmas     Jan. 8, 2021:  Dwayne [name] is seen today for follow-up of his coronary artery disease and chronic systolic congestive heart failure. No cp or dyspnea.   Wt is 234 lbs.    Body mass index is 33.58 kg/m.   Jan. 24, 2022: Dwayne [name] is seen today for follow up of his CAD and CHF Wt is He has a hx of NSTEMI in Aug. 2017.   He was found to have an occlusion of his LAD which was stented.  His LV function was initially around 25% but improved to 40-45% by his last echo in Nov.  2017 He has his lipids managed by Dr. Marisue Humble .  His labs from March, 2021 reveal a total cholesterol of 124.  The HDL is 41.  The triglyceride level is 123.  The LDL is 60.  Past Medical History:  Diagnosis Date  . Acute ST elevation myocardial infarction (STEMI) involving left anterior descending (LAD) coronary artery ([place])   . Adenomatous colon polyp 2000  . Arthritis   . Coronary artery disease involving native coronary artery of native heart without angina pectoris 03/23/2016  . Diverticulosis   . Erectile dysfunction   . History of stroke 01/23/2017  . Hypercholesteremia   . Hypertension   . Internal hemorrhoids   . Kidney stones   . Myocardial infarction ([place])   . Occipital cerebral infarction ([place]) 03/23/2016  . Splenomegaly     Past Surgical History:  Procedure Laterality Date  . CARDIAC CATHETERIZATION N/A 01/20/2016   Procedure: Left Heart Cath and Coronary Angiography;  Surgeon: Burnell Blanks, MD; LAD 100%, D1 100%, multi-vessel dz 10-30% in other vessels, EF 25-35%, elevated LVEDP  . CARDIAC CATHETERIZATION N/A 01/20/2016   Procedure: Coronary Stent Intervention;  Surgeon: Burnell Blanks, MD; PROMUS PREM MR 3.5X16 mm DES LAD, D1 jailed by the stent but  was already occluded  . CHOLECYSTECTOMY  2009  . COLONOSCOPY    . EYE SURGERY     Age 65  . [place] SURGERY  1993  . REFRACTIVE SURGERY  2008  . SHOULDER SURGERY  1998   Left shoulder fracture-clavicle  . WRIST FRACTURE SURGERY  1995   Bilateral    Current Medications: Current Meds  Medication Sig  . AMBULATORY NON FORMULARY MEDICATION Medication Name: CBD Oil - daily  . aspirin 81 MG chewable tablet Chew 1 tablet (81 mg total) by mouth daily.  . carvedilol (COREG) 25 MG tablet Take 1 tablet (25 mg total) by mouth 2 (two) times daily. Please keep upcoming appt in January 2022 with Dr. Acie Fredrickson before anymore refills. Thank you  . lisinopril (ZESTRIL) 5 MG tablet TAKE 1 TABLET BY MOUTH EVERY DAY   . Multiple Vitamin (MULTIVITAMIN WITH MINERALS) TABS tablet Take 1 tablet by mouth daily.  . nitroGLYCERIN (NITROSTAT) 0.4 MG SL tablet Place 1 tablet (0.4 mg total) under the tongue every 5 (five) minutes as needed for chest pain.  . rosuvastatin (CRESTOR) 40 MG tablet Take 40 mg by mouth daily.  . [DISCONTINUED] clopidogrel (PLAVIX) 75 MG tablet Take 1 tablet (75 mg total) by mouth daily.  . [DISCONTINUED] ticagrelor (BRILINTA) 60 MG TABS tablet Take 1 tablet (60 mg total) by mouth 2 (two) times daily. Please keep upcoming appt in January 2022 with Dr. Acie Fredrickson before anymore refills. Thank you     Allergies:   Codeine and Lipitor [atorvastatin]   Social History   Socioeconomic History  . Marital status: Married    Spouse name: Not on file  . Number of children: 1  . Years of education: Not on file  . Highest education level: Not on file  Occupational History  . Occupation: Proofreader  Tobacco Use  . Smoking status: Never Smoker  . Smokeless tobacco: Never Used  Vaping Use  . Vaping Use: Never used  Substance and Sexual Activity  . Alcohol use: Yes    Alcohol/week: 0.0 standard drinks    Comment: 2+ per week  . Drug use: No  . Sexual activity: Not on file  Other Topics Concern  . Not on file  Social History Narrative  . Not on file   Social Determinants of Health   Financial Resource Strain: Not on file  Food Insecurity: Not on file  Transportation Needs: Not on file  Physical Activity: Not on file  Stress: Not on file  Social Connections: Not on file     Family History: The patient's family history includes CVA in his brother, brother, brother, and father; Heart disease in his father. There is no history of Colon polyps, Colon cancer, Esophageal cancer, Stomach cancer, Pancreatic cancer, or Liver disease. ROS:   Please see the history of present illness.     All other systems reviewed and are negative.    Recent Labs: No results found for requested labs  within last 8760 hours.  Recent Lipid Panel    Component Value Date/Time   CHOL 115 04/04/2018 0838   TRIG 103 04/04/2018 0838   HDL 41 04/04/2018 0838   CHOLHDL 2.8 04/04/2018 0838   CHOLHDL 3.6 03/23/2016 1003   VLDL 27 03/23/2016 1003   LDLCALC 53 04/04/2018 0838   Physical Exam: Blood pressure 126/84, pulse 82, height 5\' 10"  (1.778 m), weight 234 lb 12.8 oz (106.5 kg), SpO2 97 %.  GEN:  Well nourished, well developed in no acute distress  HEENT: Normal NECK: No JVD; No carotid bruits LYMPHATICS: No lymphadenopathy CARDIAC: RRR , no murmurs, rubs, gallops RESPIRATORY:  Clear to auscultation without rales, wheezing or rhonchi  ABDOMEN: Soft, non-tender, non-distended MUSCULOSKELETAL:  No edema; No deformity  SKIN: Warm and dry NEUROLOGIC:  Alert and oriented x 3   EKG:   June 22, 2020: Normal sinus rhythm at 82.  He has nonspecific ST and T wave abnormalities.   ASSESSMENT:    1. Acute ST elevation myocardial infarction (STEMI) involving left anterior descending (LAD) coronary artery ([place])   2. Chronic systolic heart failure ([place])   3. Chronic combined systolic and diastolic heart failure ([place])   4. Coronary artery disease involving native coronary artery of native heart without angina pectoris    PLAN:      1.  CAD -doing well.  He is not having any episodes of angina.  Currently on Brilinta.  Brilinta is going to cost him more money this year.  We will plan on changing him to Plavix 75 mg a day.  He will take 150 mg on his first day.   2.   Chronic combined systolic and diastolic CHF:   Doing well.  Cont current meds. - lisinopril , coreg   Medication Adjustments/Labs and Tests Ordered: Current medicines are reviewed at length with the patient today.  Concerns regarding medicines are outlined above.  Orders Placed This Encounter  Procedures  . EKG 12-Lead   Meds ordered this encounter  Medications  . DISCONTD: clopidogrel (PLAVIX) 75 MG tablet    Sig: Take  1 tablet (75 mg total) by mouth daily.    Dispense:  90 tablet    Refill:  3  . clopidogrel (PLAVIX) 75 MG tablet    Sig: Take 1 tablet (75 mg total) by mouth daily.    Dispense:  90 tablet    Refill:  3      Signed, Mertie Moores, MD  06/22/2020 5:37 PM    [place]

## 2020-06-22 ENCOUNTER — Encounter: Payer: Self-pay | Admitting: Cardiovascular Disease

## 2020-06-22 ENCOUNTER — Ambulatory Visit: Payer: PPO | Admitting: Cardiovascular Disease

## 2020-06-22 ENCOUNTER — Other Ambulatory Visit: Payer: Self-pay

## 2020-06-22 VITALS — BP 126/84 | HR 82 | Ht 70.0 in | Wt 234.8 lb

## 2020-06-22 DIAGNOSIS — I5042 Chronic combined systolic (congestive) and diastolic (congestive) heart failure: Secondary | ICD-10-CM | POA: Diagnosis not present

## 2020-06-22 DIAGNOSIS — I5022 Chronic systolic (congestive) heart failure: Secondary | ICD-10-CM

## 2020-06-22 DIAGNOSIS — I2510 Atherosclerotic heart disease of native coronary artery without angina pectoris: Secondary | ICD-10-CM

## 2020-06-22 DIAGNOSIS — I2102 ST elevation (STEMI) myocardial infarction involving left anterior descending coronary artery: Secondary | ICD-10-CM | POA: Diagnosis not present

## 2020-06-22 MED ORDER — CLOPIDOGREL BISULFATE 75 MG PO TABS
75.0000 mg | ORAL_TABLET | Freq: Every day | ORAL | 3 refills | Status: DC
Start: 2020-06-22 — End: 2021-03-17

## 2020-06-22 MED ORDER — CLOPIDOGREL BISULFATE 75 MG PO TABS
75.0000 mg | ORAL_TABLET | Freq: Every day | ORAL | 3 refills | Status: DC
Start: 2020-06-22 — End: 2020-06-22

## 2020-06-22 NOTE — Patient Instructions (Signed)
Medication Instructions:  Your physician has recommended you make the following change in your medication: 1) STOP taking Brilinta  2) START taking Plavix 75 mg daily - on your first day take 2 tablets (150 mg total).   *If you need a refill on your cardiac medications before your next appointment, please call your pharmacy*   Follow-Up: At [facility], you and your health needs are our priority.  As part of our continuing mission to provide you with exceptional heart care, we have created designated Provider Care Teams.  These Care Teams include your primary Cardiologist (physician) and Advanced Practice Providers (APPs -  Physician Assistants and Nurse Practitioners) who all work together to provide you with the care you need, when you need it.  We recommend signing up for the patient portal called "MyChart".  Sign up information is provided on this After Visit Summary.  MyChart is used to connect with patients for Virtual Visits (Telemedicine).  Patients are able to view lab/test results, encounter notes, upcoming appointments, etc.  Non-urgent messages can be sent to your provider as well.   To learn more about what you can do with MyChart, go to [email or web address].    Your next appointment:   1 year(s)  The format for your next appointment:   In Person  Provider:   You may see Dr. Glenford Bayley or one of the following Advanced Practice Providers on your designated Care Team:    Melina Copa, PA-C  Ermalinda Barrios, PA-C

## 2020-07-28 ENCOUNTER — Other Ambulatory Visit: Payer: Self-pay | Admitting: Cardiovascular Disease

## 2020-09-03 DIAGNOSIS — I10 Essential (primary) hypertension: Secondary | ICD-10-CM | POA: Diagnosis not present

## 2020-09-03 DIAGNOSIS — I2510 Atherosclerotic heart disease of native coronary artery without angina pectoris: Secondary | ICD-10-CM | POA: Diagnosis not present

## 2020-09-03 DIAGNOSIS — I208 Other forms of angina pectoris: Secondary | ICD-10-CM | POA: Diagnosis not present

## 2020-09-03 DIAGNOSIS — E7800 Pure hypercholesterolemia, unspecified: Secondary | ICD-10-CM | POA: Diagnosis not present

## 2020-09-03 DIAGNOSIS — Z0000 Encounter for general adult medical examination without abnormal findings: Secondary | ICD-10-CM | POA: Diagnosis not present

## 2020-09-03 DIAGNOSIS — Z23 Encounter for immunization: Secondary | ICD-10-CM | POA: Diagnosis not present

## 2020-09-03 DIAGNOSIS — R350 Frequency of micturition: Secondary | ICD-10-CM | POA: Diagnosis not present

## 2020-09-03 DIAGNOSIS — B351 Tinea unguium: Secondary | ICD-10-CM | POA: Diagnosis not present

## 2020-09-03 DIAGNOSIS — R7309 Other abnormal glucose: Secondary | ICD-10-CM | POA: Diagnosis not present

## 2020-09-03 DIAGNOSIS — Z1159 Encounter for screening for other viral diseases: Secondary | ICD-10-CM | POA: Diagnosis not present

## 2020-11-19 ENCOUNTER — Ambulatory Visit (INDEPENDENT_AMBULATORY_CARE_PROVIDER_SITE_OTHER): Payer: PPO | Admitting: Podiatry

## 2020-11-19 ENCOUNTER — Encounter: Payer: Self-pay | Admitting: Podiatry

## 2020-11-19 ENCOUNTER — Other Ambulatory Visit: Payer: Self-pay

## 2020-11-19 DIAGNOSIS — L603 Nail dystrophy: Secondary | ICD-10-CM | POA: Diagnosis not present

## 2020-11-19 DIAGNOSIS — N200 Calculus of kidney: Secondary | ICD-10-CM | POA: Insufficient documentation

## 2020-11-19 DIAGNOSIS — K5730 Diverticulosis of large intestine without perforation or abscess without bleeding: Secondary | ICD-10-CM | POA: Insufficient documentation

## 2020-11-19 DIAGNOSIS — B351 Tinea unguium: Secondary | ICD-10-CM | POA: Diagnosis not present

## 2020-11-19 DIAGNOSIS — I208 Other forms of angina pectoris: Secondary | ICD-10-CM | POA: Insufficient documentation

## 2020-11-19 NOTE — Progress Notes (Signed)
Subjective:  Patient ID: Dwayne [name], male    DOB: 01-May-1956,  MRN: [id number] HPI Chief Complaint  Patient presents with   Nail Problem    Hallux bilateral - toenails thick and discolored x years, tried OTC meds - no help   New Patient (Initial Visit)    65 y.o. male presents with the above complaint.   ROS: Denies fever chills nausea vomiting muscle aches pains calf pain back pain chest pain shortness of breath.  Past Medical History:  Diagnosis Date   Acute ST elevation myocardial infarction (STEMI) involving left anterior descending (LAD) coronary artery (HCC)    Adenomatous colon polyp 2000   Arthritis    Coronary artery disease involving native coronary artery of native heart without angina pectoris 03/23/2016   Diverticulosis    Erectile dysfunction    History of stroke 01/23/2017   Hypercholesteremia    Hypertension    Internal hemorrhoids    Kidney stones    Myocardial infarction [facility])    Occipital cerebral infarction ([place]) 03/23/2016   Splenomegaly    Past Surgical History:  Procedure Laterality Date   CARDIAC CATHETERIZATION N/A 01/20/2016   Procedure: Left Heart Cath and Coronary Angiography;  Surgeon: Burnell Blanks, MD; LAD 100%, D1 100%, multi-vessel dz 10-30% in other vessels, EF 25-35%, elevated LVEDP   CARDIAC CATHETERIZATION N/A 01/20/2016   Procedure: Coronary Stent Intervention;  Surgeon: Burnell Blanks, MD; PROMUS PREM MR 3.5X16 mm DES LAD, D1 jailed by the stent but was already occluded   CHOLECYSTECTOMY  2009   COLONOSCOPY     EYE SURGERY     Age [place]  2008   SHOULDER SURGERY  1998   Left shoulder fracture-clavicle   WRIST FRACTURE SURGERY  1995   Bilateral    Current Outpatient Medications:    AMBULATORY NON FORMULARY MEDICATION, Medication Name: CBD Oil - daily, Disp: , Rfl:    aspirin 81 MG chewable tablet, Chew 1 tablet (81 mg total) by mouth daily., Disp: , Rfl:     carvedilol (COREG) 25 MG tablet, TAKE 1 TABLET BY MOUTH 2 (TWO) TIMES DAILY. PLEASE KEEP UPCOMING APPT IN JANUARY 2022, Disp: 180 tablet, Rfl: 3   clopidogrel (PLAVIX) 75 MG tablet, Take 1 tablet (75 mg total) by mouth daily., Disp: 90 tablet, Rfl: 3   lisinopril (ZESTRIL) 5 MG tablet, TAKE 1 TABLET BY MOUTH EVERY DAY, Disp: 90 tablet, Rfl: 3   Multiple Vitamin (MULTIVITAMIN WITH MINERALS) TABS tablet, Take 1 tablet by mouth daily., Disp: , Rfl:    nitroGLYCERIN (NITROSTAT) 0.4 MG SL tablet, Place 1 tablet (0.4 mg total) under the tongue every 5 (five) minutes as needed for chest pain., Disp: 25 tablet, Rfl: 3   rosuvastatin (CRESTOR) 40 MG tablet, Take 40 mg by mouth daily., Disp: , Rfl:    SHINGRIX injection, , Disp: , Rfl:   Allergies  Allergen Reactions   Codeine Nausea Only   Lipitor [Atorvastatin] Other (See Comments)    Myalgia   Review of Systems Objective:  There were no vitals filed for this visit.  General: Well developed, nourished, in no acute distress, alert and oriented x3   Dermatological: Skin is warm, dry and supple bilateral. Nails x 10 are well maintained; remaining integument appears unremarkable at this time. There are no open sores, no preulcerative lesions, no rash or signs of infection present.  Hallux nails bilaterally are thick yellow dystrophic with subungual debris  left greater than right  Vascular: Dorsalis Pedis artery and Posterior Tibial artery pedal pulses are 2/4 bilateral with immedate capillary fill time. Pedal hair growth present. No varicosities and no lower extremity edema present bilateral.   Neruologic: Grossly intact via light touch bilateral. Vibratory intact via tuning fork bilateral. Protective threshold with Semmes Wienstein monofilament intact to all pedal sites bilateral. Patellar and Achilles deep tendon reflexes 2+ bilateral. No Babinski or clonus noted bilateral.   Musculoskeletal: No gross boney pedal deformities bilateral. No pain,  crepitus, or limitation noted with foot and ankle range of motion bilateral. Muscular strength 5/5 in all groups tested bilateral.  Gait: Unassisted, Nonantalgic.    Radiographs:  None taken  Assessment & Plan:   Assessment: Nail dystrophy cannot rule out onychomycosis  Plan: Samples of skin and nail were taken today for pathologic evaluation.     [name] T. [place], [place]

## 2020-12-22 ENCOUNTER — Other Ambulatory Visit: Payer: Self-pay

## 2020-12-22 ENCOUNTER — Ambulatory Visit: Payer: PPO | Admitting: Podiatry

## 2020-12-22 ENCOUNTER — Encounter: Payer: Self-pay | Admitting: Podiatry

## 2020-12-22 DIAGNOSIS — L603 Nail dystrophy: Secondary | ICD-10-CM

## 2020-12-22 MED ORDER — TERBINAFINE HCL 250 MG PO TABS: 250.0000 mg | ORAL_TABLET | Freq: Every day | ORAL | 0 refills | Status: DC

## 2020-12-22 NOTE — Progress Notes (Signed)
He presents today for follow-up of his pathology report regarding his toenails.  Pathology results do demonstrate onychomycosis.  Assessment: Onychomycosis.  Plan: Discussed in great detail today for different options 1 being that he does nothing to being a topical pain on 3 laser therapy and for oral therapy.  He would like to do oral therapy.  We discussed in great detail of oral therapy today side effects and complications associated with this.  He is recently had blood work done site evaluated with blood work demonstrating that there was normal BUN/creatinine as well as AST and ALT.  At this point I will start him on Lamisil 250 mg tablets 1 p.o. daily and I will follow-up with him in 30 days for another set of blood work.

## 2020-12-22 NOTE — Patient Instructions (Signed)
Terbinafine tablets What is this medication? TERBINAFINE (TER bin a feen) is an antifungal medicine. It is used to treatcertain kinds of fungal or yeast infections. This medicine may be used for other purposes; ask your health care provider orpharmacist if you have questions. COMMON BRAND NAME(S): Lamisil, Terbinex What should I tell my care team before I take this medication? They need to know if you have any of these conditions: drink alcoholic beverages kidney disease liver disease an unusual or allergic reaction to terbinafine, other medicines, foods, dyes, or preservatives pregnant or trying to get pregnant breast-feeding How should I use this medication? Take this medicine by mouth with a full glass of water. Follow the directions on the prescription label. You can take this medicine with food or on an empty stomach. Take your medicine at regular intervals. Do not take your medicine more often than directed. Do not skip doses or stop your medicine early even ifyou feel better. Do not stop taking except on your doctor's advice. A special MedGuide will be given to you by the pharmacist with eachprescription and refill. Be sure to read this information carefully each time. Talk to your pediatrician regarding the use of this medicine in children.Special care may be needed. Overdosage: If you think you have taken too much of this medicine contact apoison control center or emergency room at once. NOTE: This medicine is only for you. Do not share this medicine with others. What if I miss a dose? If you miss a dose, take it as soon as you can. If it is almost time for yournext dose, take only that dose. Do not take double or extra doses. What may interact with this medication? Do not take this medicine with any of the following medications: thioridazine This medicine may also interact with the following medications: beta-blockers caffeine cimetidine cyclosporine medicines for depression,  anxiety, or psychotic disturbances medicines for fungal infections like fluconazole and ketoconazole medicines for irregular heartbeat like amiodarone, flecainide and propafenone rifampin warfarin This list may not describe all possible interactions. Give your health care provider a list of all the medicines, herbs, non-prescription drugs, or dietary supplements you use. Also tell them if you smoke, drink alcohol, or use illegaldrugs. Some items may interact with your medicine. What should I watch for while using this medication? Visit your doctor or health care provider regularly. Tell your doctor right away if you have nausea or vomiting, loss of appetite, stomach pain on your right upper side, yellow skin, dark urine, light stools, or are over tired. Some fungal infections need many weeks or months of treatment to cure. If you are taking this medicine for a long time, you will need to have important bloodwork done. This medicine may cause serious skin reactions. They can happen weeks to months after starting the medicine. Contact your health care provider right away if you notice fevers or flu-like symptoms with a rash. The rash may be red or purple and then turn into blisters or peeling of the skin. Or, you might notice a red rash with swelling of the face, lips or lymph nodes in your neck or underyour arms. What side effects may I notice from receiving this medication? Side effects that you should report to your doctor or health care professionalas soon as possible: allergic reactions like skin rash or hives, swelling of the face, lips, or tongue changes in vision dark urine fever or infection general ill feeling or flu-like symptoms light-colored stools loss of appetite, nausea rash, fever,   and swollen lymph nodes redness, blistering, peeling or loosening of the skin, including inside the mouth right upper belly pain unusually weak or tired yellowing of the eyes or skin Side effects that  usually do not require medical attention (report to yourdoctor or health care professional if they continue or are bothersome): changes in taste diarrhea hair loss muscle or joint pain stomach gas stomach upset This list may not describe all possible side effects. Call your doctor for medical advice about side effects. You may report side effects to FDA at1-800-FDA-1088. Where should I keep my medication? Keep out of the reach of children. Store at room temperature below 25 degrees C (77 degrees F). Protect fromlight. Throw away any unused medicine after the expiration date. NOTE: This sheet is a summary. It may not cover all possible information. If you have questions about this medicine, talk to your doctor, pharmacist, orhealth care provider.  2022 Elsevier/Gold Standard (2018-08-24 15:37:07)  

## 2021-01-05 DIAGNOSIS — M25551 Pain in right hip: Secondary | ICD-10-CM | POA: Diagnosis not present

## 2021-01-05 DIAGNOSIS — M1611 Unilateral primary osteoarthritis, right hip: Secondary | ICD-10-CM | POA: Diagnosis not present

## 2021-01-26 ENCOUNTER — Other Ambulatory Visit: Payer: Self-pay

## 2021-01-26 ENCOUNTER — Encounter: Payer: Self-pay | Admitting: Podiatry

## 2021-01-26 ENCOUNTER — Ambulatory Visit: Payer: PPO | Admitting: Podiatry

## 2021-01-26 DIAGNOSIS — Z79899 Other long term (current) drug therapy: Secondary | ICD-10-CM

## 2021-01-26 DIAGNOSIS — L603 Nail dystrophy: Secondary | ICD-10-CM | POA: Diagnosis not present

## 2021-01-26 MED ORDER — TERBINAFINE HCL 250 MG PO TABS: 250.0000 mg | ORAL_TABLET | Freq: Every day | ORAL | 0 refills | Status: DC

## 2021-01-26 NOTE — Progress Notes (Signed)
He presents today for follow-up of his nail fungus.  He has completed his first 30 days of Lamisil with no ill effects.  He denies fever chills nausea vomiting muscle aches pains calf pain back pain chest pain shortness of breath states that his toenails have started to change ever so slightly.  Objective: Vital signs are stable alert and oriented x3 no change in physical exam does not demonstrate any jaundice.  Assessment: Onychomycosis and nail dystrophy long-term therapy with Lamisil.  Plan: Wrote another prescription for Lamisil 90 tablets 1 tablet each day I will follow-up with him in 4 months we will also request another complete metabolic panel.  Questions or concerns he is to notify me immediately I expressed to him that there is still a chance that he may have reactions to this he will follow-up with me with any questions or concerns notify his primary care provider that he is taking this medication should other medications be prescribed.

## 2021-01-27 LAB — COMPREHENSIVE METABOLIC PANEL
AG Ratio: 2 (calc) (ref 1.0–2.5)
ALT: 23 U/L (ref 9–46)
AST: 15 U/L (ref 10–35)
Albumin: 4.4 g/dL (ref 3.6–5.1)
Alkaline phosphatase (APISO): 57 U/L (ref 35–144)
BUN: 8 mg/dL (ref 7–25)
CO2: 28 mmol/L (ref 20–32)
Calcium: 9 mg/dL (ref 8.6–10.3)
Chloride: 103 mmol/L (ref 98–110)
Creat: 0.84 mg/dL (ref 0.70–1.35)
Globulin: 2.2 g/dL (calc) (ref 1.9–3.7)
Glucose, Bld: 105 mg/dL — ABNORMAL HIGH (ref 65–99)
Potassium: 4.6 mmol/L (ref 3.5–5.3)
Sodium: 139 mmol/L (ref 135–146)
Total Bilirubin: 0.5 mg/dL (ref 0.2–1.2)
Total Protein: 6.6 g/dL (ref 6.1–8.1)

## 2021-02-04 DIAGNOSIS — M1611 Unilateral primary osteoarthritis, right hip: Secondary | ICD-10-CM | POA: Diagnosis not present

## 2021-03-02 ENCOUNTER — Telehealth: Payer: Self-pay | Admitting: Cardiovascular Disease

## 2021-03-02 NOTE — Telephone Encounter (Signed)
Primary Cardiologist:Philip Nahser, MD  Chart reviewed as part of pre-operative protocol coverage. Because of SHAIN PAUWELS past medical history and time since last visit, he/she will require a follow-up visit in order to better assess preoperative cardiovascular risk.  Pre-op covering staff: - Please schedule appointment and call patient to inform them. - Please contact requesting surgeon's office via preferred method (i.e, phone, fax) to inform them of need for appointment prior to surgery.  If applicable, this message will also be routed to pharmacy pool and/or primary cardiologist for input on holding anticoagulant/antiplatelet agent as requested below so that this information is available at time of patient's appointment.   Deberah Pelton, NP  03/02/2021, 11:58 AM

## 2021-03-02 NOTE — Telephone Encounter (Signed)
   [facility] Pre-operative Risk Assessment    Patient Name: Dwayne [name]  DOB: 1955-06-12 MRN: [id number]  HEARTCARE STAFF:  - IMPORTANT!!!!!! Under Visit Info/Reason for Call, type in Other and utilize the format Clearance MM/DD/YY or Clearance TBD. Do not use dashes or single digits. - Please review there is not already an duplicate clearance open for this procedure. - If request is for dental extraction, please clarify the # of teeth to be extracted. - If the patient is currently at the dentist's office, call Pre-Op Callback Staff (MA/nurse) to input urgent request.  - If the patient is not currently in the dentist office, please route to the Pre-Op pool.  Request for surgical clearance:  What type of surgery is being performed? R Hip replacement   When is this surgery scheduled? 03/26/21  What type of clearance is required (medical clearance vs. Pharmacy clearance to hold med vs. Both)? Both  Are there any medications that need to be held prior to surgery and how long? Plavix, TBD by Cardiology  Practice name and name of physician performing surgery? Dr. Frederik Pear, [facility]  What is the office phone number? [phone]   7.   What is the office fax number? [phone] Attn: Wells Guiles   8.   Anesthesia type (None, local, MAC, general) ? Spinal    Johnna Acosta 03/02/2021, 11:50 AM  _________________________________________________________________   (provider comments below)

## 2021-03-02 NOTE — Telephone Encounter (Signed)
Pt is scheduled to see Ermalinda Barrios, PA-C, 03/24/2021, and surgical clearance will be addressed at that time.  Will route back to the requesting surgeon's office to make them aware.

## 2021-03-08 DIAGNOSIS — I2510 Atherosclerotic heart disease of native coronary artery without angina pectoris: Secondary | ICD-10-CM | POA: Diagnosis not present

## 2021-03-08 DIAGNOSIS — I10 Essential (primary) hypertension: Secondary | ICD-10-CM | POA: Diagnosis not present

## 2021-03-08 DIAGNOSIS — Z01818 Encounter for other preprocedural examination: Secondary | ICD-10-CM | POA: Diagnosis not present

## 2021-03-08 DIAGNOSIS — Z01810 Encounter for preprocedural cardiovascular examination: Secondary | ICD-10-CM | POA: Diagnosis not present

## 2021-03-15 ENCOUNTER — Telehealth: Payer: Self-pay

## 2021-03-15 NOTE — Telephone Encounter (Signed)
Walk In-  Pt states he had an episode of CP 10 days ago while in [place] after a large meal and loading the car.  Pt states he took nitro x 1 and pain resolved.  He completed and EKG with a [employer] device which was "ok" per pt.  Pt reports his BP and HR were elevated at the time as well. Pt reports another episode of CP a few days ago that also resolved with nitro.  He states CP is a different type of pain from previous MI.  Pt is scheduled to have hip replacement surgery on 10/28.  He was scheduled to see Ermalinda Barrios PA, next week for pre-op clearance but feels he should not wait to f/u.  Pt denies current CP, SOB or dizziness. Pt advised no available appointments today.  Scheduled to see Dr Gasper Sells 03/17/2021 at 1030am.  Pt advised to take medications as prescribed, continue to monitor BP and HR.  No strenuous activity between now and Wednesday's appointment.  Reviewed ED precautions.  Pt verbalizes understanding and agrees with current plan.

## 2021-03-16 DIAGNOSIS — M25651 Stiffness of right hip, not elsewhere classified: Secondary | ICD-10-CM | POA: Diagnosis not present

## 2021-03-16 DIAGNOSIS — M1611 Unilateral primary osteoarthritis, right hip: Secondary | ICD-10-CM | POA: Diagnosis not present

## 2021-03-16 NOTE — Progress Notes (Signed)
Cardiology Office Note:    Date:  03/17/2021   ID:  Dwayne [name], DOB [birth year], MRN [id number]  PCP:  Gaynelle Arabian, MD  Pronunciation: Per Mar'   [facility] Providers Cardiologist:  Mertie Moores, MD     Referring MD: Gaynelle Arabian, MD   CC:  DOD Pre-Op Dr. [facility] R Hip; Spinal 03/26/21  History of Present Illness:    Dwayne [name] is a 65 y.o. male with a hx of CAD s/p prior PCI (LAD STEMI 2017), Ischemic Cardiomyopathy EF 25-30%-> 40-45%, Prior CVA in 2017 with mild CAS, HTN and HLD seen 03/17/21.  Patient notes that he is doing decent.  Since last visit notes notes that he can't ride motorcycles anymore because of his R hip pain and has finally bought a care. Is able to do household chores (clean out the garage, ride mowing). Has noted a change in his gate related to his hip.  On 03/05/21 had some chest pain:  he was taking care of his granddaughter during a nap battle.  This pain was significantly different that his anginal event (bilateral arm weakness with chest pressure and diaphoresis.  His chest pain was a tinge in his side that felt different.  No discomfort while swimming (outside of him pain).  No SOB/DOE and no PND/Orthopnea.  No weight gain or leg swelling.  No palpitations or syncope.   Past Medical History:  Diagnosis Date   Acute ST elevation myocardial infarction (STEMI) involving left anterior descending (LAD) coronary artery (HCC)    Adenomatous colon polyp 2000   Arthritis    Coronary artery disease involving native coronary artery of native heart without angina pectoris 03/23/2016   Diverticulosis    Erectile dysfunction    History of stroke 01/23/2017   Hypercholesteremia    Hypertension    Internal hemorrhoids    Kidney stones    Myocardial infarction [facility])    Occipital cerebral infarction ([place]) 03/23/2016   Splenomegaly     Past Surgical History:  Procedure Laterality Date   CARDIAC CATHETERIZATION N/A  01/20/2016   Procedure: Left Heart Cath and Coronary Angiography;  Surgeon: Burnell Blanks, MD; LAD 100%, D1 100%, multi-vessel dz 10-30% in other vessels, EF 25-35%, elevated LVEDP   CARDIAC CATHETERIZATION N/A 01/20/2016   Procedure: Coronary Stent Intervention;  Surgeon: Burnell Blanks, MD; PROMUS PREM MR 3.5X16 mm DES LAD, D1 jailed by the stent but was already occluded   CHOLECYSTECTOMY  2009   COLONOSCOPY     EYE SURGERY     Age [place]  2008   SHOULDER SURGERY  1998   Left shoulder fracture-clavicle   WRIST FRACTURE SURGERY  1995   Bilateral    Current Medications: Current Meds  Medication Sig   AMBULATORY NON FORMULARY MEDICATION Medication Name: CBD Oil - daily   aspirin 81 MG chewable tablet Chew 1 tablet (81 mg total) by mouth daily.   carvedilol (COREG) 25 MG tablet TAKE 1 TABLET BY MOUTH 2 (TWO) TIMES DAILY. PLEASE KEEP UPCOMING APPT IN JANUARY 2022   lisinopril (ZESTRIL) 5 MG tablet TAKE 1 TABLET BY MOUTH EVERY DAY   Multiple Vitamin (MULTIVITAMIN WITH MINERALS) TABS tablet Take 1 tablet by mouth daily.   nitroGLYCERIN (NITROSTAT) 0.4 MG SL tablet Place 1 tablet (0.4 mg total) under the tongue every 5 (five) minutes as needed for chest pain.   ondansetron (ZOFRAN) 4 MG tablet Take by mouth  as needed for nausea/vomiting.   oxyCODONE (OXY IR/ROXICODONE) 5 MG immediate release tablet Take by mouth as needed for pain.   rosuvastatin (CRESTOR) 40 MG tablet Take 40 mg by mouth daily.   terbinafine (LAMISIL) 250 MG tablet Take 1 tablet (250 mg total) by mouth daily.   tiZANidine (ZANAFLEX) 2 MG tablet Take 4 mg by mouth 2 (two) times daily.   [DISCONTINUED] clopidogrel (PLAVIX) 75 MG tablet Take 1 tablet (75 mg total) by mouth daily.     Allergies:   Codeine and Lipitor [atorvastatin]   Social History   Socioeconomic History   Marital status: Married    Spouse name: Not on file   Number of children: 1   Years of  education: Not on file   Highest education level: Not on file  Occupational History   Occupation: Research Tech  Tobacco Use   Smoking status: Never   Smokeless tobacco: Never  Vaping Use   Vaping Use: Never used  Substance and Sexual Activity   Alcohol use: Yes    Alcohol/week: 0.0 standard drinks    Comment: 2+ per week   Drug use: No   Sexual activity: Not on file  Other Topics Concern   Not on file  Social History Narrative   Not on file   Social Determinants of Health   Financial Resource Strain: Not on file  Food Insecurity: Not on file  Transportation Needs: Not on file  Physical Activity: Not on file  Stress: Not on file  Social Connections: Not on file    Social: History of playing [place], Born at [facility]  Family History: The patient's family history includes CVA in his brother, brother, brother, and father; Heart disease in his father. There is no history of Colon polyps, Colon cancer, Esophageal cancer, Stomach cancer, Pancreatic cancer, or Liver disease.  ROS:   Please see the history of present illness.     All other systems reviewed and are negative.  EKGs/Labs/Other Studies Reviewed:    The following studies were reviewed today:  EKG:  EKG is  ordered today.  The ekg ordered today demonstrates  03/16/21: SR 1st heart block   Transthoracic Echocardiogram: Date: 04/20/2016 Results: Study Conclusions   - Left ventricle: The cavity size was normal. Wall thickness was    normal. Systolic function was mildly to moderately reduced. The    estimated ejection fraction was in the range of 40% to 45%.    Diffuse hypokinesis. Doppler parameters are consistent with    abnormal left ventricular relaxation (grade 1 diastolic    dysfunction).  - Aortic valve: Trileaflet; mildly thickened, mildly calcified    leaflets. There was trivial regurgitation.  - Mitral valve: There was mild regurgitation.   Carotid Vascular Duplex: Date:  01/23/2016 Results: Bilateral: mild mixed plaque origin ICA. Mild soft plaque proximal  ICA and ECA. Vertebral artery flow is antegrade. 1-39% ICA  plaquing. Left ECA velocities are elevated.   Left/Right Heart Catheterizations: Date: 01/20/2016 Results: Mid RCA lesion, 10 %stenosed. Ost 1st Mrg to 1st Mrg lesion, 20 %stenosed. 2nd Mrg lesion, 20 %stenosed. Mid Cx lesion, 10 %stenosed. Prox Cx to Mid Cx lesion, 10 %stenosed. Ost LM to LM lesion, 30 %stenosed. Ost 1st Diag to 1st Diag lesion, 100 %stenosed. A STENT PROMUS PREM MR 3.5X16 drug eluting stent was successfully placed. Prox LAD lesion, 100 %stenosed. Post intervention, there is a 0% residual stenosis. Mid LAD lesion, 20 %stenosed. There is moderate left ventricular systolic dysfunction. LV  end diastolic pressure is mildly elevated. The left ventricular ejection fraction is 25-35% by visual estimate. There is no mitral valve regurgitation.   1. Acute anterior STEMI secondary to occluded proximal LAD 2. Successful PTCA/DES x 1 proximal to mid LAD 3. Mild disease in the left main, RCA and Circumflex 4. Moderate segmental LV systolic dysfunction   Recommendations: Will continue DAPT for one year with ASA and Brilinta. Will start beta blocker and change statin to Crestor. Will continue Ace-inh. Echo in before discharge.   Recent Labs: 01/26/2021: ALT 23; BUN 8; Creat 0.84; Potassium 4.6; Sodium 139  Recent Lipid Panel    Component Value Date/Time   CHOL 115 04/04/2018 0838   TRIG 103 04/04/2018 0838   HDL 41 04/04/2018 0838   CHOLHDL 2.8 04/04/2018 0838   CHOLHDL 3.6 03/23/2016 1003   VLDL 27 03/23/2016 1003   LDLCALC 53 04/04/2018 0838    Physical Exam:    VS:  BP 122/90   Pulse 68   Ht 5\' 10"  (1.778 m)   Wt 235 lb (106.6 kg)   SpO2 96%   BMI 33.72 kg/m     Wt Readings from Last 3 Encounters:  03/17/21 235 lb (106.6 kg)  06/22/20 234 lb 12.8 oz (106.5 kg)  06/07/19 234 lb (106.1 kg)     GEN:  Well  nourished, well developed in no acute distress HEENT: Notable Frank's Sign NECK: No JVD; No carotid bruits LYMPHATICS: No lymphadenopathy CARDIAC: RRR, no murmurs, rubs, gallops RESPIRATORY:  Clear to auscultation without rales, wheezing or rhonchi  ABDOMEN: Soft, non-tender, non-distended MUSCULOSKELETAL:  No edema; No deformity  SKIN: Warm and dry NEUROLOGIC:  Alert and oriented x 3 PSYCHIATRIC:  Normal affect   ASSESSMENT:    1. Coronary artery disease involving native coronary artery of native heart without angina pectoris   2. Ischemic cardiomyopathy   3. Primary hypertension   4. History of stroke    PLAN:    Preoperative Risk Assessment - The Revised Cardiac Risk Index = 3 CAD, CHF, CVA which equates to 2 ; 11%: moderate estimated risk of perioperative myocardial infarction, pulmonary edema, ventricular fibrillation, cardiac arrest, or complete heart block. - Despite this he is well compensated and appears asymptomatic - DASI score of 29 associated with 6 functional mets - No further cardiac testing is recommended prior to surgery.  - The patient may proceed to surgery at acceptable risk.    CAD and CAS HLD hx of 2017 CVA - LDL at goal 66 on rosuvastatin 40 mg - will continue ASA, stop plavix and can re-discuss restart at 1/23 visit - continue coreg 25 mg - continue lisinopril 5 mg  HFrEF and HTN  Heart Failure mildly reduced Ejection Fraction  - NYHA class I, Stage B, euvolemic, etiology from ischemia and may have recovered - meds as above, may get echo in 1/23 f/u   Has 1/23 follow up with me    Medication Adjustments/Labs and Tests Ordered: Current medicines are reviewed at length with the patient today.  Concerns regarding medicines are outlined above.  Orders Placed This Encounter  Procedures   EKG 12-Lead    No orders of the defined types were placed in this encounter.   Patient Instructions  Medication Instructions:  Your physician has recommended  you make the following change in your medication:  STOP: clopidogrel (Plavix)  *If you need a refill on your cardiac medications before your next appointment, please call your pharmacy*   Lab Work: NONE  If you have labs (blood work) drawn today and your tests are completely normal, you will receive your results only by: [place] (if you have MyChart) OR A paper copy in the mail If you have any lab test that is abnormal or we need to change your treatment, we will call you to review the results.   Testing/Procedures: NONE   Follow-Up: At [facility], you and your health needs are our priority.  As part of our continuing mission to provide you with exceptional heart care, we have created designated Provider Care Teams.  These Care Teams include your primary Cardiologist (physician) and Advanced Practice Providers (APPs -  Physician Assistants and Nurse Practitioners) who all work together to provide you with the care you need, when you need it.  We recommend signing up for the patient portal called "MyChart".  Sign up information is provided on this After Visit Summary.  MyChart is used to connect with patients for Virtual Visits (Telemedicine).  Patients are able to view lab/test results, encounter notes, upcoming appointments, etc.  Non-urgent messages can be sent to your provider as well.   To learn more about what you can do with MyChart, go to [email or web address].    Your next appointment:    The format for your next appointment:   In Person  Provider:   You may see Rudean Haskell, MD or one of the following Advanced Practice Providers on your designated Care Team:   Melina Copa, PA-C Ermalinda Barrios, PA-C   Other Instructions Your physician has cleared you for surgery.     Signed, Werner Lean, MD  03/17/2021 11:08 AM    [place]

## 2021-03-17 ENCOUNTER — Other Ambulatory Visit: Payer: Self-pay

## 2021-03-17 ENCOUNTER — Ambulatory Visit: Payer: PPO | Admitting: Internal Medicine

## 2021-03-17 ENCOUNTER — Encounter: Payer: Self-pay | Admitting: Internal Medicine

## 2021-03-17 VITALS — BP 122/90 | HR 68 | Ht 70.0 in | Wt 235.0 lb

## 2021-03-17 DIAGNOSIS — I255 Ischemic cardiomyopathy: Secondary | ICD-10-CM | POA: Diagnosis not present

## 2021-03-17 DIAGNOSIS — I10 Essential (primary) hypertension: Secondary | ICD-10-CM | POA: Diagnosis not present

## 2021-03-17 DIAGNOSIS — Z8673 Personal history of transient ischemic attack (TIA), and cerebral infarction without residual deficits: Secondary | ICD-10-CM | POA: Diagnosis not present

## 2021-03-17 DIAGNOSIS — M1611 Unilateral primary osteoarthritis, right hip: Secondary | ICD-10-CM | POA: Diagnosis not present

## 2021-03-17 DIAGNOSIS — I2510 Atherosclerotic heart disease of native coronary artery without angina pectoris: Secondary | ICD-10-CM

## 2021-03-17 NOTE — Patient Instructions (Signed)
Medication Instructions:  Your physician has recommended you make the following change in your medication:  STOP: clopidogrel (Plavix)  *If you need a refill on your cardiac medications before your next appointment, please call your pharmacy*   Lab Work: NONE If you have labs (blood work) drawn today and your tests are completely normal, you will receive your results only by: [place] (if you have MyChart) OR A paper copy in the mail If you have any lab test that is abnormal or we need to change your treatment, we will call you to review the results.   Testing/Procedures: NONE   Follow-Up: At [facility], you and your health needs are our priority.  As part of our continuing mission to provide you with exceptional heart care, we have created designated Provider Care Teams.  These Care Teams include your primary Cardiologist (physician) and Advanced Practice Providers (APPs -  Physician Assistants and Nurse Practitioners) who all work together to provide you with the care you need, when you need it.  We recommend signing up for the patient portal called "MyChart".  Sign up information is provided on this After Visit Summary.  MyChart is used to connect with patients for Virtual Visits (Telemedicine).  Patients are able to view lab/test results, encounter notes, upcoming appointments, etc.  Non-urgent messages can be sent to your provider as well.   To learn more about what you can do with MyChart, go to [email or web address].    Your next appointment:    The format for your next appointment:   In Person  Provider:   You may see Rudean Haskell, MD or one of the following Advanced Practice Providers on your designated Care Team:   Melina Copa, PA-C Ermalinda Barrios, PA-C   Other Instructions Your physician has cleared you for surgery.

## 2021-03-24 ENCOUNTER — Ambulatory Visit: Payer: PPO | Admitting: Physician Assistant

## 2021-03-26 DIAGNOSIS — Z96641 Presence of right artificial hip joint: Secondary | ICD-10-CM | POA: Diagnosis not present

## 2021-03-26 DIAGNOSIS — M1611 Unilateral primary osteoarthritis, right hip: Secondary | ICD-10-CM | POA: Diagnosis not present

## 2021-03-29 DIAGNOSIS — Z96641 Presence of right artificial hip joint: Secondary | ICD-10-CM | POA: Diagnosis not present

## 2021-03-29 DIAGNOSIS — M25651 Stiffness of right hip, not elsewhere classified: Secondary | ICD-10-CM | POA: Diagnosis not present

## 2021-03-29 DIAGNOSIS — M6281 Muscle weakness (generalized): Secondary | ICD-10-CM | POA: Diagnosis not present

## 2021-03-30 DIAGNOSIS — Z96641 Presence of right artificial hip joint: Secondary | ICD-10-CM | POA: Diagnosis not present

## 2021-03-30 DIAGNOSIS — M6281 Muscle weakness (generalized): Secondary | ICD-10-CM | POA: Diagnosis not present

## 2021-03-30 DIAGNOSIS — M25651 Stiffness of right hip, not elsewhere classified: Secondary | ICD-10-CM | POA: Diagnosis not present

## 2021-04-06 DIAGNOSIS — Z96641 Presence of right artificial hip joint: Secondary | ICD-10-CM | POA: Diagnosis not present

## 2021-04-06 DIAGNOSIS — Z471 Aftercare following joint replacement surgery: Secondary | ICD-10-CM | POA: Diagnosis not present

## 2021-04-07 DIAGNOSIS — Z96641 Presence of right artificial hip joint: Secondary | ICD-10-CM | POA: Diagnosis not present

## 2021-04-07 DIAGNOSIS — M6281 Muscle weakness (generalized): Secondary | ICD-10-CM | POA: Diagnosis not present

## 2021-04-07 DIAGNOSIS — M25651 Stiffness of right hip, not elsewhere classified: Secondary | ICD-10-CM | POA: Diagnosis not present

## 2021-04-09 DIAGNOSIS — Z96641 Presence of right artificial hip joint: Secondary | ICD-10-CM | POA: Diagnosis not present

## 2021-04-09 DIAGNOSIS — M6281 Muscle weakness (generalized): Secondary | ICD-10-CM | POA: Diagnosis not present

## 2021-04-09 DIAGNOSIS — M25651 Stiffness of right hip, not elsewhere classified: Secondary | ICD-10-CM | POA: Diagnosis not present

## 2021-04-12 ENCOUNTER — Inpatient Hospital Stay (HOSPITAL_COMMUNITY)
Admission: EM | Admit: 2021-04-12 | Discharge: 2021-04-20 | DRG: 216 | Disposition: A | Discharge status: Home / Self Care | Payer: PPO | Attending: Thoracic Surgery (Cardiothoracic Vascular Surgery) | Admitting: Thoracic Surgery (Cardiothoracic Vascular Surgery)

## 2021-04-12 ENCOUNTER — Emergency Department (HOSPITAL_COMMUNITY): Payer: PPO

## 2021-04-12 ENCOUNTER — Other Ambulatory Visit: Payer: Self-pay

## 2021-04-12 ENCOUNTER — Encounter (HOSPITAL_COMMUNITY): Payer: Self-pay

## 2021-04-12 DIAGNOSIS — Z823 Family history of stroke: Secondary | ICD-10-CM

## 2021-04-12 DIAGNOSIS — I110 Hypertensive heart disease with heart failure: Secondary | ICD-10-CM | POA: Diagnosis not present

## 2021-04-12 DIAGNOSIS — Z955 Presence of coronary angioplasty implant and graft: Secondary | ICD-10-CM

## 2021-04-12 DIAGNOSIS — I25110 Atherosclerotic heart disease of native coronary artery with unstable angina pectoris: Secondary | ICD-10-CM | POA: Diagnosis not present

## 2021-04-12 DIAGNOSIS — E8770 Fluid overload, unspecified: Secondary | ICD-10-CM | POA: Diagnosis not present

## 2021-04-12 DIAGNOSIS — I255 Ischemic cardiomyopathy: Secondary | ICD-10-CM | POA: Diagnosis not present

## 2021-04-12 DIAGNOSIS — I200 Unstable angina: Secondary | ICD-10-CM | POA: Diagnosis not present

## 2021-04-12 DIAGNOSIS — E7800 Pure hypercholesterolemia, unspecified: Secondary | ICD-10-CM | POA: Diagnosis present

## 2021-04-12 DIAGNOSIS — R079 Chest pain, unspecified: Secondary | ICD-10-CM | POA: Diagnosis not present

## 2021-04-12 DIAGNOSIS — Z01818 Encounter for other preprocedural examination: Secondary | ICD-10-CM

## 2021-04-12 DIAGNOSIS — Z79899 Other long term (current) drug therapy: Secondary | ICD-10-CM | POA: Diagnosis not present

## 2021-04-12 DIAGNOSIS — Z7982 Long term (current) use of aspirin: Secondary | ICD-10-CM | POA: Diagnosis not present

## 2021-04-12 DIAGNOSIS — D6959 Other secondary thrombocytopenia: Secondary | ICD-10-CM | POA: Diagnosis not present

## 2021-04-12 DIAGNOSIS — J9600 Acute respiratory failure, unspecified whether with hypoxia or hypercapnia: Secondary | ICD-10-CM | POA: Diagnosis not present

## 2021-04-12 DIAGNOSIS — D62 Acute posthemorrhagic anemia: Secondary | ICD-10-CM | POA: Diagnosis not present

## 2021-04-12 DIAGNOSIS — Z953 Presence of xenogenic heart valve: Secondary | ICD-10-CM

## 2021-04-12 DIAGNOSIS — I351 Nonrheumatic aortic (valve) insufficiency: Secondary | ICD-10-CM | POA: Diagnosis not present

## 2021-04-12 DIAGNOSIS — Z9049 Acquired absence of other specified parts of digestive tract: Secondary | ICD-10-CM | POA: Diagnosis not present

## 2021-04-12 DIAGNOSIS — J9601 Acute respiratory failure with hypoxia: Secondary | ICD-10-CM | POA: Diagnosis not present

## 2021-04-12 DIAGNOSIS — I440 Atrioventricular block, first degree: Secondary | ICD-10-CM | POA: Diagnosis present

## 2021-04-12 DIAGNOSIS — K648 Other hemorrhoids: Secondary | ICD-10-CM | POA: Diagnosis present

## 2021-04-12 DIAGNOSIS — Z96641 Presence of right artificial hip joint: Secondary | ICD-10-CM | POA: Diagnosis present

## 2021-04-12 DIAGNOSIS — Z8673 Personal history of transient ischemic attack (TIA), and cerebral infarction without residual deficits: Secondary | ICD-10-CM | POA: Diagnosis not present

## 2021-04-12 DIAGNOSIS — J939 Pneumothorax, unspecified: Secondary | ICD-10-CM

## 2021-04-12 DIAGNOSIS — Z20822 Contact with and (suspected) exposure to covid-19: Secondary | ICD-10-CM | POA: Diagnosis present

## 2021-04-12 DIAGNOSIS — Z8249 Family history of ischemic heart disease and other diseases of the circulatory system: Secondary | ICD-10-CM | POA: Diagnosis not present

## 2021-04-12 DIAGNOSIS — I719 Aortic aneurysm of unspecified site, without rupture: Secondary | ICD-10-CM | POA: Diagnosis not present

## 2021-04-12 DIAGNOSIS — Z951 Presence of aortocoronary bypass graft: Secondary | ICD-10-CM

## 2021-04-12 DIAGNOSIS — I2510 Atherosclerotic heart disease of native coronary artery without angina pectoris: Secondary | ICD-10-CM

## 2021-04-12 DIAGNOSIS — Z01810 Encounter for preprocedural cardiovascular examination: Secondary | ICD-10-CM | POA: Diagnosis not present

## 2021-04-12 DIAGNOSIS — R0789 Other chest pain: Secondary | ICD-10-CM | POA: Diagnosis not present

## 2021-04-12 DIAGNOSIS — I252 Old myocardial infarction: Secondary | ICD-10-CM | POA: Diagnosis not present

## 2021-04-12 DIAGNOSIS — I5042 Chronic combined systolic (congestive) and diastolic (congestive) heart failure: Secondary | ICD-10-CM | POA: Diagnosis present

## 2021-04-12 DIAGNOSIS — I10 Essential (primary) hypertension: Secondary | ICD-10-CM | POA: Diagnosis not present

## 2021-04-12 DIAGNOSIS — J90 Pleural effusion, not elsewhere classified: Secondary | ICD-10-CM

## 2021-04-12 LAB — CBC
HCT: 37.4 % — ABNORMAL LOW (ref 39.0–52.0)
Hemoglobin: 13 g/dL (ref 13.0–17.0)
MCH: 33.2 pg (ref 26.0–34.0)
MCHC: 34.8 g/dL (ref 30.0–36.0)
MCV: 95.7 fL (ref 80.0–100.0)
Platelets: 264 10*3/uL (ref 150–400)
RBC: 3.91 MIL/uL — ABNORMAL LOW (ref 4.22–5.81)
RDW: 12.5 % (ref 11.5–15.5)
WBC: 7.5 10*3/uL (ref 4.0–10.5)
nRBC: 0 % (ref 0.0–0.2)

## 2021-04-12 LAB — BASIC METABOLIC PANEL
Anion gap: 9 (ref 5–15)
BUN: 9 mg/dL (ref 8–23)
CO2: 25 mmol/L (ref 22–32)
Calcium: 8.9 mg/dL (ref 8.9–10.3)
Chloride: 104 mmol/L (ref 98–111)
Creatinine, Ser: 1.18 mg/dL (ref 0.61–1.24)
GFR, Estimated: >60 mL/min (ref >60)
Glucose, Bld: 110 mg/dL — ABNORMAL HIGH (ref 70–99)
Potassium: 3.8 mmol/L (ref 3.5–5.1)
Sodium: 138 mmol/L (ref 135–145)

## 2021-04-12 LAB — RESP PANEL BY RT-PCR (FLU A&B, COVID) ARPGX2
Influenza A by PCR: NEGATIVE
Influenza B by PCR: NEGATIVE
SARS Coronavirus 2 by RT PCR: NEGATIVE

## 2021-04-12 LAB — TROPONIN I (HIGH SENSITIVITY)
Troponin I (High Sensitivity): 10 ng/L (ref <18)
Troponin I (High Sensitivity): 9 ng/L (ref <18)

## 2021-04-12 LAB — HEMOGLOBIN A1C
Hgb A1c MFr Bld: 4.9 % (ref 4.8–5.6)
Mean Plasma Glucose: 93.93 mg/dL

## 2021-04-12 MED ORDER — NITROGLYCERIN 0.4 MG SL SUBL
0.4000 mg | SUBLINGUAL_TABLET | SUBLINGUAL | Status: DC | PRN
  Administered 2021-04-12 – 2021-04-14 (×7): 0.4 mg via SUBLINGUAL
  Filled 2021-04-12 (×3): qty 1

## 2021-04-12 MED ORDER — ACETAMINOPHEN 325 MG PO TABS: 650.0000 mg | ORAL_TABLET | ORAL | Status: DC | PRN

## 2021-04-12 MED ORDER — HEPARIN SODIUM (PORCINE) 5000 UNIT/ML IJ SOLN
5000.0000 [IU] | Freq: Three times a day (TID) | INTRAMUSCULAR | Status: DC
  Administered 2021-04-13: 5000 [IU] via SUBCUTANEOUS
  Filled 2021-04-12: qty 1

## 2021-04-12 MED ORDER — ASPIRIN EC 81 MG PO TBEC
81.0000 mg | DELAYED_RELEASE_TABLET | Freq: Every day | ORAL | Status: DC
  Administered 2021-04-13: 81 mg via ORAL
  Filled 2021-04-12: qty 1

## 2021-04-12 MED ORDER — ROSUVASTATIN CALCIUM 20 MG PO TABS
40.0000 mg | ORAL_TABLET | Freq: Every day | ORAL | Status: DC
  Administered 2021-04-13 – 2021-04-20 (×7): 40 mg via ORAL
  Filled 2021-04-12 (×8): qty 2

## 2021-04-12 MED ORDER — CARVEDILOL 25 MG PO TABS
25.0000 mg | ORAL_TABLET | Freq: Two times a day (BID) | ORAL | Status: DC
  Administered 2021-04-12 – 2021-04-15 (×5): 25 mg via ORAL
  Filled 2021-04-12: qty 1
  Filled 2021-04-12: qty 2
  Filled 2021-04-12 (×4): qty 1
  Filled 2021-04-12: qty 2

## 2021-04-12 MED ORDER — ONDANSETRON HCL 4 MG/2ML IJ SOLN: 4.0000 mg | Freq: Four times a day (QID) | INTRAMUSCULAR | Status: DC | PRN

## 2021-04-12 NOTE — ED Notes (Signed)
Cardiology at bedside.

## 2021-04-12 NOTE — H&P (View-Only) (Signed)
Progress Note  Patient Name: Dwayne [name] Date of Encounter: 04/13/2021  [facility] Cardiologist: Mertie Moores, MD   Subjective   Had episode of substernal chest tightness when ambulating to the bathroom. ECG with NSR with nonspecific ST-T wave changes. No STD/STE. Took SL-NTG with improvement. Now with very mild symptoms.   Plan for cath today  Inpatient Medications    Scheduled Meds:  aspirin  81 mg Oral Pre-Cath   aspirin EC  81 mg Oral Daily   carvedilol  25 mg Oral BID WC   heparin  5,000 Units Subcutaneous Q8H   rosuvastatin  40 mg Oral Daily   Continuous Infusions:  sodium chloride     Followed by   sodium chloride     PRN Meds: acetaminophen, nitroGLYCERIN, ondansetron (ZOFRAN) IV   Vital Signs    Vitals:   04/13/21 0100 04/13/21 0300 04/13/21 0357 04/13/21 0400  BP: 128/80 111/69 (!) 148/95   Pulse:   66   Resp:   18   Temp:   98.3 F (36.8 C)   TempSrc:   Oral   SpO2:   98%   Weight:   103.6 kg 103.6 kg  Height:    5\' 10"  (1.778 m)    Intake/Output Summary (Last 24 hours) at 04/13/2021 0516 Last data filed at 04/12/2021 2200 Gross per 24 hour  Intake 240 ml  Output --  Net 240 ml   Last 3 Weights 04/13/2021 04/13/2021 03/17/2021  Weight (lbs) 228 lb 6.3 oz 228 lb 6.3 oz 235 lb  Weight (kg) 103.6 kg 103.6 kg 106.595 kg      Telemetry    NSR - Personally Reviewed  ECG    NSR with nonspecific ST-T wave changes - Personally Reviewed  Physical Exam   GEN: No acute distress.   Neck: No JVD Cardiac: RRR, soft systolic murmur, no rubs  Respiratory: Clear to auscultation bilaterally. GI: Soft, nontender, non-distended  MS: No edema; No deformity. Neuro:  Nonfocal  Psych: Normal affect   Labs    High Sensitivity Troponin:   Recent Labs  Lab 04/12/21 1155 04/12/21 1404  TROPONINIHS 10 9     Chemistry Recent Labs  Lab 04/12/21 1155 04/13/21 0342  NA 138 136  K 3.8 3.5  CL 104 102  CO2 25 25  GLUCOSE 110* 101*   BUN 9 10  CREATININE 1.18 0.86  CALCIUM 8.9 8.8*  GFRNONAA >60 >60  ANIONGAP 9 9    Lipids  Recent Labs  Lab 04/13/21 0342  CHOL 109  TRIG 119  HDL 32*  LDLCALC 53  CHOLHDL 3.4    Hematology Recent Labs  Lab 04/12/21 1155 04/13/21 0342  WBC 7.5 7.6  RBC 3.91* 3.58*  HGB 13.0 12.0*  HCT 37.4* 34.2*  MCV 95.7 95.5  MCH 33.2 33.5  MCHC 34.8 35.1  RDW 12.5 12.5  PLT 264 205   Thyroid No results for input(s): TSH, FREET4 in the last 168 hours.  BNPNo results for input(s): BNP, PROBNP in the last 168 hours.  DDimer No results for input(s): DDIMER in the last 168 hours.   Radiology    DG Chest 2 View  Result Date: 04/12/2021 CLINICAL DATA:  Chest pain EXAM: CHEST - 2 VIEW COMPARISON:  None FINDINGS: Heart size and mediastinal contours appear normal. No pleural effusion or edema. No airspace densities identified. Postsurgical changes with resection of the distal left clavicle noted. Mild degenerative changes within the thoracic spine. IMPRESSION: No active cardiopulmonary  abnormalities. Electronically Signed   By: Kerby Moors M.D.   On: 04/12/2021 13:05    Cardiac Studies   Transthoracic Echocardiogram: Date: 04/20/2016 Results: Study Conclusions   - Left ventricle: The cavity size was normal. Wall thickness was    normal. Systolic function was mildly to moderately reduced. The    estimated ejection fraction was in the range of 40% to 45%.    Diffuse hypokinesis. Doppler parameters are consistent with    abnormal left ventricular relaxation (grade 1 diastolic    dysfunction).  - Aortic valve: Trileaflet; mildly thickened, mildly calcified    leaflets. There was trivial regurgitation.  - Mitral valve: There was mild regurgitation.    Carotid Vascular Duplex: Date: 01/23/2016 Results: Bilateral: mild mixed plaque origin ICA. Mild soft plaque proximal  ICA and ECA. Vertebral artery flow is antegrade. 1-39% ICA  plaquing. Left ECA velocities are elevated.     Left/Right Heart Catheterizations: Date: 01/20/2016 Results: Mid RCA lesion, 10 %stenosed. Ost 1st Mrg to 1st Mrg lesion, 20 %stenosed. 2nd Mrg lesion, 20 %stenosed. Mid Cx lesion, 10 %stenosed. Prox Cx to Mid Cx lesion, 10 %stenosed. Ost LM to LM lesion, 30 %stenosed. Ost 1st Diag to 1st Diag lesion, 100 %stenosed. A STENT PROMUS PREM MR 3.5X16 drug eluting stent was successfully placed. Prox LAD lesion, 100 %stenosed. Post intervention, there is a 0% residual stenosis. Mid LAD lesion, 20 %stenosed. There is moderate left ventricular systolic dysfunction. LV end diastolic pressure is mildly elevated. The left ventricular ejection fraction is 25-35% by visual estimate. There is no mitral valve regurgitation.   1. Acute anterior STEMI secondary to occluded proximal LAD 2. Successful PTCA/DES x 1 proximal to mid LAD 3. Mild disease in the left main, RCA and Circumflex 4. Moderate segmental LV systolic dysfunction   Recommendations: Will continue DAPT for one year with ASA and Brilinta. Will start beta blocker and change statin to Crestor. Will continue Ace-inh. Echo in before discharge.   Patient Profile     65 y.o. male with history of LAD STEMI s/p PCI in 2017, ischemic cardiomyopathy with LVEF 25-30% which improved to 40-45%, prior CVA with mild carotid disease, HTN and HLD who presented with chest pain with concern for UA.   Assessment & Plan    #Chest Pain/Concern for UA: #Known CAD with history of STEMI s/p LAD PCI: Patient presents with exertional substernal chest pressure that has been increasing in frequency over the past month in the setting of known CAD. Specifically, patient suffered LAD STEMI in 2017 s/p PCI with mild residual disease in LM, RCA, Lcx. Given exertional symptoms with known CAD, plan for cath. -NPO for cath today -Start heparin now given recurrent substernal chest pain this AM with minimal activity -Follow-up TTE -Continue ASA 81mg  daily  -Continue  coreg 25mg  BID -Will resume ACE post-cath -Continue crestor 40mg  daily -Nitro prn   #Ischemic CM with LVEF 40-45%: Patient with history of ischemic CM with initial EF 25-30% which improved to 40-45% on TTE in 2017. Currently compensated and euvolemic on exam.  -Repeat TTE as above -Continue home coreg 25mg  BID -Will resume home ACE post-cath (can consider entresto in future as well pending EF) -Will add spiro/SGLT2i as tolerated   #HLD: LDL well controlled at 53, TG 119, HDL 32 -Continue home crestor 40mg  daily   #HTN: -Continue home coreg 25mg  BID -Will resume home ACE post-cath  INFORMED CONSENT: I have reviewed the risks, indications, and alternatives to cardiac catheterization, possible angioplasty, and  stenting with the patient. Risks include but are not limited to bleeding, infection, vascular injury, stroke, myocardial infection, arrhythmia, kidney injury, radiation-related injury in the case of prolonged fluoroscopy use, emergency cardiac surgery, and death. The patient understands the risks of serious complication is 1-2 in 0149 with diagnostic cardiac cath and 1-2% or less with angioplasty/stenting.    For questions or updates, please contact [place] Please consult [email or web address] for contact info under        Signed, Freada Bergeron, MD  04/13/2021, 5:16 AM

## 2021-04-12 NOTE — Plan of Care (Signed)
  Problem: Activity: Goal: Risk for activity intolerance will decrease Outcome: Progressing   Problem: Coping: Goal: Level of anxiety will decrease Outcome: Progressing   Problem: Pain Managment: Goal: General experience of comfort will improve Outcome: Progressing   Problem: Safety: Goal: Ability to remain free from injury will improve Outcome: Progressing   

## 2021-04-12 NOTE — Progress Notes (Signed)
Patient having chest pain and requests nitro. He says it helped last time.  2058 CP 8/10 BP 160/97 HR 82 1st nitro given  2103 CP 4/10 BP 131/93 HR 86 2nd nitro given  2108 CP 1/10 (tightness, no pain) BP 121/91 HR 81 Patient does not want last nitro.  Provider paged.

## 2021-04-12 NOTE — Progress Notes (Signed)
On call for Cardiology paged to make aware of pts. CP. No new orders at this time. RN to make Cardiology aware of new episode of CP.

## 2021-04-12 NOTE — H&P (Signed)
Cardiology H and P:   Patient ID: Dwayne [name] MRN: [id number]; DOB: 11-20-1955  Admit date: 04/12/2021 Date of Consult: 04/12/2021  PCP:  Gaynelle Arabian, MD   [facility] Providers Cardiologist:  Mertie Moores, MD        Patient Profile:   Dwayne [name] is a 65 y.o. male with a hx of LAD STEMI s/p PCI in 2017, ischemic cardiomyopathy with LVEF 25-30% which improved to 40-45%, prior CVA with mild carotid disease, HTN and HLD who is being seen 04/12/2021 for the evaluation of chest pain at the request of Dr. Langston Masker.  History of Present Illness:   Dwayne [name] is a 65 year old male with history of known CAD with prior STEMI of LAD in 2017 with ischemic CM with initial EF 25-30% that improved to 40-45% following PCI who presents to ED with chest pain. Last saw Dr. Gasper Sells on 03/17/21 where he was doing well and compensated from a HF standpoint with no ischemic symptoms.  Today, the patient states that about 1 month ago he experienced substernal chest pressure while packing his car in [place]. He sat down to rest and took SL NTG with resolution of his symptoms after about 58min. Since that time, he has had intermittent substernal chest tightness that resolved with rest. None of the episodes were as severe and therefore he did not go to the hospital at that time. This morning, however, he was taking out the trash when he developed a more significant episode of substernal chest tightness. He took 1 SL-NTG with improvement and decided to come to the ER for further evaluation.   In the ER, trop 10>9. ECG with NSR without ischemic changes.   Past Medical History:  Diagnosis Date   Acute ST elevation myocardial infarction (STEMI) involving left anterior descending (LAD) coronary artery (HCC)    Adenomatous colon polyp 2000   Arthritis    Coronary artery disease involving native coronary artery of native heart without angina pectoris 03/23/2016   Diverticulosis    Erectile  dysfunction    History of stroke 01/23/2017   Hypercholesteremia    Hypertension    Internal hemorrhoids    Kidney stones    Myocardial infarction [facility])    Occipital cerebral infarction ([place]) 03/23/2016   Splenomegaly     Past Surgical History:  Procedure Laterality Date   CARDIAC CATHETERIZATION N/A 01/20/2016   Procedure: Left Heart Cath and Coronary Angiography;  Surgeon: Burnell Blanks, MD; LAD 100%, D1 100%, multi-vessel dz 10-30% in other vessels, EF 25-35%, elevated LVEDP   CARDIAC CATHETERIZATION N/A 01/20/2016   Procedure: Coronary Stent Intervention;  Surgeon: Burnell Blanks, MD; PROMUS PREM MR 3.5X16 mm DES LAD, D1 jailed by the stent but was already occluded   CHOLECYSTECTOMY  2009   COLONOSCOPY     EYE SURGERY     [place]  2008   SHOULDER SURGERY  1998   Left shoulder fracture-clavicle   WRIST FRACTURE SURGERY  1995   Bilateral     Home Medications:  Prior to Admission medications   Medication Sig Start Date End Date Taking? Authorizing Provider  AMBULATORY NON FORMULARY MEDICATION Apply 1 application topically daily as needed (pain). Medication Name: CBD Oil - daily   Yes [provider]  aspirin EC 81 MG tablet Take by mouth. 03/17/21  Yes [provider]  carvedilol (COREG) 25 MG tablet TAKE 1 TABLET BY MOUTH 2 (  TWO) TIMES DAILY. PLEASE KEEP UPCOMING APPT IN [facility] 2022 07/28/20  Yes Nahser, Wonda Cheng, MD  lisinopril (ZESTRIL) 5 MG tablet TAKE 1 TABLET BY MOUTH EVERY DAY 07/28/20  Yes Nahser, Wonda Cheng, MD  Multiple Vitamin (MULTIVITAMIN WITH MINERALS) TABS tablet Take 1 tablet by mouth daily.   Yes [provider]  nitroGLYCERIN (NITROSTAT) 0.4 MG SL tablet Place 1 tablet (0.4 mg total) under the tongue every 5 (five) minutes as needed for chest pain. 01/22/16  Yes Barrett, Evelene Croon, PA-C  ondansetron (ZOFRAN) 4 MG tablet Take 4 mg by mouth every 8 (eight) hours as needed for  nausea/vomiting, nausea or vomiting. 03/17/21  Yes [provider]  oxyCODONE (OXY IR/ROXICODONE) 5 MG immediate release tablet Take 5 mg by mouth every 6 (six) hours as needed for pain or moderate pain. 03/17/21  Yes [provider]  rosuvastatin (CRESTOR) 40 MG tablet Take 40 mg by mouth daily.   Yes [provider]  terbinafine (LAMISIL) 250 MG tablet Take 1 tablet (250 mg total) by mouth daily. Patient taking differently: Take 250 mg by mouth every evening. 01/26/21  Yes Hyatt, Max T, DPM  tiZANidine (ZANAFLEX) 2 MG tablet Take 4 mg by mouth every 8 (eight) hours as needed for muscle spasms. 03/17/21  Yes [provider]  aspirin 81 MG chewable tablet Chew 1 tablet (81 mg total) by mouth daily. Patient not taking: No sig reported 01/22/16   Barrett, Evelene Croon, PA-C    Inpatient Medications: Scheduled Meds:  Continuous Infusions:  PRN Meds: nitroGLYCERIN  Allergies:    Allergies  Allergen Reactions   Codeine Nausea Only   Lipitor [Atorvastatin] Other (See Comments)    Myalgia    Social History:   Social History   Socioeconomic History   Marital status: Married    Spouse name: Not on file   Number of children: 1   Years of education: Not on file   Highest education level: Not on file  Occupational History   Occupation: Research Tech  Tobacco Use   Smoking status: Never   Smokeless tobacco: Never  Vaping Use   Vaping Use: Never used  Substance and Sexual Activity   Alcohol use: Yes    Alcohol/week: 0.0 standard drinks    Comment: 2+ per week   Drug use: No   Sexual activity: Not on file  Other Topics Concern   Not on file  Social History Narrative   Not on file   Social Determinants of Health   Financial Resource Strain: Not on file  Food Insecurity: Not on file  Transportation Needs: Not on file  Physical Activity: Not on file  Stress: Not on file  Social Connections: Not on file  Intimate Partner Violence: Not on file     Family History:    Family History  Problem Relation Age of Onset   Heart disease Father    CVA Father    CVA Brother    CVA Brother    CVA Brother    Colon polyps Neg Hx    Colon cancer Neg Hx    Esophageal cancer Neg Hx    Stomach cancer Neg Hx    Pancreatic cancer Neg Hx    Liver disease Neg Hx      ROS:  Please see the history of present illness.  Review of Systems  Constitutional:  Negative for chills, fever and malaise/fatigue.  Eyes:  Negative for blurred vision.  Respiratory:  Negative for shortness of breath.  Cardiovascular:  Positive for chest pain. Negative for palpitations, orthopnea, claudication, leg swelling and PND.  Gastrointestinal:  Positive for nausea.  Genitourinary:  Negative for flank pain.  Musculoskeletal:  Positive for joint pain. Negative for falls.  Neurological:  Negative for loss of consciousness.   All other ROS reviewed and negative.     Physical Exam/Data:   Vitals:   04/12/21 1530 04/12/21 1600 04/12/21 1630 04/12/21 1638  BP: (!) 164/96 126/87 127/82 117/84  Pulse: 73 70 63 79  Resp: 15 17 15 16   Temp:      TempSrc:      SpO2: 98% 97% 98% 97%   No intake or output data in the 24 hours ending 04/12/21 1729 Last 3 Weights 03/17/2021 06/22/2020 06/07/2019  Weight (lbs) 235 lb 234 lb 12.8 oz 234 lb  Weight (kg) 106.595 kg 106.505 kg 106.142 kg     There is no height or weight on file to calculate BMI.  General:  Well nourished, well developed, in no acute distress HEENT: normal Neck: no JVD Vascular: No carotid bruits; Distal pulses 2+ bilaterally Cardiac:  normal S1, S2; RRR; no murmur  Lungs:  clear to auscultation bilaterally, no wheezing, rhonchi or rales  Abd: soft, nontender, no hepatomegaly  Ext: no edema Musculoskeletal:  No deformities, BUE and BLE strength normal and equal Skin: warm and dry  Neuro:  CNs 2-12 intact, no focal abnormalities noted Psych:  Normal affect   EKG:  The EKG was personally reviewed and  demonstrates:  NSR with no ischemic changes Telemetry:  Telemetry was personally reviewed and demonstrates:  NSR with occasional PVCs  Relevant CV Studies:   Transthoracic Echocardiogram: Date: 04/20/2016 Results: Study Conclusions   - Left ventricle: The cavity size was normal. Wall thickness was    normal. Systolic function was mildly to moderately reduced. The    estimated ejection fraction was in the range of 40% to 45%.    Diffuse hypokinesis. Doppler parameters are consistent with    abnormal left ventricular relaxation (grade 1 diastolic    dysfunction).  - Aortic valve: Trileaflet; mildly thickened, mildly calcified    leaflets. There was trivial regurgitation.  - Mitral valve: There was mild regurgitation.    Carotid Vascular Duplex: Date: 01/23/2016 Results: Bilateral: mild mixed plaque origin ICA. Mild soft plaque proximal  ICA and ECA. Vertebral artery flow is antegrade. 1-39% ICA  plaquing. Left ECA velocities are elevated.    Left/Right Heart Catheterizations: Date: 01/20/2016 Results: Mid RCA lesion, 10 %stenosed. Ost 1st Mrg to 1st Mrg lesion, 20 %stenosed. 2nd Mrg lesion, 20 %stenosed. Mid Cx lesion, 10 %stenosed. Prox Cx to Mid Cx lesion, 10 %stenosed. Ost LM to LM lesion, 30 %stenosed. Ost 1st Diag to 1st Diag lesion, 100 %stenosed. A STENT PROMUS PREM MR 3.5X16 drug eluting stent was successfully placed. Prox LAD lesion, 100 %stenosed. Post intervention, there is a 0% residual stenosis. Mid LAD lesion, 20 %stenosed. There is moderate left ventricular systolic dysfunction. LV end diastolic pressure is mildly elevated. The left ventricular ejection fraction is 25-35% by visual estimate. There is no mitral valve regurgitation.   1. Acute anterior STEMI secondary to occluded proximal LAD 2. Successful PTCA/DES x 1 proximal to mid LAD 3. Mild disease in the left main, RCA and Circumflex 4. Moderate segmental LV systolic dysfunction   Recommendations:  Will continue DAPT for one year with ASA and Brilinta. Will start beta blocker and change statin to Crestor. Will continue Ace-inh. Echo in before discharge.  Laboratory Data:  High Sensitivity Troponin:   Recent Labs  Lab 04/12/21 1155 04/12/21 1404  TROPONINIHS 10 9     Chemistry Recent Labs  Lab 04/12/21 1155  NA 138  K 3.8  CL 104  CO2 25  GLUCOSE 110*  BUN 9  CREATININE 1.18  CALCIUM 8.9  GFRNONAA >60  ANIONGAP 9    No results for input(s): PROT, ALBUMIN, AST, ALT, ALKPHOS, BILITOT in the last 168 hours. Lipids No results for input(s): CHOL, TRIG, HDL, LABVLDL, LDLCALC, CHOLHDL in the last 168 hours.  Hematology Recent Labs  Lab 04/12/21 1155  WBC 7.5  RBC 3.91*  HGB 13.0  HCT 37.4*  MCV 95.7  MCH 33.2  MCHC 34.8  RDW 12.5  PLT 264   Thyroid No results for input(s): TSH, FREET4 in the last 168 hours.  BNPNo results for input(s): BNP, PROBNP in the last 168 hours.  DDimer No results for input(s): DDIMER in the last 168 hours.   Radiology/Studies:  DG Chest 2 View  Result Date: 04/12/2021 CLINICAL DATA:  Chest pain EXAM: CHEST - 2 VIEW COMPARISON:  None FINDINGS: Heart size and mediastinal contours appear normal. No pleural effusion or edema. No airspace densities identified. Postsurgical changes with resection of the distal left clavicle noted. Mild degenerative changes within the thoracic spine. IMPRESSION: No active cardiopulmonary abnormalities. Electronically Signed   By: Kerby Moors M.D.   On: 04/12/2021 13:05     Assessment and Plan:   #Chest Pain: #Known CAD with history of STEMI s/p LAD PCI: Patient presents with exertional substernal chest pressure that has been increasing in frequency over the past month in the setting of known CAD. Specifically, patient suffered LAD STEMI in 2017 s/p PCI with mild residual disease in LM, RCA, Lcx. Given exertional symptoms with known CAD, will plan for cath tomorrow. -NPO for cath tomorrow -No heparin  gtt for now as patient is chest pain free with normal trop and no significant ECG changes; will add if changes -Check TTE -S/p ASA 324mg  today; start ASA 81mg  daily tomorrow -Resume home coreg 25mg  BID -Will resume ACE post-cath -Continue crestor 40mg  daily -Nitro prn  #Ischemic CM with LVEF 40-45%: Patient with history of ischemic CM with initial EF 25-30% which improved to 40-45% on TTE in 2017. Currently compensated and euvolemic on exam.  -Repeat TTE as above -Continue home coreg 25mg  BID -Will resume home ACE post-cath (can consider entresto in future as well pending EF) -Will add spiro/SGLT2i as tolerated  #HLD: -Continue home crestor 40mg  daily -Repeat lipid profile  #HTN: -Continue home coreg 25mg  BID -Will resume home ACE post-cath  INFORMED CONSENT: I have reviewed the risks, indications, and alternatives to cardiac catheterization, possible angioplasty, and stenting with the patient. Risks include but are not limited to bleeding, infection, vascular injury, stroke, myocardial infection, arrhythmia, kidney injury, radiation-related injury in the case of prolonged fluoroscopy use, emergency cardiac surgery, and death. The patient understands the risks of serious complication is 1-2 in 5956 with diagnostic cardiac cath and 1-2% or less with angioplasty/stenting.     Risk Assessment/Risk Scores:     TIMI Risk Score for Unstable Angina or Non-ST Elevation MI:   The patient's TIMI risk score is 5, which indicates a 26% risk of all cause mortality, new or recurrent myocardial infarction or need for urgent revascularization in the next 14 days.  New York Heart Association (NYHA) Functional Class NYHA Class II        For questions  or updates, please contact [place] Please consult [email or web address] for contact info under    Signed, Freada Bergeron, MD  04/12/2021 5:29 PM

## 2021-04-12 NOTE — ED Notes (Signed)
L sided chest tightness that started this AM with activity. Pt reports some associated nausea. Denies diaphoresis, ShOB. Was relieved with one nitro and is exacerbated by activity.

## 2021-04-12 NOTE — Progress Notes (Signed)
Progress Note  Patient Name: Dwayne [name] Date of Encounter: 04/13/2021  [facility] Cardiologist: Mertie Moores, MD   Subjective   Had episode of substernal chest tightness when ambulating to the bathroom. ECG with NSR with nonspecific ST-T wave changes. No STD/STE. Took SL-NTG with improvement. Now with very mild symptoms.   Plan for cath today  Inpatient Medications    Scheduled Meds:  aspirin  81 mg Oral Pre-Cath   aspirin EC  81 mg Oral Daily   carvedilol  25 mg Oral BID WC   heparin  5,000 Units Subcutaneous Q8H   rosuvastatin  40 mg Oral Daily   Continuous Infusions:  sodium chloride     Followed by   sodium chloride     PRN Meds: acetaminophen, nitroGLYCERIN, ondansetron (ZOFRAN) IV   Vital Signs    Vitals:   04/13/21 0100 04/13/21 0300 04/13/21 0357 04/13/21 0400  BP: 128/80 111/69 (!) 148/95   Pulse:   66   Resp:   18   Temp:   98.3 F (36.8 C)   TempSrc:   Oral   SpO2:   98%   Weight:   103.6 kg 103.6 kg  Height:    5\' 10"  (1.778 m)    Intake/Output Summary (Last 24 hours) at 04/13/2021 0516 Last data filed at 04/12/2021 2200 Gross per 24 hour  Intake 240 ml  Output --  Net 240 ml   Last 3 Weights 04/13/2021 04/13/2021 03/17/2021  Weight (lbs) 228 lb 6.3 oz 228 lb 6.3 oz 235 lb  Weight (kg) 103.6 kg 103.6 kg 106.595 kg      Telemetry    NSR - Personally Reviewed  ECG    NSR with nonspecific ST-T wave changes - Personally Reviewed  Physical Exam   GEN: No acute distress.   Neck: No JVD Cardiac: RRR, soft systolic murmur, no rubs  Respiratory: Clear to auscultation bilaterally. GI: Soft, nontender, non-distended  MS: No edema; No deformity. Neuro:  Nonfocal  Psych: Normal affect   Labs    High Sensitivity Troponin:   Recent Labs  Lab 04/12/21 1155 04/12/21 1404  TROPONINIHS 10 9     Chemistry Recent Labs  Lab 04/12/21 1155 04/13/21 0342  NA 138 136  K 3.8 3.5  CL 104 102  CO2 25 25  GLUCOSE 110* 101*   BUN 9 10  CREATININE 1.18 0.86  CALCIUM 8.9 8.8*  GFRNONAA >60 >60  ANIONGAP 9 9    Lipids  Recent Labs  Lab 04/13/21 0342  CHOL 109  TRIG 119  HDL 32*  LDLCALC 53  CHOLHDL 3.4    Hematology Recent Labs  Lab 04/12/21 1155 04/13/21 0342  WBC 7.5 7.6  RBC 3.91* 3.58*  HGB 13.0 12.0*  HCT 37.4* 34.2*  MCV 95.7 95.5  MCH 33.2 33.5  MCHC 34.8 35.1  RDW 12.5 12.5  PLT 264 205   Thyroid No results for input(s): TSH, FREET4 in the last 168 hours.  BNPNo results for input(s): BNP, PROBNP in the last 168 hours.  DDimer No results for input(s): DDIMER in the last 168 hours.   Radiology    DG Chest 2 View  Result Date: 04/12/2021 CLINICAL DATA:  Chest pain EXAM: CHEST - 2 VIEW COMPARISON:  None FINDINGS: Heart size and mediastinal contours appear normal. No pleural effusion or edema. No airspace densities identified. Postsurgical changes with resection of the distal left clavicle noted. Mild degenerative changes within the thoracic spine. IMPRESSION: No active cardiopulmonary  abnormalities. Electronically Signed   By: Kerby Moors M.D.   On: 04/12/2021 13:05    Cardiac Studies   Transthoracic Echocardiogram: Date: 04/20/2016 Results: Study Conclusions   - Left ventricle: The cavity size was normal. Wall thickness was    normal. Systolic function was mildly to moderately reduced. The    estimated ejection fraction was in the range of 40% to 45%.    Diffuse hypokinesis. Doppler parameters are consistent with    abnormal left ventricular relaxation (grade 1 diastolic    dysfunction).  - Aortic valve: Trileaflet; mildly thickened, mildly calcified    leaflets. There was trivial regurgitation.  - Mitral valve: There was mild regurgitation.    Carotid Vascular Duplex: Date: 01/23/2016 Results: Bilateral: mild mixed plaque origin ICA. Mild soft plaque proximal  ICA and ECA. Vertebral artery flow is antegrade. 1-39% ICA  plaquing. Left ECA velocities are elevated.     Left/Right Heart Catheterizations: Date: 01/20/2016 Results: Mid RCA lesion, 10 %stenosed. Ost 1st Mrg to 1st Mrg lesion, 20 %stenosed. 2nd Mrg lesion, 20 %stenosed. Mid Cx lesion, 10 %stenosed. Prox Cx to Mid Cx lesion, 10 %stenosed. Ost LM to LM lesion, 30 %stenosed. Ost 1st Diag to 1st Diag lesion, 100 %stenosed. A STENT PROMUS PREM MR 3.5X16 drug eluting stent was successfully placed. Prox LAD lesion, 100 %stenosed. Post intervention, there is a 0% residual stenosis. Mid LAD lesion, 20 %stenosed. There is moderate left ventricular systolic dysfunction. LV end diastolic pressure is mildly elevated. The left ventricular ejection fraction is 25-35% by visual estimate. There is no mitral valve regurgitation.   1. Acute anterior STEMI secondary to occluded proximal LAD 2. Successful PTCA/DES x 1 proximal to mid LAD 3. Mild disease in the left main, RCA and Circumflex 4. Moderate segmental LV systolic dysfunction   Recommendations: Will continue DAPT for one year with ASA and Brilinta. Will start beta blocker and change statin to Crestor. Will continue Ace-inh. Echo in before discharge.   Patient Profile     65 y.o. male with history of LAD STEMI s/p PCI in 2017, ischemic cardiomyopathy with LVEF 25-30% which improved to 40-45%, prior CVA with mild carotid disease, HTN and HLD who presented with chest pain with concern for UA.   Assessment & Plan    #Chest Pain/Concern for UA: #Known CAD with history of STEMI s/p LAD PCI: Patient presents with exertional substernal chest pressure that has been increasing in frequency over the past month in the setting of known CAD. Specifically, patient suffered LAD STEMI in 2017 s/p PCI with mild residual disease in LM, RCA, Lcx. Given exertional symptoms with known CAD, plan for cath. -NPO for cath today -Start heparin now given recurrent substernal chest pain this AM with minimal activity -Follow-up TTE -Continue ASA 81mg  daily  -Continue  coreg 25mg  BID -Will resume ACE post-cath -Continue crestor 40mg  daily -Nitro prn   #Ischemic CM with LVEF 40-45%: Patient with history of ischemic CM with initial EF 25-30% which improved to 40-45% on TTE in 2017. Currently compensated and euvolemic on exam.  -Repeat TTE as above -Continue home coreg 25mg  BID -Will resume home ACE post-cath (can consider entresto in future as well pending EF) -Will add spiro/SGLT2i as tolerated   #HLD: LDL well controlled at 53, TG 119, HDL 32 -Continue home crestor 40mg  daily   #HTN: -Continue home coreg 25mg  BID -Will resume home ACE post-cath  INFORMED CONSENT: I have reviewed the risks, indications, and alternatives to cardiac catheterization, possible angioplasty, and  stenting with the patient. Risks include but are not limited to bleeding, infection, vascular injury, stroke, myocardial infection, arrhythmia, kidney injury, radiation-related injury in the case of prolonged fluoroscopy use, emergency cardiac surgery, and death. The patient understands the risks of serious complication is 1-2 in 0149 with diagnostic cardiac cath and 1-2% or less with angioplasty/stenting.    For questions or updates, please contact [place] Please consult [email or web address] for contact info under        Signed, Freada Bergeron, MD  04/13/2021, 5:16 AM

## 2021-04-12 NOTE — ED Provider Notes (Signed)
[facility] EMERGENCY DEPARTMENT Provider Note   CSN: [id number] Arrival date & time: 04/12/21  1134     History CC: Chest pain   Dwayne [name] is a 65 y.o. male w/ hx of CAD c/b STEMI in 2017 (LAD s/p stent), presenting to the ED with chest pain.  The patient reports that approximately 1 month ago, in October, when he was in [place], he had an episode of chest pain that began with exertion that felt like squeezing the left side of his chest.  He took a nitro and the pain gradually went away.  He noted for the past several days or weeks, he will occasionally get "a twinge in my chest" when he is walking or with exertion.  These generally go away.  This morning when he was taking out the trash, and then climbing the stairs back to his porch, he began to have a squeezing pain in his left breast again.  This was associated with some queasiness in his stomach.  No diaphoresis or lightheadedness.  He says the symptoms did not feel like his heart attack in 2017.  He took 1 sublingual nitroglycerin and there was moderate relief of his symptoms within 10 minutes.  Currently his chest discomfort is 2 out of 10.  Does not radiate anywhere.  Nothing seems to make it worse.  He denies any fevers, chills, cough, congestion.  He did take full dose aspirin prior to coming into the hospital.  He reports normally he takes 81 mg of aspirin daily.  He was taken off of his Plavix last month due to a hip replacement, which was completed 2 weeks ago, and advised not to restart his Plavix or any other anticoagulation until he sees his cardiologist in January.    He otherwise takes a statin for high cholesterol, Coreg and lisinopril for high blood pressure.  He has been compliant with his medications.  He denies smoking history.  He denies any history of significant cardiac disease in his family meaning MI under the age of 31  His cardiologist is Dr Acie Fredrickson for [facility]  [place] 2017 most  recently:  Mid RCA lesion, 10 %stenosed. Ost 1st Mrg to 1st Mrg lesion, 20 %stenosed. 2nd Mrg lesion, 20 %stenosed. Mid Cx lesion, 10 %stenosed. Prox Cx to Mid Cx lesion, 10 %stenosed. Ost LM to LM lesion, 30 %stenosed. Ost 1st Diag to 1st Diag lesion, 100 %stenosed. A STENT PROMUS PREM MR 3.5X16 drug eluting stent was successfully placed. Prox LAD lesion, 100 %stenosed. Post intervention, there is a 0% residual stenosis. Mid LAD lesion, 20 %stenosed. There is moderate left ventricular systolic dysfunction. LV end diastolic pressure is mildly elevated. The left ventricular ejection fraction is 25-35% by visual estimate. There is no mitral valve regurgitation.   1. Acute anterior STEMI secondary to occluded proximal LAD 2. Successful PTCA/DES x 1 proximal to mid LAD 3. Mild disease in the left main, RCA and Circumflex 4. Moderate segmental LV systolic dysfunction  HPI     Past Medical History:  Diagnosis Date   Acute ST elevation myocardial infarction (STEMI) involving left anterior descending (LAD) coronary artery (HCC)    Adenomatous colon polyp 2000   Arthritis    Coronary artery disease involving native coronary artery of native heart without angina pectoris 03/23/2016   Diverticulosis    Erectile dysfunction    History of stroke 01/23/2017   Hypercholesteremia    Hypertension    Internal hemorrhoids    Kidney stones  Myocardial infarction [facility])    Occipital cerebral infarction ([place]) 03/23/2016   Splenomegaly     Patient Active Problem List   Diagnosis Date Noted   Unstable angina ([place]) 04/12/2021   Ischemic cardiomyopathy 03/17/2021   Diverticulosis of colon 11/19/2020   Kidney stone 11/19/2020   Stable angina ([place]) 11/19/2020   Internal hemorrhoids    Erectile dysfunction    History of stroke 01/23/2017   Occipital cerebral infarction [facility]) 03/23/2016   Coronary artery disease involving native coronary artery of native heart without angina pectoris  03/23/2016   Antiplatelet or antithrombotic long-term use 02/02/2016   Syncope 01/23/2016   Chronic combined systolic and diastolic heart failure ([place]) 01/22/2016   Dizziness 01/22/2016   Hypertension    Hypercholesteremia    Near syncope    Acute ST elevation myocardial infarction (STEMI) involving left anterior descending (LAD) coronary artery (HCC)    Hx of adenomatous colonic polyps 04/14/2014    Past Surgical History:  Procedure Laterality Date   CARDIAC CATHETERIZATION N/A 01/20/2016   Procedure: Left Heart Cath and Coronary Angiography;  Surgeon: Burnell Blanks, MD; LAD 100%, D1 100%, multi-vessel dz 10-30% in other vessels, EF 25-35%, elevated LVEDP   CARDIAC CATHETERIZATION N/A 01/20/2016   Procedure: Coronary Stent Intervention;  Surgeon: Burnell Blanks, MD; PROMUS PREM MR 3.5X16 mm DES LAD, D1 jailed by the stent but was already occluded   CHOLECYSTECTOMY  2009   COLONOSCOPY     EYE SURGERY     Age [place]  2008   SHOULDER SURGERY  1998   Left shoulder fracture-clavicle   WRIST FRACTURE SURGERY  1995   Bilateral       Family History  Problem Relation Age of Onset   Heart disease Father    CVA Father    CVA Brother    CVA Brother    CVA Brother    Colon polyps Neg Hx    Colon cancer Neg Hx    Esophageal cancer Neg Hx    Stomach cancer Neg Hx    Pancreatic cancer Neg Hx    Liver disease Neg Hx     Social History   Tobacco Use   Smoking status: Never   Smokeless tobacco: Never  Vaping Use   Vaping Use: Never used  Substance Use Topics   Alcohol use: Yes    Alcohol/week: 0.0 standard drinks    Comment: 2+ per week   Drug use: No    Home Medications Prior to Admission medications   Medication Sig Start Date End Date Taking? Authorizing Provider  AMBULATORY NON FORMULARY MEDICATION Apply 1 application topically daily as needed (pain). Medication Name: CBD Oil - daily   Yes [provider]  aspirin EC 81 MG tablet Take by mouth. 03/17/21  Yes [provider]  carvedilol (COREG) 25 MG tablet TAKE 1 TABLET BY MOUTH 2 (TWO) TIMES DAILY. PLEASE KEEP UPCOMING APPT IN [facility] 2022 07/28/20  Yes Nahser, Wonda Cheng, MD  lisinopril (ZESTRIL) 5 MG tablet TAKE 1 TABLET BY MOUTH EVERY DAY 07/28/20  Yes Nahser, Wonda Cheng, MD  Multiple Vitamin (MULTIVITAMIN WITH MINERALS) TABS tablet Take 1 tablet by mouth daily.   Yes [provider]  nitroGLYCERIN (NITROSTAT) 0.4 MG SL tablet Place 1 tablet (0.4 mg total) under the tongue every 5 (five) minutes as needed for chest pain. 01/22/16  Yes Barrett, Evelene Croon, PA-C  ondansetron (ZOFRAN) 4 MG tablet Take  4 mg by mouth every 8 (eight) hours as needed for nausea/vomiting, nausea or vomiting. 03/17/21  Yes [provider]  oxyCODONE (OXY IR/ROXICODONE) 5 MG immediate release tablet Take 5 mg by mouth every 6 (six) hours as needed for pain or moderate pain. 03/17/21  Yes [provider]  rosuvastatin (CRESTOR) 40 MG tablet Take 40 mg by mouth daily.   Yes [provider]  terbinafine (LAMISIL) 250 MG tablet Take 1 tablet (250 mg total) by mouth daily. Patient taking differently: Take 250 mg by mouth every evening. 01/26/21  Yes Hyatt, Max T, DPM  tiZANidine (ZANAFLEX) 2 MG tablet Take 4 mg by mouth every 8 (eight) hours as needed for muscle spasms. 03/17/21  Yes [provider]  aspirin 81 MG chewable tablet Chew 1 tablet (81 mg total) by mouth daily. Patient not taking: No sig reported 01/22/16   Barrett, Evelene Croon, PA-C    Allergies    Codeine and Lipitor [atorvastatin]  Review of Systems   Review of Systems  Constitutional:  Negative for chills and fever.  HENT:  Negative for ear pain and sore throat.   Respiratory:  Positive for chest tightness. Negative for shortness of breath.   Cardiovascular:  Positive for chest pain. Negative for palpitations.  Gastrointestinal:  Positive for nausea.  Negative for abdominal pain and vomiting.  Musculoskeletal:  Positive for arthralgias. Negative for back pain.  Skin:  Negative for color change and rash.  Neurological:  Negative for syncope, light-headedness and headaches.  All other systems reviewed and are negative.  Physical Exam Updated Vital Signs BP 130/90   Pulse 78   Temp 98.3 F (36.8 C) (Oral)   Resp 15   SpO2 97%   Physical Exam Constitutional:      General: He is not in acute distress. HENT:     Head: Normocephalic and atraumatic.  Eyes:     Conjunctiva/sclera: Conjunctivae normal.     Pupils: Pupils are equal, round, and reactive to light.  Cardiovascular:     Rate and Rhythm: Normal rate and regular rhythm.     Pulses: Normal pulses.  Pulmonary:     Effort: Pulmonary effort is normal. No respiratory distress.  Skin:    General: Skin is warm and dry.  Neurological:     General: No focal deficit present.     Mental Status: He is alert. Mental status is at baseline.  Psychiatric:        Mood and Affect: Mood normal.        Behavior: Behavior normal.    ED Results / Procedures / Treatments   Labs (all labs ordered are listed, but only abnormal results are displayed) Labs Reviewed  BASIC METABOLIC PANEL - Abnormal; Notable for the following components:      Result Value   Glucose, Bld 110 (*)    All other components within normal limits  CBC - Abnormal; Notable for the following components:   RBC 3.91 (*)    HCT 37.4 (*)    All other components within normal limits  RESP PANEL BY RT-PCR (FLU A&B, COVID) ARPGX2  HIV ANTIBODY (ROUTINE TESTING W REFLEX)  BASIC METABOLIC PANEL  HEMOGLOBIN A1C  LIPID PANEL  CBC  TROPONIN I (HIGH SENSITIVITY)  TROPONIN I (HIGH SENSITIVITY)    EKG EKG Interpretation  Date/Time:  Monday April 12 2021 11:55:48 EST Ventricular Rate:  85 PR Interval:  156 QRS Duration: 82 QT Interval:  368 QTC Calculation: 437 R Axis:   -  7 Text Interpretation: Normal sinus  rhythm Normal ECG Confirmed by Octaviano Glow [phone]) on 04/12/2021 3:06:11 PM  Radiology DG Chest 2 View  Result Date: 04/12/2021 CLINICAL DATA:  Chest pain EXAM: CHEST - 2 VIEW COMPARISON:  None FINDINGS: Heart size and mediastinal contours appear normal. No pleural effusion or edema. No airspace densities identified. Postsurgical changes with resection of the distal left clavicle noted. Mild degenerative changes within the thoracic spine. IMPRESSION: No active cardiopulmonary abnormalities. Electronically Signed   By: Kerby Moors M.D.   On: 04/12/2021 13:05    Procedures Procedures   Medications Ordered in ED Medications  nitroGLYCERIN (NITROSTAT) SL tablet 0.4 mg (0.4 mg Sublingual Given 04/12/21 1633)  aspirin EC tablet 81 mg (has no administration in time range)  acetaminophen (TYLENOL) tablet 650 mg (has no administration in time range)  ondansetron (ZOFRAN) injection 4 mg (has no administration in time range)  heparin injection 5,000 Units (has no administration in time range)  carvedilol (COREG) tablet 25 mg (25 mg Oral Given 04/12/21 1814)  rosuvastatin (CRESTOR) tablet 40 mg (has no administration in time range)    ED Course  I have reviewed the triage vital signs and the nursing notes.  Pertinent labs & imaging results that were available during my care of the patient were reviewed by me and considered in my medical decision making (see chart for details).  This patient presents to the Emergency Department with complaint of chest pain. This involves an extensive number of treatment options, and is a complaint that carries with it a high risk of complications and morbidity.  The differential diagnosis includes ACS vs Pneumothorax vs Reflux/Gastritis vs MSK pain vs Pneumonia vs other.  Overall this presentation could be consistent with angina.  As he does continue to have mild chest discomfort, I have asked his nurse to provide him additional sublingual nitroglycerin to see  if we can achieve chest pain-free.  I felt PE was less likely given that his symptoms have been intermittent, he has no hypoxia, no tachycardia, no respiratory complaints  Took 325 aspirin at home this morning  I ordered, reviewed, and interpreted labs, including BMP and CBC.  There were no immediate, life-threatening emergencies found in this labwork.  The patient's troponin level was 10, with repeat showing 9.  Flat I ordered medication SL nitro for chest pain I ordered imaging studies which included dg chest I independently visualized and interpreted imaging which showed no acute intrathoracic pathology, and the monitor tracing which showed NSR Previous records obtained and reviewed showing LHC from 2017 I personally reviewed the patients ECG which showed sinus rhythm with no acute ischemic findings  Cardiology was consulted recommending hospitalization for cardiac w/u  After the interventions stated above, I reevaluated the patient and found that they remained clinically stable.   Clinical Course as of 04/12/21 2051  Mon Apr 12, 2021  1526 Paged cardiology [MT]  1528 Consult placed with Candise Bowens [MT]  3790 Troponin I (High Sensitivity): 9 [MT]  2409 Admitted by cardiology team [MT]    Clinical Course User Index [MT] Wyvonnia Dusky, MD    Final Clinical Impression(s) / ED Diagnoses Final diagnoses:  Chest pain, unspecified type    Rx / DC Orders ED Discharge Orders     None        Wyvonnia Dusky, MD 04/12/21 2051

## 2021-04-13 ENCOUNTER — Encounter (HOSPITAL_COMMUNITY)
Admission: EM | Disposition: A | Discharge status: Home / Self Care | Payer: Self-pay | Source: Home / Self Care | Attending: Thoracic Surgery (Cardiothoracic Vascular Surgery)

## 2021-04-13 ENCOUNTER — Observation Stay (HOSPITAL_COMMUNITY): Payer: PPO

## 2021-04-13 ENCOUNTER — Encounter (HOSPITAL_COMMUNITY): Payer: Self-pay | Admitting: Cardiovascular Disease

## 2021-04-13 DIAGNOSIS — Z823 Family history of stroke: Secondary | ICD-10-CM | POA: Diagnosis not present

## 2021-04-13 DIAGNOSIS — I10 Essential (primary) hypertension: Secondary | ICD-10-CM

## 2021-04-13 DIAGNOSIS — I255 Ischemic cardiomyopathy: Secondary | ICD-10-CM | POA: Diagnosis not present

## 2021-04-13 DIAGNOSIS — R079 Chest pain, unspecified: Secondary | ICD-10-CM | POA: Diagnosis not present

## 2021-04-13 DIAGNOSIS — E8770 Fluid overload, unspecified: Secondary | ICD-10-CM | POA: Diagnosis not present

## 2021-04-13 DIAGNOSIS — J984 Other disorders of lung: Secondary | ICD-10-CM | POA: Diagnosis not present

## 2021-04-13 DIAGNOSIS — Z8673 Personal history of transient ischemic attack (TIA), and cerebral infarction without residual deficits: Secondary | ICD-10-CM | POA: Diagnosis not present

## 2021-04-13 DIAGNOSIS — I25110 Atherosclerotic heart disease of native coronary artery with unstable angina pectoris: Secondary | ICD-10-CM | POA: Diagnosis not present

## 2021-04-13 DIAGNOSIS — I350 Nonrheumatic aortic (valve) stenosis: Secondary | ICD-10-CM | POA: Diagnosis not present

## 2021-04-13 DIAGNOSIS — Z20822 Contact with and (suspected) exposure to covid-19: Secondary | ICD-10-CM | POA: Diagnosis not present

## 2021-04-13 DIAGNOSIS — R0789 Other chest pain: Secondary | ICD-10-CM | POA: Diagnosis present

## 2021-04-13 DIAGNOSIS — K648 Other hemorrhoids: Secondary | ICD-10-CM | POA: Diagnosis present

## 2021-04-13 DIAGNOSIS — Z7982 Long term (current) use of aspirin: Secondary | ICD-10-CM | POA: Diagnosis not present

## 2021-04-13 DIAGNOSIS — Z9049 Acquired absence of other specified parts of digestive tract: Secondary | ICD-10-CM | POA: Diagnosis not present

## 2021-04-13 DIAGNOSIS — I440 Atrioventricular block, first degree: Secondary | ICD-10-CM | POA: Diagnosis present

## 2021-04-13 DIAGNOSIS — J939 Pneumothorax, unspecified: Secondary | ICD-10-CM | POA: Diagnosis not present

## 2021-04-13 DIAGNOSIS — Z9889 Other specified postprocedural states: Secondary | ICD-10-CM | POA: Diagnosis not present

## 2021-04-13 DIAGNOSIS — R918 Other nonspecific abnormal finding of lung field: Secondary | ICD-10-CM | POA: Diagnosis not present

## 2021-04-13 DIAGNOSIS — I080 Rheumatic disorders of both mitral and aortic valves: Secondary | ICD-10-CM | POA: Diagnosis not present

## 2021-04-13 DIAGNOSIS — I5042 Chronic combined systolic (congestive) and diastolic (congestive) heart failure: Secondary | ICD-10-CM | POA: Diagnosis not present

## 2021-04-13 DIAGNOSIS — Z955 Presence of coronary angioplasty implant and graft: Secondary | ICD-10-CM | POA: Diagnosis not present

## 2021-04-13 DIAGNOSIS — Z79899 Other long term (current) drug therapy: Secondary | ICD-10-CM | POA: Diagnosis not present

## 2021-04-13 DIAGNOSIS — J9811 Atelectasis: Secondary | ICD-10-CM | POA: Diagnosis not present

## 2021-04-13 DIAGNOSIS — Z01810 Encounter for preprocedural cardiovascular examination: Secondary | ICD-10-CM | POA: Diagnosis not present

## 2021-04-13 DIAGNOSIS — E7800 Pure hypercholesterolemia, unspecified: Secondary | ICD-10-CM | POA: Diagnosis not present

## 2021-04-13 DIAGNOSIS — I351 Nonrheumatic aortic (valve) insufficiency: Secondary | ICD-10-CM | POA: Diagnosis not present

## 2021-04-13 DIAGNOSIS — Z952 Presence of prosthetic heart valve: Secondary | ICD-10-CM | POA: Diagnosis not present

## 2021-04-13 DIAGNOSIS — J9601 Acute respiratory failure with hypoxia: Secondary | ICD-10-CM | POA: Diagnosis not present

## 2021-04-13 DIAGNOSIS — I252 Old myocardial infarction: Secondary | ICD-10-CM | POA: Diagnosis not present

## 2021-04-13 DIAGNOSIS — I719 Aortic aneurysm of unspecified site, without rupture: Secondary | ICD-10-CM | POA: Diagnosis not present

## 2021-04-13 DIAGNOSIS — I200 Unstable angina: Secondary | ICD-10-CM | POA: Diagnosis not present

## 2021-04-13 DIAGNOSIS — D6959 Other secondary thrombocytopenia: Secondary | ICD-10-CM | POA: Diagnosis not present

## 2021-04-13 DIAGNOSIS — Z8249 Family history of ischemic heart disease and other diseases of the circulatory system: Secondary | ICD-10-CM | POA: Diagnosis not present

## 2021-04-13 DIAGNOSIS — I110 Hypertensive heart disease with heart failure: Secondary | ICD-10-CM | POA: Diagnosis not present

## 2021-04-13 DIAGNOSIS — J90 Pleural effusion, not elsewhere classified: Secondary | ICD-10-CM | POA: Diagnosis not present

## 2021-04-13 DIAGNOSIS — D62 Acute posthemorrhagic anemia: Secondary | ICD-10-CM | POA: Diagnosis not present

## 2021-04-13 DIAGNOSIS — J9600 Acute respiratory failure, unspecified whether with hypoxia or hypercapnia: Secondary | ICD-10-CM | POA: Diagnosis not present

## 2021-04-13 DIAGNOSIS — Z4682 Encounter for fitting and adjustment of non-vascular catheter: Secondary | ICD-10-CM | POA: Diagnosis not present

## 2021-04-13 DIAGNOSIS — I700 Atherosclerosis of aorta: Secondary | ICD-10-CM | POA: Diagnosis not present

## 2021-04-13 DIAGNOSIS — Z96641 Presence of right artificial hip joint: Secondary | ICD-10-CM | POA: Diagnosis not present

## 2021-04-13 HISTORY — PX: LEFT HEART CATH AND CORONARY ANGIOGRAPHY: CATH118249

## 2021-04-13 LAB — BASIC METABOLIC PANEL
Anion gap: 9 (ref 5–15)
BUN: 10 mg/dL (ref 8–23)
CO2: 25 mmol/L (ref 22–32)
Calcium: 8.8 mg/dL — ABNORMAL LOW (ref 8.9–10.3)
Chloride: 102 mmol/L (ref 98–111)
Creatinine, Ser: 0.86 mg/dL (ref 0.61–1.24)
GFR, Estimated: >60 mL/min (ref >60)
Glucose, Bld: 101 mg/dL — ABNORMAL HIGH (ref 70–99)
Potassium: 3.5 mmol/L (ref 3.5–5.1)
Sodium: 136 mmol/L (ref 135–145)

## 2021-04-13 LAB — CBC
HCT: 34.2 % — ABNORMAL LOW (ref 39.0–52.0)
Hemoglobin: 12 g/dL — ABNORMAL LOW (ref 13.0–17.0)
MCH: 33.5 pg (ref 26.0–34.0)
MCHC: 35.1 g/dL (ref 30.0–36.0)
MCV: 95.5 fL (ref 80.0–100.0)
Platelets: 205 10*3/uL (ref 150–400)
RBC: 3.58 MIL/uL — ABNORMAL LOW (ref 4.22–5.81)
RDW: 12.5 % (ref 11.5–15.5)
WBC: 7.6 10*3/uL (ref 4.0–10.5)
nRBC: 0 % (ref 0.0–0.2)

## 2021-04-13 LAB — ECHOCARDIOGRAM COMPLETE
Area-P 1/2: 2.39 cm2
Calc EF: 45.2 %
Height: 70 in
P 1/2 time: 754 msec
S' Lateral: 4.5 cm
Single Plane A2C EF: 43.7 %
Single Plane A4C EF: 45.5 %
Weight: 3654.34 oz

## 2021-04-13 LAB — HIV ANTIBODY (ROUTINE TESTING W REFLEX): HIV Screen 4th Generation wRfx: NONREACTIVE

## 2021-04-13 LAB — LIPID PANEL
Cholesterol: 109 mg/dL (ref 0–200)
HDL: 32 mg/dL — ABNORMAL LOW (ref >40)
LDL Cholesterol: 53 mg/dL (ref 0–99)
Total CHOL/HDL Ratio: 3.4 RATIO
Triglycerides: 119 mg/dL (ref <150)
VLDL: 24 mg/dL (ref 0–40)

## 2021-04-13 SURGERY — LEFT HEART CATH AND CORONARY ANGIOGRAPHY
Anesthesia: LOCAL

## 2021-04-13 MED ORDER — SODIUM CHLORIDE 0.9 % WEIGHT BASED INFUSION
3.0000 mL/kg/h | INTRAVENOUS | Status: DC
  Administered 2021-04-13: 3 mL/kg/h via INTRAVENOUS

## 2021-04-13 MED ORDER — SODIUM CHLORIDE 0.9% FLUSH: 3.0000 mL | INTRAVENOUS | Status: DC | PRN

## 2021-04-13 MED ORDER — VERAPAMIL HCL 2.5 MG/ML IV SOLN
INTRAVENOUS | Status: DC | PRN
  Administered 2021-04-13 (×2): 10 mL via INTRA_ARTERIAL

## 2021-04-13 MED ORDER — HYDRALAZINE HCL 20 MG/ML IJ SOLN: 10.0000 mg | INTRAMUSCULAR | Status: AC | PRN

## 2021-04-13 MED ORDER — HEPARIN (PORCINE) IN NACL 1000-0.9 UT/500ML-% IV SOLN
INTRAVENOUS | Status: AC
  Filled 2021-04-13: qty 500

## 2021-04-13 MED ORDER — SODIUM CHLORIDE 0.9 % IV SOLN: 250.0000 mL | INTRAVENOUS | Status: DC | PRN

## 2021-04-13 MED ORDER — MIDAZOLAM HCL 2 MG/2ML IJ SOLN
INTRAMUSCULAR | Status: DC | PRN
  Administered 2021-04-13: 2 mg via INTRAVENOUS

## 2021-04-13 MED ORDER — IOHEXOL 350 MG/ML SOLN
INTRAVENOUS | Status: DC | PRN
  Administered 2021-04-13: 65 mL

## 2021-04-13 MED ORDER — HEPARIN SODIUM (PORCINE) 1000 UNIT/ML IJ SOLN
INTRAMUSCULAR | Status: DC | PRN
  Administered 2021-04-13: 5200 [IU] via INTRAVENOUS

## 2021-04-13 MED ORDER — LIDOCAINE HCL (PF) 1 % IJ SOLN
INTRAMUSCULAR | Status: AC
  Filled 2021-04-13: qty 30

## 2021-04-13 MED ORDER — ASPIRIN 81 MG PO CHEW
81.0000 mg | CHEWABLE_TABLET | ORAL | Status: AC
  Administered 2021-04-13: 81 mg via ORAL
  Filled 2021-04-13: qty 1

## 2021-04-13 MED ORDER — HEPARIN BOLUS VIA INFUSION
4000.0000 [IU] | Freq: Once | INTRAVENOUS | Status: AC
  Administered 2021-04-13: 4000 [IU] via INTRAVENOUS
  Filled 2021-04-13: qty 4000

## 2021-04-13 MED ORDER — SODIUM CHLORIDE 0.9 % WEIGHT BASED INFUSION: 3.0000 mL/kg/h | INTRAVENOUS | Status: DC

## 2021-04-13 MED ORDER — HEPARIN (PORCINE) IN NACL 1000-0.9 UT/500ML-% IV SOLN
INTRAVENOUS | Status: DC | PRN
  Administered 2021-04-13 (×2): 500 mL

## 2021-04-13 MED ORDER — LIDOCAINE HCL (PF) 1 % IJ SOLN
INTRAMUSCULAR | Status: DC | PRN
  Administered 2021-04-13: 2 mL via INTRADERMAL

## 2021-04-13 MED ORDER — HEPARIN (PORCINE) 25000 UT/250ML-% IV SOLN
1450.0000 [IU]/h | INTRAVENOUS | Status: DC
  Administered 2021-04-13 – 2021-04-14 (×2): 1250 [IU]/h via INTRAVENOUS
  Administered 2021-04-15: 04:00:00 1450 [IU]/h via INTRAVENOUS
  Filled 2021-04-13 (×2): qty 250

## 2021-04-13 MED ORDER — SODIUM CHLORIDE 0.9 % WEIGHT BASED INFUSION
1.0000 mL/kg/h | INTRAVENOUS | Status: DC
  Administered 2021-04-13: 1 mL/kg/h via INTRAVENOUS

## 2021-04-13 MED ORDER — DIAZEPAM 5 MG PO TABS
5.0000 mg | ORAL_TABLET | ORAL | Status: DC | PRN
  Administered 2021-04-14 – 2021-04-15 (×2): 5 mg via ORAL
  Filled 2021-04-13 (×2): qty 1

## 2021-04-13 MED ORDER — MIDAZOLAM HCL 2 MG/2ML IJ SOLN
INTRAMUSCULAR | Status: AC
  Filled 2021-04-13: qty 2

## 2021-04-13 MED ORDER — SODIUM CHLORIDE 0.9% FLUSH
3.0000 mL | Freq: Two times a day (BID) | INTRAVENOUS | Status: DC
  Administered 2021-04-13: 3 mL via INTRAVENOUS

## 2021-04-13 MED ORDER — ASPIRIN 81 MG PO CHEW
81.0000 mg | CHEWABLE_TABLET | Freq: Every day | ORAL | Status: DC
  Administered 2021-04-14: 81 mg via ORAL
  Filled 2021-04-13 (×2): qty 1

## 2021-04-13 MED ORDER — ONDANSETRON HCL 4 MG/2ML IJ SOLN
4.0000 mg | Freq: Four times a day (QID) | INTRAMUSCULAR | Status: DC | PRN
  Administered 2021-04-14 – 2021-04-15 (×2): 4 mg via INTRAVENOUS
  Filled 2021-04-13 (×2): qty 2

## 2021-04-13 MED ORDER — ISOSORBIDE MONONITRATE ER 30 MG PO TB24
30.0000 mg | ORAL_TABLET | Freq: Every day | ORAL | Status: DC
  Administered 2021-04-13 – 2021-04-14 (×2): 30 mg via ORAL
  Filled 2021-04-13 (×2): qty 1

## 2021-04-13 MED ORDER — SODIUM CHLORIDE 0.9 % WEIGHT BASED INFUSION: 1.0000 mL/kg/h | INTRAVENOUS | Status: DC

## 2021-04-13 MED ORDER — HEPARIN (PORCINE) 25000 UT/250ML-% IV SOLN
1250.0000 [IU]/h | INTRAVENOUS | Status: DC
  Administered 2021-04-13: 1250 [IU]/h via INTRAVENOUS
  Filled 2021-04-13: qty 250

## 2021-04-13 MED ORDER — LABETALOL HCL 5 MG/ML IV SOLN: 10.0000 mg | INTRAVENOUS | Status: AC | PRN

## 2021-04-13 MED ORDER — HEPARIN SODIUM (PORCINE) 1000 UNIT/ML IJ SOLN
INTRAMUSCULAR | Status: AC
  Filled 2021-04-13: qty 1

## 2021-04-13 MED ORDER — SODIUM CHLORIDE 0.9% FLUSH
3.0000 mL | Freq: Two times a day (BID) | INTRAVENOUS | Status: DC
  Administered 2021-04-14: 3 mL via INTRAVENOUS

## 2021-04-13 MED ORDER — ACETAMINOPHEN 325 MG PO TABS
650.0000 mg | ORAL_TABLET | ORAL | Status: DC | PRN
  Administered 2021-04-14 – 2021-04-15 (×6): 650 mg via ORAL
  Filled 2021-04-13 (×6): qty 2

## 2021-04-13 MED ORDER — SODIUM CHLORIDE 0.9 % IV SOLN: INTRAVENOUS | Status: AC

## 2021-04-13 MED ORDER — FENTANYL CITRATE (PF) 100 MCG/2ML IJ SOLN
INTRAMUSCULAR | Status: AC
  Filled 2021-04-13: qty 2

## 2021-04-13 MED ORDER — FENTANYL CITRATE (PF) 100 MCG/2ML IJ SOLN
INTRAMUSCULAR | Status: DC | PRN
  Administered 2021-04-13: 50 ug via INTRAVENOUS

## 2021-04-13 MED ORDER — PERFLUTREN LIPID MICROSPHERE
1.0000 mL | INTRAVENOUS | Status: DC | PRN
Start: 2021-04-13 — End: 2021-04-13
  Administered 2021-04-13: 5 mL via INTRAVENOUS
  Filled 2021-04-13: qty 10

## 2021-04-13 SURGICAL SUPPLY — 11 items
CATH INFINITI 5 FR JL3.5 (CATHETERS) ×1 IMPLANT
CATH OPTITORQUE TIG 4.0 5F (CATHETERS) ×1 IMPLANT
DEVICE RAD COMP TR BAND LRG (VASCULAR PRODUCTS) ×1 IMPLANT
GLIDESHEATH SLEND SS 6F .021 (SHEATH) ×1 IMPLANT
GUIDEWIRE INQWIRE 1.5J.035X260 (WIRE) IMPLANT
INQWIRE 1.5J .035X260CM (WIRE) ×2
KIT HEART LEFT (KITS) ×2 IMPLANT
PACK CARDIAC CATHETERIZATION (CUSTOM PROCEDURE TRAY) ×2 IMPLANT
SHEATH PROBE COVER 6X72 (BAG) ×1 IMPLANT
TRANSDUCER W/STOPCOCK (MISCELLANEOUS) ×2 IMPLANT
TUBING CIL FLEX 10 FLL-RA (TUBING) ×2 IMPLANT

## 2021-04-13 NOTE — Interval H&P Note (Signed)
Cath Lab Visit (complete for each Cath Lab visit)  Clinical Evaluation Leading to the Procedure:   ACS: No.  Non-ACS:    Anginal Classification: CCS II  Anti-ischemic medical therapy: Minimal Therapy (1 class of medications)  Non-Invasive Test Results: No non-invasive testing performed  Prior CABG: No previous CABG      History and Physical Interval Note:  04/13/2021 12:21 PM  Dwayne [name]  has presented today for surgery, with the diagnosis of unstable angina.  The various methods of treatment have been discussed with the patient and family. After consideration of risks, benefits and other options for treatment, the patient has consented to  Procedure(s): LEFT HEART CATH AND CORONARY ANGIOGRAPHY (N/A) as a surgical intervention.  The patient's history has been reviewed, patient examined, no change in status, stable for surgery.  I have reviewed the patient's chart and labs.  Questions were answered to the patient's satisfaction.     Shelva Majestic

## 2021-04-13 NOTE — Progress Notes (Signed)
Pt stated he had 5/10 chest pain when ambulating back to bed at 5:30 am.  Pain subsided and currently is 0/10.  Pt requesting SL nitro.  PA notified - RN to administer nitro and morning medications with a sip of water.  Pt scheduled for cath this morning.  Will continue to monitor.

## 2021-04-13 NOTE — Progress Notes (Signed)
Pt returned from cath lab.  Contacted MD about heparin infusion.  Per Dr. Claiborne Billings orders, pt needs to heparinize 8 hours post TR band removal.  Heparin gtt will continue until surgery plan.

## 2021-04-13 NOTE — Progress Notes (Signed)
ANTICOAGULATION CONSULT NOTE - Initial Consult  Pharmacy Consult for Heparin Indication: chest pain/ACS  Allergies  Allergen Reactions   Codeine Nausea Only   Lipitor [Atorvastatin] Other (See Comments)    Myalgia    Patient Measurements: Height: 5\' 10"  (177.8 cm) Weight: 103.6 kg (228 lb 6.3 oz) IBW/kg (Calculated) : 73 Heparin Dosing Weight: 95 kg  Vital Signs: Temp: 98.4 F (36.9 C) (11/15 0739) Temp Source: Oral (11/15 0739) BP: 148/89 (11/15 0739) Pulse Rate: 74 (11/15 0739)  Labs: Recent Labs    04/12/21 1155 04/12/21 1404 04/13/21 0342  HGB 13.0  --  12.0*  HCT 37.4*  --  34.2*  PLT 264  --  205  CREATININE 1.18  --  0.86  TROPONINIHS 10 9  --     Estimated Creatinine Clearance: 100.4 mL/min (by C-G formula based on SCr of 0.86 mg/dL).   Medical History: Past Medical History:  Diagnosis Date   Acute ST elevation myocardial infarction (STEMI) involving left anterior descending (LAD) coronary artery (HCC)    Adenomatous colon polyp 2000   Arthritis    Coronary artery disease involving native coronary artery of native heart without angina pectoris 03/23/2016   Diverticulosis    Erectile dysfunction    History of stroke 01/23/2017   Hypercholesteremia    Hypertension    Internal hemorrhoids    Kidney stones    Myocardial infarction [facility])    Occipital cerebral infarction ([place]) 03/23/2016   Splenomegaly     Medications:  Medications Prior to Admission  Medication Sig Dispense Refill Last Dose   AMBULATORY NON FORMULARY MEDICATION Apply 1 application topically daily as needed (pain). Medication Name: CBD Oil - daily   Past Month   aspirin EC 81 MG tablet Take by mouth.   04/12/2021   carvedilol (COREG) 25 MG tablet TAKE 1 TABLET BY MOUTH 2 (TWO) TIMES DAILY. PLEASE KEEP UPCOMING APPT IN [facility] 2022 180 tablet 3 04/12/2021 at 0545   lisinopril (ZESTRIL) 5 MG tablet TAKE 1 TABLET BY MOUTH EVERY DAY 90 tablet 3 04/12/2021   Multiple Vitamin  (MULTIVITAMIN WITH MINERALS) TABS tablet Take 1 tablet by mouth daily.   Past Week   nitroGLYCERIN (NITROSTAT) 0.4 MG SL tablet Place 1 tablet (0.4 mg total) under the tongue every 5 (five) minutes as needed for chest pain. 25 tablet 3 04/12/2021 at 1030   ondansetron (ZOFRAN) 4 MG tablet Take 4 mg by mouth every 8 (eight) hours as needed for nausea/vomiting, nausea or vomiting.   unknown   oxyCODONE (OXY IR/ROXICODONE) 5 MG immediate release tablet Take 5 mg by mouth every 6 (six) hours as needed for pain or moderate pain.   04/11/2021   rosuvastatin (CRESTOR) 40 MG tablet Take 40 mg by mouth daily.   04/12/2021   terbinafine (LAMISIL) 250 MG tablet Take 1 tablet (250 mg total) by mouth daily. (Patient taking differently: Take 250 mg by mouth every evening.) 90 tablet 0 04/11/2021   tiZANidine (ZANAFLEX) 2 MG tablet Take 4 mg by mouth every 8 (eight) hours as needed for muscle spasms.   unknown   aspirin 81 MG chewable tablet Chew 1 tablet (81 mg total) by mouth daily. (Patient not taking: No sig reported)   Completed Course    Assessment:  65 y.o male presents to ER on 04/12/21 with chest pain.   Pharmacy consulted to start IV heparin for ACS/ STEMI.   Patient is not taking anticoagulation prior to admit.  SQ heparin 5k given this am @  0547>I have discontinued.  Patient is scheduled for cath this morning.  Goal of Therapy:  Heparin level 0.3-0.7 units/ml Monitor platelets by anticoagulation protocol: Yes   Plan:  Heparin bolus 4000 units IV x1 Heparin drip start at 1250 units/hr Check HL in 6 hr Daily HL, CBC Monitor for s/sx of bleeding  Pt scheduled for cath this morning   Nicole Cella, RPh Clinical Pharmacist [phone] 04/13/2021,9:27 AM Please check AMION for all [place] phone numbers After 10:00 PM, call [place] [phone]

## 2021-04-13 NOTE — Progress Notes (Addendum)
ANTICOAGULATION CONSULT NOTE - Follow up  Pharmacy Consult for Heparin Indication: chest pain/ACS  Allergies  Allergen Reactions   Codeine Nausea Only   Lipitor [Atorvastatin] Other (See Comments)    Myalgia    Patient Measurements: Height: 5\' 10"  (177.8 cm) Weight: 103.6 kg (228 lb 6.3 oz) IBW/kg (Calculated) : 73 Heparin Dosing Weight: 95 kg  Vital Signs: Temp: 98.4 F (36.9 C) (11/15 0739) Temp Source: Oral (11/15 0739) BP: 136/95 (11/15 1500) Pulse Rate: 74 (11/15 1500)  Labs: Recent Labs    04/12/21 1155 04/12/21 1404 04/13/21 0342  HGB 13.0  --  12.0*  HCT 37.4*  --  34.2*  PLT 264  --  205  CREATININE 1.18  --  0.86  TROPONINIHS 10 9  --      Estimated Creatinine Clearance: 100.4 mL/min (by C-G formula based on SCr of 0.86 mg/dL).   Medical History: Past Medical History:  Diagnosis Date   Acute ST elevation myocardial infarction (STEMI) involving left anterior descending (LAD) coronary artery (HCC)    Adenomatous colon polyp 2000   Arthritis    Coronary artery disease involving native coronary artery of native heart without angina pectoris 03/23/2016   Diverticulosis    Erectile dysfunction    History of stroke 01/23/2017   Hypercholesteremia    Hypertension    Internal hemorrhoids    Kidney stones    Myocardial infarction [facility])    Occipital cerebral infarction ([place]) 03/23/2016   Splenomegaly     Medications:  Medications Prior to Admission  Medication Sig Dispense Refill Last Dose   AMBULATORY NON FORMULARY MEDICATION Apply 1 application topically daily as needed (pain). Medication Name: CBD Oil - daily   Past Month   aspirin EC 81 MG tablet Take by mouth.   04/12/2021   carvedilol (COREG) 25 MG tablet TAKE 1 TABLET BY MOUTH 2 (TWO) TIMES DAILY. PLEASE KEEP UPCOMING APPT IN [facility] 2022 180 tablet 3 04/12/2021 at 0545   lisinopril (ZESTRIL) 5 MG tablet TAKE 1 TABLET BY MOUTH EVERY DAY 90 tablet 3 04/12/2021   Multiple Vitamin (MULTIVITAMIN  WITH MINERALS) TABS tablet Take 1 tablet by mouth daily.   Past Week   nitroGLYCERIN (NITROSTAT) 0.4 MG SL tablet Place 1 tablet (0.4 mg total) under the tongue every 5 (five) minutes as needed for chest pain. 25 tablet 3 04/12/2021 at 1030   ondansetron (ZOFRAN) 4 MG tablet Take 4 mg by mouth every 8 (eight) hours as needed for nausea/vomiting, nausea or vomiting.   unknown   oxyCODONE (OXY IR/ROXICODONE) 5 MG immediate release tablet Take 5 mg by mouth every 6 (six) hours as needed for pain or moderate pain.   04/11/2021   rosuvastatin (CRESTOR) 40 MG tablet Take 40 mg by mouth daily.   04/12/2021   terbinafine (LAMISIL) 250 MG tablet Take 1 tablet (250 mg total) by mouth daily. (Patient taking differently: Take 250 mg by mouth every evening.) 90 tablet 0 04/11/2021   tiZANidine (ZANAFLEX) 2 MG tablet Take 4 mg by mouth every 8 (eight) hours as needed for muscle spasms.   unknown   aspirin 81 MG chewable tablet Chew 1 tablet (81 mg total) by mouth daily. (Patient not taking: No sig reported)   Completed Course    Assessment:  65 y.o male presents to ER on 04/12/21 with chest pain.   Pharmacy consulted to start IV heparin for ACS/ STEMI.   Patient is not taking anticoagulation prior to admit.  SQ heparin 5k given this  am @0547 >I have discontinued.  Heparin bolus and heparin drip at 1250 units/hr started this morning.   Now s/p cardiac cath 04/13/21 with severe multivessel CAD: 80% fistal LM disease, 30% LAD, 90% D1.  Pharmacy consulted to restart heparin infusion 8 hour post sheath removal.  Sheath removed @ 13:08  Goal of Therapy:  Heparin level 0.3-0.7 units/ml Monitor platelets by anticoagulation protocol: Yes   Plan:  Tonight at 21:00 restart IV Heparin drip  1250 units/hr Check HL in 6 hr Daily HL, CBC Monitor for s/sx of bleeding    Nicole Cella, RPh Clinical Pharmacist [phone] 04/13/2021,3:36 PM Please check AMION for all [place] phone numbers After 10:00 PM, call [place]

## 2021-04-13 NOTE — Progress Notes (Addendum)
Progress Note  Patient Name: Dwayne [name] Date of Encounter: 04/14/2021  [facility] Cardiologist: Mertie Moores, MD   Subjective   Had another episode of chest pain this morning while ambulating to the bathroom. Got SL-NTG with improvement.   Cath with 80% fistal LM disease, 30% LAD, 90% D1. Now planned for CABG  Inpatient Medications    Scheduled Meds:  aspirin  81 mg Oral Daily   carvedilol  25 mg Oral BID WC   nitroGLYCERIN       rosuvastatin  40 mg Oral Daily   sodium chloride flush  3 mL Intravenous Q12H   Continuous Infusions:  sodium chloride     heparin 1,250 Units/hr (04/14/21 0807)   nitroGLYCERIN     PRN Meds: sodium chloride, acetaminophen, diazepam, ondansetron (ZOFRAN) IV   Vital Signs    Vitals:   04/13/21 1900 04/14/21 0027 04/14/21 0402 04/14/21 0727  BP: 107/69 117/79 114/72 119/76  Pulse: 64 65 62 63  Resp:  18 18   Temp:  98.1 F (36.7 C) 98.4 F (36.9 C) 98.1 F (36.7 C)  TempSrc:  Oral Oral Oral  SpO2: 97% 97% 97% 95%  Weight:   104.4 kg   Height:        Intake/Output Summary (Last 24 hours) at 04/14/2021 0941 Last data filed at 04/14/2021 0827 Gross per 24 hour  Intake 2133.49 ml  Output 1050 ml  Net 1083.49 ml   Last 3 Weights 04/14/2021 04/13/2021 04/13/2021  Weight (lbs) 230 lb 1.6 oz 228 lb 6.3 oz 228 lb 6.3 oz  Weight (kg) 104.373 kg 103.6 kg 103.6 kg      Telemetry    NSR - Personally Reviewed  ECG    No new tracing - Personally Reviewed  Physical Exam   GEN: No acute distress.  Laying in bed comfortably Neck: No JVD Cardiac: RRR, soft systolic murmur, no rubs  Respiratory: Clear to auscultation bilaterally. GI: Soft, nontender, non-distended  MS: No edema; No deformity. Right radial access site c/d/i Neuro:  Nonfocal  Psych: Normal affect   Labs    High Sensitivity Troponin:   Recent Labs  Lab 04/12/21 1155 04/12/21 1404  TROPONINIHS 10 9     Chemistry Recent Labs  Lab 04/12/21 1155  04/13/21 0342 04/14/21 0246  NA 138 136 135  K 3.8 3.5 3.3*  CL 104 102 105  CO2 25 25 21*  GLUCOSE 110* 101* 83  BUN 9 10 7*  CREATININE 1.18 0.86 0.71  CALCIUM 8.9 8.8* 7.7*  GFRNONAA >60 >60 >60  ANIONGAP 9 9 9     Lipids  Recent Labs  Lab 04/13/21 0342  CHOL 109  TRIG 119  HDL 32*  LDLCALC 53  CHOLHDL 3.4    Hematology Recent Labs  Lab 04/12/21 1155 04/13/21 0342 04/14/21 0246  WBC 7.5 7.6 7.9  RBC 3.91* 3.58* 3.17*  HGB 13.0 12.0* 10.5*  HCT 37.4* 34.2* 30.6*  MCV 95.7 95.5 96.5  MCH 33.2 33.5 33.1  MCHC 34.8 35.1 34.3  RDW 12.5 12.5 12.6  PLT 264 205 194   Thyroid No results for input(s): TSH, FREET4 in the last 168 hours.  BNPNo results for input(s): BNP, PROBNP in the last 168 hours.  DDimer No results for input(s): DDIMER in the last 168 hours.   Radiology    DG Chest 2 View  Result Date: 04/12/2021 CLINICAL DATA:  Chest pain EXAM: CHEST - 2 VIEW COMPARISON:  None FINDINGS: Heart size and mediastinal  contours appear normal. No pleural effusion or edema. No airspace densities identified. Postsurgical changes with resection of the distal left clavicle noted. Mild degenerative changes within the thoracic spine. IMPRESSION: No active cardiopulmonary abnormalities. Electronically Signed   By: Kerby Moors M.D.   On: 04/12/2021 13:05   CARDIAC CATHETERIZATION  Result Date: 04/13/2021   Ost LM to Dist LM lesion is 80% stenosed.   Prox LAD to Mid LAD lesion is 5% stenosed.   Prox LAD lesion is 30% stenosed.   1st Diag lesion is 90% stenosed.   Ost Cx to Prox Cx lesion is 5% stenosed.   2nd Mrg lesion is 15% stenosed.   Prox RCA lesion is 10% stenosed. Since the 2017 acute catheterization there is now severe 80% eccentric left main stenosis with calcification superiorly.  The proximal LAD stent is patent and the previously occluded jailed diagonal vessel is now a diminutive vessel.  There is 30% stenosis proximal to the stent and mild smooth 20% narrowing beyond  the stented segment. Non-obstructive mild circumflex and RCA stenosis with a dominant RCA and septal collateralization to the LAD. EF by echo today was 45%.  LVEDP is increased at 28 mm. RECOMMENDATION: Will heparinize 8 hours post TR band removal.  Will initiate nitrates and continue carvedilol.  Surgical consultation for CABG revascularization.   ECHOCARDIOGRAM COMPLETE  Result Date: 04/13/2021    ECHOCARDIOGRAM REPORT   Patient Name:   Dwayne [name] [facility] Date of Exam: 04/13/2021 Medical Rec #:  [id number]        Height:       70.0 in Accession #:    [id number]       Weight:       228.4 lb Date of Birth:  1956/02/16        BSA:          2.208 m Patient Age:    65 years         BP:           148/89 mmHg Patient Gender: M                HR:           65 bpm. Exam Location:  Inpatient Procedure: 2D Echo, Cardiac Doppler, Color Doppler and Intracardiac            Opacification Agent Indications:    Chest Pain  History:        Patient has prior history of Echocardiogram examinations, most                 recent 04/20/2016. Risk Factors:Family History of Coronary                 Artery Disease and Hypertension.  Sonographer:    Helmut Muster Referring Phys: [id number] [place]  1. Left ventricular ejection fraction, by estimation, is 45%. Left ventricular ejection fraction by 2D MOD biplane is 45.2 %. The left ventricle has mildly decreased function. The left ventricle demonstrates regional wall motion abnormalities (Apical hypokinesis without LV thrombus). Left ventricular diastolic parameters are indeterminate.  2. Right ventricular systolic function is normal. The right ventricular size is normal. Tricuspid regurgitation signal is inadequate for assessing PA pressure.  3. The mitral valve is grossly normal. Trivial mitral valve regurgitation.  4. The aortic valve is tricuspid. Aortic valve regurgitation is mild to moderate. No aortic stenosis is present.  5. Aortic dilatation noted. There is  mild dilatation of the aortic root, measuring  43 mm.  6. The inferior vena cava is normal in size with greater than 50% respiratory variability, suggesting right atrial pressure of 3 mmHg. Comparison(s): A prior study was performed on 04/20/2016. Aortic root has increased in size. FINDINGS  Left Ventricle: Left ventricular ejection fraction, by estimation, is 40 to 45%. The left ventricle has mildly decreased function. The left ventricle demonstrates regional wall motion abnormalities. Definity contrast agent was given IV to delineate the left ventricular endocardial borders. The left ventricular internal cavity size was normal in size. There is no left ventricular hypertrophy. Left ventricular diastolic parameters are indeterminate.  LV Wall Scoring: The entire apex is hypokinetic. Right Ventricle: The right ventricular size is normal. No increase in right ventricular wall thickness. Right ventricular systolic function is normal. Tricuspid regurgitation signal is inadequate for assessing PA pressure. Left Atrium: Left atrial size was normal in size. Right Atrium: Right atrial size was normal in size. Pericardium: There is no evidence of pericardial effusion. Mitral Valve: The mitral valve is grossly normal. Trivial mitral valve regurgitation. Tricuspid Valve: The tricuspid valve is normal in structure. Tricuspid valve regurgitation is trivial. No evidence of tricuspid stenosis. Aortic Valve: The aortic valve is tricuspid. Aortic valve regurgitation is mild to moderate. Aortic regurgitation PHT measures 754 msec. No aortic stenosis is present. Pulmonic Valve: The pulmonic valve was grossly normal. Pulmonic valve regurgitation is not visualized. No evidence of pulmonic stenosis. Aorta: Aortic dilatation noted. There is mild dilatation of the aortic root, measuring 43 mm. Venous: The inferior vena cava is normal in size with greater than 50% respiratory variability, suggesting right atrial pressure of 3 mmHg.  IAS/Shunts: The atrial septum is grossly normal.  LEFT VENTRICLE PLAX 2D LVIDd:         5.70 cm      Diastology LVIDs:         4.50 cm      LV e' medial:    9.11 cm/s LV PW:         1.00 cm      LV E/e' medial:  7.5 LV IVS:        0.90 cm      LV e' lateral:   9.20 cm/s LVOT diam:     2.10 cm      LV E/e' lateral: 7.5 LV SV:         53 LV SV Index:   24 LVOT Area:     3.46 cm  LV Volumes (MOD) LV vol d, MOD A2C: 165.0 ml LV vol d, MOD A4C: 150.0 ml LV vol s, MOD A2C: 92.9 ml LV vol s, MOD A4C: 81.8 ml LV SV MOD A2C:     72.1 ml LV SV MOD A4C:     150.0 ml LV SV MOD BP:      74.0 ml RIGHT VENTRICLE             IVC RV S prime:     14.90 cm/s  IVC diam: 2.10 cm TAPSE (M-mode): 2.4 cm LEFT ATRIUM           Index        RIGHT ATRIUM           Index LA diam:      3.10 cm 1.40 cm/m   RA Area:     14.10 cm LA Vol (A2C): 52.3 ml 23.68 ml/m  RA Volume:   35.00 ml  15.85 ml/m LA Vol (A4C): 49.2 ml 22.28 ml/m  AORTIC VALVE  PULMONIC VALVE LVOT Vmax:   80.00 cm/s  PR End Diast Vel: 5.02 msec LVOT Vmean:  51.400 cm/s LVOT VTI:    0.154 m AI PHT:      754 msec  AORTA Ao Root diam: 4.30 cm Ao Asc diam:  3.80 cm MITRAL VALVE MV Area (PHT): 2.39 cm    SHUNTS MV Decel Time: 317 msec    Systemic VTI:  0.15 m MV E velocity: 68.60 cm/s  Systemic Diam: 2.10 cm MV A velocity: 51.70 cm/s MV E/A ratio:  1.33 Rudean Haskell MD Electronically signed by Rudean Haskell MD Signature Date/Time: 04/13/2021/12:11:03 PM    Final     Cardiac Studies  Cath 04/13/21:   Colon Flattery LM to Dist LM lesion is 80% stenosed.   Prox LAD to Mid LAD lesion is 5% stenosed.   Prox LAD lesion is 30% stenosed.   1st Diag lesion is 90% stenosed.   Ost Cx to Prox Cx lesion is 5% stenosed.   2nd Mrg lesion is 15% stenosed.   Prox RCA lesion is 10% stenosed.   Since the 2017 acute catheterization there is now severe 80% eccentric left main stenosis with calcification superiorly.   The proximal LAD stent is patent and the previously  occluded jailed diagonal vessel is now a diminutive vessel.  There is 30% stenosis proximal to the stent and mild smooth 20% narrowing beyond the stented segment.    Non-obstructive mild circumflex and RCA stenosis with a dominant RCA and septal collateralization to the LAD.   EF by echo today was 45%.  LVEDP is increased at 28 mm.   RECOMMENDATION: Will heparinize 8 hours post TR band removal.  Will initiate nitrates and continue carvedilol.  Surgical consultation for CABG revascularization   TTE 04/13/21: IMPRESSIONS     1. Left ventricular ejection fraction, by estimation, is 45%. Left  ventricular ejection fraction by 2D MOD biplane is 45.2 %. The left  ventricle has mildly decreased function. The left ventricle demonstrates  regional wall motion abnormalities (Apical  hypokinesis without LV thrombus). Left ventricular diastolic parameters  are indeterminate.   2. Right ventricular systolic function is normal. The right ventricular  size is normal. Tricuspid regurgitation signal is inadequate for assessing  PA pressure.   3. The mitral valve is grossly normal. Trivial mitral valve  regurgitation.   4. The aortic valve is tricuspid. Aortic valve regurgitation is mild to  moderate. No aortic stenosis is present.   5. Aortic dilatation noted. There is mild dilatation of the aortic root,  measuring 43 mm.   6. The inferior vena cava is normal in size with greater than 50%  respiratory variability, suggesting right atrial pressure of 3 mmHg.   Transthoracic Echocardiogram: Date: 04/20/2016 Results: Study Conclusions   - Left ventricle: The cavity size was normal. Wall thickness was    normal. Systolic function was mildly to moderately reduced. The    estimated ejection fraction was in the range of 40% to 45%.    Diffuse hypokinesis. Doppler parameters are consistent with    abnormal left ventricular relaxation (grade 1 diastolic    dysfunction).  - Aortic valve: Trileaflet;  mildly thickened, mildly calcified    leaflets. There was trivial regurgitation.  - Mitral valve: There was mild regurgitation.    Carotid Vascular Duplex: Date: 01/23/2016 Results: Bilateral: mild mixed plaque origin ICA. Mild soft plaque proximal  ICA and ECA. Vertebral artery flow is antegrade. 1-39% ICA  plaquing. Left ECA velocities are elevated.  Left/Right Heart Catheterizations: Date: 01/20/2016 Results: Mid RCA lesion, 10 %stenosed. Ost 1st Mrg to 1st Mrg lesion, 20 %stenosed. 2nd Mrg lesion, 20 %stenosed. Mid Cx lesion, 10 %stenosed. Prox Cx to Mid Cx lesion, 10 %stenosed. Ost LM to LM lesion, 30 %stenosed. Ost 1st Diag to 1st Diag lesion, 100 %stenosed. A STENT PROMUS PREM MR 3.5X16 drug eluting stent was successfully placed. Prox LAD lesion, 100 %stenosed. Post intervention, there is a 0% residual stenosis. Mid LAD lesion, 20 %stenosed. There is moderate left ventricular systolic dysfunction. LV end diastolic pressure is mildly elevated. The left ventricular ejection fraction is 25-35% by visual estimate. There is no mitral valve regurgitation.   1. Acute anterior STEMI secondary to occluded proximal LAD 2. Successful PTCA/DES x 1 proximal to mid LAD 3. Mild disease in the left main, RCA and Circumflex 4. Moderate segmental LV systolic dysfunction   Recommendations: Will continue DAPT for one year with ASA and Brilinta. Will start beta blocker and change statin to Crestor. Will continue Ace-inh. Echo in before discharge.   Patient Profile     65 y.o. male with history of LAD STEMI s/p PCI in 2017, ischemic cardiomyopathy with LVEF 25-30% which improved to 40-45%, prior CVA with mild carotid disease, HTN and HLD who presented with chest pain with concern for UA. Cath revealed 80% distal LM now awaiting CABG evaluation.  Assessment & Plan    #Unstable Angina: #Left Main Disease: #History of STEMI s/p LAD PCI: Patient presents with exertional substernal chest  pressure that has been increasing in frequency over the past month in the setting of known CAD. Specifically, patient suffered LAD STEMI in 2017 s/p PCI with mild residual disease in LM, RCA, Lcx. Underwent cath on 04/13/21 which revealed interval progression of LM disease to 80%.  EF 45%.Now planned for CABG evaluation. -Consult CT surgery to evaluate for possible CABG for LM disease -Continue heparin gtt prior to OR -Will start nitro gtt given recurrent episodes of chest pain with minimal ambulation -Continue ASA 81mg  daily  -Continue coreg 25mg  BID -Plan for ACE/ARB post-surgery -Continue crestor 40mg  daily   #Ischemic CM with LVEF 40-45%: Patient with history of ischemic CM with initial EF 25-30% which improved to 40-45% on TTE in 2017. EF remained consistent on most recent TTE 04/13/21 at 45%. Currently compensated and euvolemic on exam.  -Continue home coreg 25mg  BID -Will add ACE/ARB post-CABG -Will add spiro/SGLT2i as tolerated   #HLD: LDL well controlled at 53, TG 119, HDL 32 -Continue home crestor 40mg  daily   #HTN: -Continue home coreg 25mg  BID -Will resume home ACE post-OR  #Aortic Aneurysm: Root measures 53mm. -Will need serial monitoring as out-patient  For questions or updates, please contact [place] Please consult [email or web address] for contact info under        Signed, Freada Bergeron, MD  04/14/2021, 9:41 AM

## 2021-04-14 ENCOUNTER — Other Ambulatory Visit (HOSPITAL_COMMUNITY): Payer: PPO

## 2021-04-14 ENCOUNTER — Encounter (HOSPITAL_COMMUNITY): Payer: Self-pay | Admitting: Cardiology

## 2021-04-14 DIAGNOSIS — I351 Nonrheumatic aortic (valve) insufficiency: Secondary | ICD-10-CM | POA: Diagnosis not present

## 2021-04-14 DIAGNOSIS — I255 Ischemic cardiomyopathy: Secondary | ICD-10-CM | POA: Diagnosis not present

## 2021-04-14 DIAGNOSIS — I25110 Atherosclerotic heart disease of native coronary artery with unstable angina pectoris: Secondary | ICD-10-CM | POA: Diagnosis not present

## 2021-04-14 DIAGNOSIS — I10 Essential (primary) hypertension: Secondary | ICD-10-CM | POA: Diagnosis not present

## 2021-04-14 DIAGNOSIS — I200 Unstable angina: Secondary | ICD-10-CM | POA: Diagnosis not present

## 2021-04-14 LAB — CBC
HCT: 30.6 % — ABNORMAL LOW (ref 39.0–52.0)
Hemoglobin: 10.5 g/dL — ABNORMAL LOW (ref 13.0–17.0)
MCH: 33.1 pg (ref 26.0–34.0)
MCHC: 34.3 g/dL (ref 30.0–36.0)
MCV: 96.5 fL (ref 80.0–100.0)
Platelets: 194 10*3/uL (ref 150–400)
RBC: 3.17 MIL/uL — ABNORMAL LOW (ref 4.22–5.81)
RDW: 12.6 % (ref 11.5–15.5)
WBC: 7.9 10*3/uL (ref 4.0–10.5)
nRBC: 0 % (ref 0.0–0.2)

## 2021-04-14 LAB — BASIC METABOLIC PANEL
Anion gap: 9 (ref 5–15)
BUN: 7 mg/dL — ABNORMAL LOW (ref 8–23)
CO2: 21 mmol/L — ABNORMAL LOW (ref 22–32)
Calcium: 7.7 mg/dL — ABNORMAL LOW (ref 8.9–10.3)
Chloride: 105 mmol/L (ref 98–111)
Creatinine, Ser: 0.71 mg/dL (ref 0.61–1.24)
GFR, Estimated: >60 mL/min (ref >60)
Glucose, Bld: 83 mg/dL (ref 70–99)
Potassium: 3.3 mmol/L — ABNORMAL LOW (ref 3.5–5.1)
Sodium: 135 mmol/L (ref 135–145)

## 2021-04-14 LAB — SURGICAL PCR SCREEN
MRSA, PCR: NEGATIVE
Staphylococcus aureus: NEGATIVE

## 2021-04-14 LAB — HEPARIN LEVEL (UNFRACTIONATED): Heparin Unfractionated: 0.26 IU/mL — ABNORMAL LOW (ref 0.30–0.70)

## 2021-04-14 MED ORDER — TRAMADOL HCL 50 MG PO TABS
50.0000 mg | ORAL_TABLET | Freq: Once | ORAL | Status: AC
  Administered 2021-04-14: 50 mg via ORAL
  Filled 2021-04-14: qty 1

## 2021-04-14 MED ORDER — NITROGLYCERIN IN D5W 200-5 MCG/ML-% IV SOLN
0.0000 ug/min | INTRAVENOUS | Status: DC
  Administered 2021-04-14: 5 ug/min via INTRAVENOUS
  Administered 2021-04-15: 15 ug/min via INTRAVENOUS
  Filled 2021-04-14 (×2): qty 250

## 2021-04-14 MED ORDER — NITROGLYCERIN 0.4 MG SL SUBL
SUBLINGUAL_TABLET | SUBLINGUAL | Status: AC
  Filled 2021-04-14: qty 3

## 2021-04-14 NOTE — Progress Notes (Signed)
Pt ambulating to bathroom and had episode of chest tightness.  SL nitro administered.  EKG to be obtained.  Will continue to monitor.

## 2021-04-14 NOTE — Plan of Care (Signed)
  Problem: Clinical Measurements: Goal: Will remain free from infection Outcome: Progressing Goal: Respiratory complications will improve Outcome: Progressing Goal: Cardiovascular complication will be avoided Outcome: Progressing   

## 2021-04-14 NOTE — Progress Notes (Signed)
   Notified by RN that patient having constant headache. Likely from Nitro drip. Tylenol is not helping. Discussed with Dr. Johney Frame and will order dose of Tramadol to see if that helps.  Darreld Mclean, PA-C 04/14/2021 5:07 PM

## 2021-04-14 NOTE — Progress Notes (Addendum)
Heart Failure Nurse Navigator Progress Note  Following this admission for HV TOC readiness. CVTS consult pending for possible CABG.    Will continue to follow and available for education as needed. Can reassess for HV TOC needs if appropriate.   Pricilla Holm, MSN, RN Heart Failure Nurse Navigator [phone]

## 2021-04-14 NOTE — Plan of Care (Signed)
  Problem: Safety: Goal: Ability to remain free from injury will improve Outcome: Progressing   

## 2021-04-14 NOTE — Progress Notes (Signed)
IV NTG started as ordered.  VSS.  No complaints of c/p or dizziness at this time.

## 2021-04-14 NOTE — Progress Notes (Signed)
Progress Note  Patient Name: Dwayne [name] Date of Encounter: 04/15/2021  [facility] Cardiologist: Mertie Moores, MD   Subjective   Had episodes of chest discomfort overnight requiring escalating dose of nitro. Pain resolved this AM but having HA and nausea. Plan for OR today for CABG +/- AVR  K low at 3.3; K+ ordered  Inpatient Medications    Scheduled Meds:  aspirin  81 mg Oral Daily   bisacodyl  5 mg Oral Once   carvedilol  25 mg Oral BID WC   chlorhexidine  15 mL Mouth/Throat Once   Chlorhexidine Gluconate Cloth  6 each Topical Once   epinephrine  0-10 mcg/min Intravenous To OR   heparin-papaverine-plasmalyte irrigation   Irrigation To OR   insulin   Intravenous To OR   [facility] Blood Cardioplegia vial (lidocaine/magnesium/mannitol 0.26g-4g-6.4g)   Intracoronary To OR   [START ON 04/16/2021] metoprolol tartrate  12.5 mg Oral Once   phenylephrine  30-200 mcg/min Intravenous To OR   potassium chloride  80 mEq Other To OR   rosuvastatin  40 mg Oral Daily   sodium chloride flush  3 mL Intravenous Q12H   tranexamic acid  15 mg/kg Intravenous To OR   tranexamic acid  2 mg/kg Intracatheter To OR   Continuous Infusions:  sodium chloride      ceFAZolin (ANCEF) IV      ceFAZolin (ANCEF) IV     dexmedetomidine     heparin 30,000 units/NS 1000 mL solution for CELLSAVER     heparin 1,450 Units/hr (04/15/21 0336)   milrinone     nitroGLYCERIN 15 mcg/min (04/15/21 0055)   nitroGLYCERIN     norepinephrine     tranexamic acid (CYKLOKAPRON) infusion ([facility])     vancomycin     PRN Meds: sodium chloride, acetaminophen, diazepam, ondansetron (ZOFRAN) IV   Vital Signs    Vitals:   04/14/21 1700 04/14/21 1936 04/15/21 0000 04/15/21 0634  BP: 135/82 124/76 (!) 107/58 (!) 149/97  Pulse:  69 74 84  Resp:  18 18 18   Temp:  98.6 F (37 C) 98.7 F (37.1 C) 98.3 F (36.8 C)  TempSrc:  Oral Oral Oral  SpO2:  98% 97% 96%  Weight:    101.6 kg  Height:         Intake/Output Summary (Last 24 hours) at 04/15/2021 0820 Last data filed at 04/15/2021 0336 Gross per 24 hour  Intake 682.56 ml  Output 775 ml  Net -92.44 ml   Last 3 Weights 04/15/2021 04/14/2021 04/13/2021  Weight (lbs) 224 lb 230 lb 1.6 oz 228 lb 6.3 oz  Weight (kg) 101.606 kg 104.373 kg 103.6 kg      Telemetry    NSR - Personally Reviewed  ECG    No new tracing- Personally Reviewed  Physical Exam   GEN: No acute distress.  Laying in bed comfortably Neck: No JVD Cardiac: RRR, soft systolic murmur, no rubs  Respiratory: Clear to auscultation bilaterally. GI: Soft, nontender, non-distended  MS: No edema; No deformity. Right radial access site c/d/i Neuro:  Nonfocal  Psych: Normal affect   Labs    High Sensitivity Troponin:   Recent Labs  Lab 04/12/21 1155 04/12/21 1404  TROPONINIHS 10 9     Chemistry Recent Labs  Lab 04/13/21 0342 04/14/21 0246 04/15/21 0135  NA 136 135 136  K 3.5 3.3* 3.3*  CL 102 105 103  CO2 25 21* 23  GLUCOSE 101* 83 95  BUN 10 7* <5*  CREATININE 0.86 0.71 0.74  CALCIUM 8.8* 7.7* 8.7*  GFRNONAA >60 >60 >60  ANIONGAP 9 9 10     Lipids  Recent Labs  Lab 04/13/21 0342  CHOL 109  TRIG 119  HDL 32*  LDLCALC 53  CHOLHDL 3.4    Hematology Recent Labs  Lab 04/13/21 0342 04/14/21 0246 04/15/21 0135  WBC 7.6 7.9 5.9  RBC 3.58* 3.17* 3.35*  HGB 12.0* 10.5* 11.2*  HCT 34.2* 30.6* 32.0*  MCV 95.5 96.5 95.5  MCH 33.5 33.1 33.4  MCHC 35.1 34.3 35.0  RDW 12.5 12.6 12.2  PLT 205 194 177   Thyroid No results for input(s): TSH, FREET4 in the last 168 hours.  BNPNo results for input(s): BNP, PROBNP in the last 168 hours.  DDimer No results for input(s): DDIMER in the last 168 hours.   Radiology    CARDIAC CATHETERIZATION  Result Date: 04/13/2021   Ost LM to Dist LM lesion is 80% stenosed.   Prox LAD to Mid LAD lesion is 5% stenosed.   Prox LAD lesion is 30% stenosed.   1st Diag lesion is 90% stenosed.   Ost Cx to Prox  Cx lesion is 5% stenosed.   2nd Mrg lesion is 15% stenosed.   Prox RCA lesion is 10% stenosed. Since the 2017 acute catheterization there is now severe 80% eccentric left main stenosis with calcification superiorly.  The proximal LAD stent is patent and the previously occluded jailed diagonal vessel is now a diminutive vessel.  There is 30% stenosis proximal to the stent and mild smooth 20% narrowing beyond the stented segment. Non-obstructive mild circumflex and RCA stenosis with a dominant RCA and septal collateralization to the LAD. EF by echo today was 45%.  LVEDP is increased at 28 mm. RECOMMENDATION: Will heparinize 8 hours post TR band removal.  Will initiate nitrates and continue carvedilol.  Surgical consultation for CABG revascularization.   ECHOCARDIOGRAM COMPLETE  Result Date: 04/13/2021    ECHOCARDIOGRAM REPORT   Patient Name:   Dwayne [name] [facility] Date of Exam: 04/13/2021 Medical Rec #:  [id number]        Height:       70.0 in Accession #:    [id number]       Weight:       228.4 lb Date of Birth:  1956/04/23        BSA:          2.208 m Patient Age:    65 years         BP:           148/89 mmHg Patient Gender: M                HR:           65 bpm. Exam Location:  Inpatient Procedure: 2D Echo, Cardiac Doppler, Color Doppler and Intracardiac            Opacification Agent Indications:    Chest Pain  History:        Patient has prior history of Echocardiogram examinations, most                 recent 04/20/2016. Risk Factors:Family History of Coronary                 Artery Disease and Hypertension.  Sonographer:    Helmut Muster Referring Phys: [id number] [place]  1. Left ventricular ejection fraction, by estimation, is 45%. Left ventricular ejection fraction by 2D MOD biplane  is 45.2 %. The left ventricle has mildly decreased function. The left ventricle demonstrates regional wall motion abnormalities (Apical hypokinesis without LV thrombus). Left ventricular diastolic  parameters are indeterminate.  2. Right ventricular systolic function is normal. The right ventricular size is normal. Tricuspid regurgitation signal is inadequate for assessing PA pressure.  3. The mitral valve is grossly normal. Trivial mitral valve regurgitation.  4. The aortic valve is tricuspid. Aortic valve regurgitation is mild to moderate. No aortic stenosis is present.  5. Aortic dilatation noted. There is mild dilatation of the aortic root, measuring 43 mm.  6. The inferior vena cava is normal in size with greater than 50% respiratory variability, suggesting right atrial pressure of 3 mmHg. Comparison(s): A prior study was performed on 04/20/2016. Aortic root has increased in size. FINDINGS  Left Ventricle: Left ventricular ejection fraction, by estimation, is 40 to 45%. The left ventricle has mildly decreased function. The left ventricle demonstrates regional wall motion abnormalities. Definity contrast agent was given IV to delineate the left ventricular endocardial borders. The left ventricular internal cavity size was normal in size. There is no left ventricular hypertrophy. Left ventricular diastolic parameters are indeterminate.  LV Wall Scoring: The entire apex is hypokinetic. Right Ventricle: The right ventricular size is normal. No increase in right ventricular wall thickness. Right ventricular systolic function is normal. Tricuspid regurgitation signal is inadequate for assessing PA pressure. Left Atrium: Left atrial size was normal in size. Right Atrium: Right atrial size was normal in size. Pericardium: There is no evidence of pericardial effusion. Mitral Valve: The mitral valve is grossly normal. Trivial mitral valve regurgitation. Tricuspid Valve: The tricuspid valve is normal in structure. Tricuspid valve regurgitation is trivial. No evidence of tricuspid stenosis. Aortic Valve: The aortic valve is tricuspid. Aortic valve regurgitation is mild to moderate. Aortic regurgitation PHT measures  754 msec. No aortic stenosis is present. Pulmonic Valve: The pulmonic valve was grossly normal. Pulmonic valve regurgitation is not visualized. No evidence of pulmonic stenosis. Aorta: Aortic dilatation noted. There is mild dilatation of the aortic root, measuring 43 mm. Venous: The inferior vena cava is normal in size with greater than 50% respiratory variability, suggesting right atrial pressure of 3 mmHg. IAS/Shunts: The atrial septum is grossly normal.  LEFT VENTRICLE PLAX 2D LVIDd:         5.70 cm      Diastology LVIDs:         4.50 cm      LV e' medial:    9.11 cm/s LV PW:         1.00 cm      LV E/e' medial:  7.5 LV IVS:        0.90 cm      LV e' lateral:   9.20 cm/s LVOT diam:     2.10 cm      LV E/e' lateral: 7.5 LV SV:         53 LV SV Index:   24 LVOT Area:     3.46 cm  LV Volumes (MOD) LV vol d, MOD A2C: 165.0 ml LV vol d, MOD A4C: 150.0 ml LV vol s, MOD A2C: 92.9 ml LV vol s, MOD A4C: 81.8 ml LV SV MOD A2C:     72.1 ml LV SV MOD A4C:     150.0 ml LV SV MOD BP:      74.0 ml RIGHT VENTRICLE             IVC RV S prime:  14.90 cm/s  IVC diam: 2.10 cm TAPSE (M-mode): 2.4 cm LEFT ATRIUM           Index        RIGHT ATRIUM           Index LA diam:      3.10 cm 1.40 cm/m   RA Area:     14.10 cm LA Vol (A2C): 52.3 ml 23.68 ml/m  RA Volume:   35.00 ml  15.85 ml/m LA Vol (A4C): 49.2 ml 22.28 ml/m  AORTIC VALVE             PULMONIC VALVE LVOT Vmax:   80.00 cm/s  PR End Diast Vel: 5.02 msec LVOT Vmean:  51.400 cm/s LVOT VTI:    0.154 m AI PHT:      754 msec  AORTA Ao Root diam: 4.30 cm Ao Asc diam:  3.80 cm MITRAL VALVE MV Area (PHT): 2.39 cm    SHUNTS MV Decel Time: 317 msec    Systemic VTI:  0.15 m MV E velocity: 68.60 cm/s  Systemic Diam: 2.10 cm MV A velocity: 51.70 cm/s MV E/A ratio:  1.33 Rudean Haskell MD Electronically signed by Rudean Haskell MD Signature Date/Time: 04/13/2021/12:11:03 PM    Final     Cardiac Studies  Cath 04/13/21:   Colon Flattery LM to Dist LM lesion is 80% stenosed.    Prox LAD to Mid LAD lesion is 5% stenosed.   Prox LAD lesion is 30% stenosed.   1st Diag lesion is 90% stenosed.   Ost Cx to Prox Cx lesion is 5% stenosed.   2nd Mrg lesion is 15% stenosed.   Prox RCA lesion is 10% stenosed.   Since the 2017 acute catheterization there is now severe 80% eccentric left main stenosis with calcification superiorly.   The proximal LAD stent is patent and the previously occluded jailed diagonal vessel is now a diminutive vessel.  There is 30% stenosis proximal to the stent and mild smooth 20% narrowing beyond the stented segment.    Non-obstructive mild circumflex and RCA stenosis with a dominant RCA and septal collateralization to the LAD.   EF by echo today was 45%.  LVEDP is increased at 28 mm.   RECOMMENDATION: Will heparinize 8 hours post TR band removal.  Will initiate nitrates and continue carvedilol.  Surgical consultation for CABG revascularization   TTE 04/13/21: IMPRESSIONS     1. Left ventricular ejection fraction, by estimation, is 45%. Left  ventricular ejection fraction by 2D MOD biplane is 45.2 %. The left  ventricle has mildly decreased function. The left ventricle demonstrates  regional wall motion abnormalities (Apical  hypokinesis without LV thrombus). Left ventricular diastolic parameters  are indeterminate.   2. Right ventricular systolic function is normal. The right ventricular  size is normal. Tricuspid regurgitation signal is inadequate for assessing  PA pressure.   3. The mitral valve is grossly normal. Trivial mitral valve  regurgitation.   4. The aortic valve is tricuspid. Aortic valve regurgitation is mild to  moderate. No aortic stenosis is present.   5. Aortic dilatation noted. There is mild dilatation of the aortic root,  measuring 43 mm.   6. The inferior vena cava is normal in size with greater than 50%  respiratory variability, suggesting right atrial pressure of 3 mmHg.   Transthoracic Echocardiogram: Date:  04/20/2016 Results: Study Conclusions   - Left ventricle: The cavity size was normal. Wall thickness was    normal. Systolic function was mildly to  moderately reduced. The    estimated ejection fraction was in the range of 40% to 45%.    Diffuse hypokinesis. Doppler parameters are consistent with    abnormal left ventricular relaxation (grade 1 diastolic    dysfunction).  - Aortic valve: Trileaflet; mildly thickened, mildly calcified    leaflets. There was trivial regurgitation.  - Mitral valve: There was mild regurgitation.    Carotid Vascular Duplex: Date: 01/23/2016 Results: Bilateral: mild mixed plaque origin ICA. Mild soft plaque proximal  ICA and ECA. Vertebral artery flow is antegrade. 1-39% ICA  plaquing. Left ECA velocities are elevated.    Left/Right Heart Catheterizations: Date: 01/20/2016 Results: Mid RCA lesion, 10 %stenosed. Ost 1st Mrg to 1st Mrg lesion, 20 %stenosed. 2nd Mrg lesion, 20 %stenosed. Mid Cx lesion, 10 %stenosed. Prox Cx to Mid Cx lesion, 10 %stenosed. Ost LM to LM lesion, 30 %stenosed. Ost 1st Diag to 1st Diag lesion, 100 %stenosed. A STENT PROMUS PREM MR 3.5X16 drug eluting stent was successfully placed. Prox LAD lesion, 100 %stenosed. Post intervention, there is a 0% residual stenosis. Mid LAD lesion, 20 %stenosed. There is moderate left ventricular systolic dysfunction. LV end diastolic pressure is mildly elevated. The left ventricular ejection fraction is 25-35% by visual estimate. There is no mitral valve regurgitation.   1. Acute anterior STEMI secondary to occluded proximal LAD 2. Successful PTCA/DES x 1 proximal to mid LAD 3. Mild disease in the left main, RCA and Circumflex 4. Moderate segmental LV systolic dysfunction   Recommendations: Will continue DAPT for one year with ASA and Brilinta. Will start beta blocker and change statin to Crestor. Will continue Ace-inh. Echo in before discharge.   Patient Profile     65 y.o. male with  history of LAD STEMI s/p PCI in 2017, ischemic cardiomyopathy with LVEF 25-30% which improved to 40-45%, prior CVA with mild carotid disease, HTN and HLD who presented with chest pain with concern for UA. Cath revealed 80% distal LM now awaiting CABG evaluation.  Assessment & Plan    #Unstable Angina: #Left Main Disease: #History of STEMI s/p LAD PCI: Patient presents with exertional substernal chest pressure that has been increasing in frequency over the past month in the setting of known CAD. Specifically, patient suffered LAD STEMI in 2017 s/p PCI with mild residual disease in LM, RCA, Lcx. Underwent cath on 04/13/21 which revealed interval progression of LM disease to 80%.  EF 45%.Now planned for CABG. -Plan for CABG today -Continue heparin gtt prior to OR -Continue nitro gtt -Continue ASA 81mg  daily  -Continue coreg 25mg  BID -Plan for ACE/ARB post-surgery -Continue crestor 40mg  daily   #Ischemic CM with LVEF 40-45%: Patient with history of ischemic CM with initial EF 25-30% which improved to 40-45% on TTE in 2017. EF remained consistent on most recent TTE 04/13/21 at 45%. Currently compensated and euvolemic on exam.  -Continue home coreg 25mg  BID -Will add ACE/ARB post-CABG -Will add spiro/SGLT2i as tolerated  #Mild-to-Moderate AR: -Possible AVR in surgery   #HLD: LDL well controlled at 53, TG 119, HDL 32 -Continue home crestor 40mg  daily   #HTN: -Continue home coreg 25mg  BID -Will resume home ACE post-OR  #Aortic Aneurysm: Root measures 28mm. -Will need serial monitoring as out-patient  For questions or updates, please contact [place] Please consult [email or web address] for contact info under        Signed, Freada Bergeron, MD  04/15/2021, 8:20 AM

## 2021-04-14 NOTE — Consult Note (Addendum)
Dwayne [name]       [place] [address]             [phone]        Dwayne [name] [facility] Medical Record #[id number] Date of Birth: 1955-07-15  Referring: No ref. provider found Primary Care: Dwayne Arabian, MD Primary Cardiologist:Dwayne Nahser, MD  Chief Complaint:   Severe coronary artery disease History of Present Illness:    Patient is a 65 year old male with a well-known history of coronary artery disease having had a previous STEMI in 2017 involving the LAD with known ischemic cardiomyopathy.  Initial ejection fraction was in the 25 to 30% but it did improve to 40 to 45% following PCI.  He presented 2 days ago to the emergency department with chest pain.  He was last seen by cardiology on 03/17/2021 where it was felt that he was doing well and was compensated from a heart failure perspective with no significant ischemic symptoms.  The patient did report an episode of chest pain approximately 1 month ago described as a substernal chest pressure while packing his car from [place].  It resolved with rest and sublingual nitroglycerin after approximately 10 minutes.  Since that time he reports intermittent substernal chest tightness that resolves with rest.  None of the episodes were felt to be severe in nature however on the date of presentation while taking the trash out the episode was felt to be much more significant in terms of the chest tightness.  He took 1 sublingual nitroglycerin and showed improvement but decided to come to the ER for further evaluation.  Initial EKG showed sinus rhythm with no ischemic changes.  Initial high-sensitivity troponin was 10 and subsequent was 9.  As the patient is known to have residual disease in left main, RCA and circumflex following his previous PCI of the LAD and exertional symptoms it was felt that he should undergo cardiac catheterization.  Additional cardiac risk factors include erectile dysfunction, history of CVA,  hypercholesterolemia as well as hypertension.  Echocardiogram showed the LVEF to be 45%.  Please see full report listed below.  Cardiac catheterization revealed significant left main and multivessel coronary artery disease.  Please see the full report listed below.  We are asked to see the patient in cardiothoracic surgical consultation for consideration of revascularization.    Current Activity/ Functional Status: Patient is independent with mobility/ambulation, transfers, ADL's, IADL's.   Zubrod Score: At the time of surgery this patient's most appropriate activity status/level should be described as: []     0    Normal activity, no symptoms []     1    Restricted in physical strenuous activity but ambulatory, able to do out light work []     2    Ambulatory and capable of self care, unable to do work activities, up and about                 more than 50%  Of the time                            []     3    Only limited self care, in bed greater than 50% of waking hours []     4    Completely disabled, no self care, confined to bed or chair []     5    Moribund  Past Medical History:  Diagnosis Date   Acute ST  elevation myocardial infarction (STEMI) involving left anterior descending (LAD) coronary artery (HCC)    Adenomatous colon polyp 2000   Arthritis    Coronary artery disease involving native coronary artery of native heart without angina pectoris 03/23/2016   Diverticulosis    Erectile dysfunction    History of stroke 01/23/2017   Hypercholesteremia    Hypertension    Internal hemorrhoids    Kidney stones    Myocardial infarction [facility])    Occipital cerebral infarction ([place]) 03/23/2016   Splenomegaly    Denies History of CVA or diverticulosis* Past Surgical History:  Procedure Laterality Date   CARDIAC CATHETERIZATION N/A 01/20/2016   Procedure: Left Heart Cath and Coronary Angiography;  Surgeon: Dwayne Blanks, MD; LAD 100%, D1 100%, multi-vessel dz 10-30% in other  vessels, EF 25-35%, elevated LVEDP   CARDIAC CATHETERIZATION N/A 01/20/2016   Procedure: Coronary Stent Intervention;  Surgeon: Dwayne Blanks, MD; PROMUS PREM MR 3.5X16 mm DES LAD, D1 jailed by the stent but was already occluded   CHOLECYSTECTOMY  2009   COLONOSCOPY     EYE SURGERY     Age 64   LEFT HEART CATH AND CORONARY ANGIOGRAPHY N/A 04/13/2021   Procedure: LEFT HEART CATH AND CORONARY ANGIOGRAPHY;  Surgeon: Dwayne Sine, MD;  Location: [facility];  Service: Cardiovascular;  Laterality: N/A;   [place]  2008   SHOULDER SURGERY  1998   Left shoulder fracture-clavicle   WRIST FRACTURE SURGERY  1995   Bilateral  Right hip replacement [facility]-Dr Dwayne [name]  Social History   Tobacco Use  Smoking Status Never  Smokeless Tobacco Never    Social History   Substance and Sexual Activity  Alcohol Use Yes   Alcohol/week: 0.0 standard drinks   Comment: 2+ per week     Allergies  Allergen Reactions   Codeine Nausea Only   Lipitor [Atorvastatin] Other (See Comments)    Myalgia    Current Facility-Administered Medications  Medication Dose Route Frequency Provider Last Rate Last Admin   0.9 %  sodium chloride infusion  250 mL Intravenous PRN Dwayne Sine, MD       acetaminophen (TYLENOL) tablet 650 mg  650 mg Oral Q4H PRN Dwayne Sine, MD   650 mg at 04/14/21 0802   aspirin chewable tablet 81 mg  81 mg Oral Daily Dwayne Sine, MD   81 mg at 04/14/21 0802   carvedilol (COREG) tablet 25 mg  25 mg Oral BID WC Dwayne Bergeron, MD   25 mg at 04/14/21 0802   diazepam (VALIUM) tablet 5 mg  5 mg Oral Q4H PRN Dwayne Sine, MD       heparin ADULT infusion 100 units/mL (25000 units/281mL)  1,250 Units/hr Intravenous Continuous Dwayne [name], RPH 12.5 mL/hr at 04/14/21 0807 1,250 Units/hr at 04/14/21 0807   nitroGLYCERIN (NITROSTAT) 0.4 MG SL tablet            nitroGLYCERIN 50 mg in dextrose 5 % 250 mL (0.2  mg/mL) infusion  0-200 mcg/min Intravenous Titrated Dwayne Bergeron, MD 1.5 mL/hr at 04/14/21 0936 5 mcg/min at 04/14/21 0936   ondansetron (ZOFRAN) injection 4 mg  4 mg Intravenous Q6H PRN Dwayne Sine, MD       rosuvastatin (CRESTOR) tablet 40 mg  40 mg Oral Daily Dwayne Bergeron, MD   40 mg at 04/14/21 0802   sodium chloride flush (NS) 0.9 % injection  3 mL  3 mL Intravenous Q12H Dwayne Sine, MD   3 mL at 04/14/21 0802    Medications Prior to Admission  Medication Sig Dispense Refill Last Dose   AMBULATORY NON FORMULARY MEDICATION Apply 1 application topically daily as needed (pain). Medication Name: CBD Oil - daily   Past Month   aspirin EC 81 MG tablet Take by mouth.   04/12/2021   carvedilol (COREG) 25 MG tablet TAKE 1 TABLET BY MOUTH 2 (TWO) TIMES DAILY. PLEASE KEEP UPCOMING APPT IN [facility] 2022 180 tablet 3 04/12/2021 at 0545   lisinopril (ZESTRIL) 5 MG tablet TAKE 1 TABLET BY MOUTH EVERY DAY 90 tablet 3 04/12/2021   Multiple Vitamin (MULTIVITAMIN WITH MINERALS) TABS tablet Take 1 tablet by mouth daily.   Past Week   nitroGLYCERIN (NITROSTAT) 0.4 MG SL tablet Place 1 tablet (0.4 mg total) under the tongue every 5 (five) minutes as needed for chest pain. 25 tablet 3 04/12/2021 at 1030   ondansetron (ZOFRAN) 4 MG tablet Take 4 mg by mouth every 8 (eight) hours as needed for nausea/vomiting, nausea or vomiting.   unknown   oxyCODONE (OXY IR/ROXICODONE) 5 MG immediate release tablet Take 5 mg by mouth every 6 (six) hours as needed for pain or moderate pain.   04/11/2021   rosuvastatin (CRESTOR) 40 MG tablet Take 40 mg by mouth daily.   04/12/2021   terbinafine (LAMISIL) 250 MG tablet Take 1 tablet (250 mg total) by mouth daily. (Patient taking differently: Take 250 mg by mouth every evening.) 90 tablet 0 04/11/2021   tiZANidine (ZANAFLEX) 2 MG tablet Take 4 mg by mouth every 8 (eight) hours as needed for muscle spasms.   unknown   aspirin 81 MG chewable tablet Chew 1 tablet (81  mg total) by mouth daily. (Patient not taking: No sig reported)   Completed Course    Family History  Problem Relation Age of Onset   Heart disease Father    CVA Father    CVA Brother    CVA Brother    CVA Brother    Colon polyps Neg Hx    Colon cancer Neg Hx    Esophageal cancer Neg Hx    Stomach cancer Neg Hx    Pancreatic cancer Neg Hx    Liver disease Neg Hx      Review of Systems:         Cardiac Review of Systems: Y or  [    ]= no  Chest Pain [  y  ]  Resting SOB [  n ] Exertional SOB  [ y ]  Orthopnea [  n]   Pedal Edema [ n  ]    Palpitations [ y ] Syncope  [ n ]   Presyncope [   ]  General Review of Systems: [Y] = yes [  ]=no Constitional: recent weight change [ y ]; anorexia [  y]; fatigue [ y ]; nausea [ y ]; night sweats [ n ]; fever [ n ]; or chills [  ]              y                                                 Dental: Last Dentist visit:   Eye : blurred vision [ n ]; diplopia [  n  ]; vision changes [  n];  Amaurosis fugax[ n ]; Resp: cough [ n ];  wheezing[  n];  hemoptysis[  n]; shortness of breath[ y ]; paroxysmal nocturnal dyspnea[ n ]; dyspnea on exertion[ y ]; or orthopnea[n  ];  GI:  gallstones[ y ], vomiting[  n];  dysphagia[ n ]; melena[ n ];  hematochezia [  n]; heartburn[ y];   Hx of  Colonoscopy[ y ]; GU: kidney stones [ y ]; hematuria[  n];   dysuria [  n];  nocturia[  n];  history of     obstruction [ n ]; urinary frequency [ y ]             Skin: rash, swelling[ n ];, hair loss[  ];  peripheral edema[  n];  or itching[ n ]; Musculosketetal: myalgias[n  ];  joint swelling[  n];  joint erythema[  n];  joint pain[ n ];  back pain[ y ];  Heme/Lymph: bruising[ y ];  bleeding[ n ];  anemia[  n];  Neuro: TIA[ n ];  headaches[ n ];  stroke[  ];  vertigo[ n ];  seizures[  n];   paresthesias[ n ];  difficulty walking[  n];  Psych:depression[ n ]; anxiety[ n ];  Endocrine: diabetes[ n ];  thyroid dysfunction[ n ];             Has upper and lower  dentures                  Physical Exam: BP 117/75 (BP Location: Right Arm)   Pulse 63   Temp 98.1 F (36.7 C) (Oral)   Resp 18   Ht 5\' 10"  (1.778 m)   Wt 104.4 kg Comment: scale b  SpO2 95%   BMI 33.02 kg/m    General appearance: alert, cooperative, and no distress Head: Normocephalic, without obvious abnormality, atraumatic Neck: no adenopathy, no carotid bruit, no JVD, supple, symmetrical, trachea midline, and thyroid not enlarged, symmetric, no tenderness/mass/nodules Lymph nodes: Cervical, supraclavicular, and axillary nodes normal. Resp: clear to auscultation bilaterally Back: symmetric, no curvature. ROM normal. No CVA tenderness. Cardio: regular rate and rhythm, S1, S2 normal, no murmur, click, rub or gallop GI: soft, non-tender; bowel sounds normal; no masses,  no organomegaly Extremities: extremities normal, atraumatic, no cyanosis or edema Neurologic: Grossly normal No carotid bruits, peripheral pulses intact   Diagnostic Studies & Laboratory data:     Recent Radiology Findings:   DG Chest 2 View  Result Date: 04/12/2021 CLINICAL DATA:  Chest pain EXAM: CHEST - 2 VIEW COMPARISON:  None FINDINGS: Heart size and mediastinal contours appear normal. No pleural effusion or edema. No airspace densities identified. Postsurgical changes with resection of the distal left clavicle noted. Mild degenerative changes within the thoracic spine. IMPRESSION: No active cardiopulmonary abnormalities. Electronically Signed   By: Kerby Moors M.D.   On: 04/12/2021 13:05   CARDIAC CATHETERIZATION  Result Date: 04/13/2021   Ost LM to Dist LM lesion is 80% stenosed.   Prox LAD to Mid LAD lesion is 5% stenosed.   Prox LAD lesion is 30% stenosed.   1st Diag lesion is 90% stenosed.   Ost Cx to Prox Cx lesion is 5% stenosed.   2nd Mrg lesion is 15% stenosed.   Prox RCA lesion is 10% stenosed. Since the 2017 acute catheterization there is now severe 80% eccentric left main stenosis with  calcification superiorly.  The proximal LAD stent is patent and the previously occluded  jailed diagonal vessel is now a diminutive vessel.  There is 30% stenosis proximal to the stent and mild smooth 20% narrowing beyond the stented segment. Non-obstructive mild circumflex and RCA stenosis with a dominant RCA and septal collateralization to the LAD. EF by echo today was 45%.  LVEDP is increased at 28 mm. RECOMMENDATION: Will heparinize 8 hours post TR band removal.  Will initiate nitrates and continue carvedilol.  Surgical consultation for CABG revascularization.   ECHOCARDIOGRAM COMPLETE  Result Date: 04/13/2021    ECHOCARDIOGRAM REPORT   Patient Name:   Dwayne [name] [facility] Date of Exam: 04/13/2021 Medical Rec #:  [id number]        Height:       70.0 in Accession #:    [id number]       Weight:       228.4 lb Date of Birth:  1955-07-18        BSA:          2.208 m Patient Age:    73 years         BP:           148/89 mmHg Patient Gender: M                HR:           65 bpm. Exam Location:  Inpatient Procedure: 2D Echo, Cardiac Doppler, Color Doppler and Intracardiac            Opacification Agent Indications:    Chest Pain  History:        Patient has prior history of Echocardiogram examinations, most                 recent 04/20/2016. Risk Factors:Family History of Coronary                 Artery Disease and Hypertension.  Sonographer:    Helmut Muster Referring Phys: [id number] [place]  1. Left ventricular ejection fraction, by estimation, is 45%. Left ventricular ejection fraction by 2D MOD biplane is 45.2 %. The left ventricle has mildly decreased function. The left ventricle demonstrates regional wall motion abnormalities (Apical hypokinesis without LV thrombus). Left ventricular diastolic parameters are indeterminate.  2. Right ventricular systolic function is normal. The right ventricular size is normal. Tricuspid regurgitation signal is inadequate for assessing PA pressure.  3. The  mitral valve is grossly normal. Trivial mitral valve regurgitation.  4. The aortic valve is tricuspid. Aortic valve regurgitation is mild to moderate. No aortic stenosis is present.  5. Aortic dilatation noted. There is mild dilatation of the aortic root, measuring 43 mm.  6. The inferior vena cava is normal in size with greater than 50% respiratory variability, suggesting right atrial pressure of 3 mmHg. Comparison(s): A prior study was performed on 04/20/2016. Aortic root has increased in size. FINDINGS  Left Ventricle: Left ventricular ejection fraction, by estimation, is 40 to 45%. The left ventricle has mildly decreased function. The left ventricle demonstrates regional wall motion abnormalities. Definity contrast agent was given IV to delineate the left ventricular endocardial borders. The left ventricular internal cavity size was normal in size. There is no left ventricular hypertrophy. Left ventricular diastolic parameters are indeterminate.  LV Wall Scoring: The entire apex is hypokinetic. Right Ventricle: The right ventricular size is normal. No increase in right ventricular wall thickness. Right ventricular systolic function is normal. Tricuspid regurgitation signal is inadequate for assessing PA pressure. Left Atrium: Left atrial size was normal in  size. Right Atrium: Right atrial size was normal in size. Pericardium: There is no evidence of pericardial effusion. Mitral Valve: The mitral valve is grossly normal. Trivial mitral valve regurgitation. Tricuspid Valve: The tricuspid valve is normal in structure. Tricuspid valve regurgitation is trivial. No evidence of tricuspid stenosis. Aortic Valve: The aortic valve is tricuspid. Aortic valve regurgitation is mild to moderate. Aortic regurgitation PHT measures 754 msec. No aortic stenosis is present. Pulmonic Valve: The pulmonic valve was grossly normal. Pulmonic valve regurgitation is not visualized. No evidence of pulmonic stenosis. Aorta: Aortic  dilatation noted. There is mild dilatation of the aortic root, measuring 43 mm. Venous: The inferior vena cava is normal in size with greater than 50% respiratory variability, suggesting right atrial pressure of 3 mmHg. IAS/Shunts: The atrial septum is grossly normal.  LEFT VENTRICLE PLAX 2D LVIDd:         5.70 cm      Diastology LVIDs:         4.50 cm      LV e' medial:    9.11 cm/s LV PW:         1.00 cm      LV E/e' medial:  7.5 LV IVS:        0.90 cm      LV e' lateral:   9.20 cm/s LVOT diam:     2.10 cm      LV E/e' lateral: 7.5 LV SV:         53 LV SV Index:   24 LVOT Area:     3.46 cm  LV Volumes (MOD) LV vol d, MOD A2C: 165.0 ml LV vol d, MOD A4C: 150.0 ml LV vol s, MOD A2C: 92.9 ml LV vol s, MOD A4C: 81.8 ml LV SV MOD A2C:     72.1 ml LV SV MOD A4C:     150.0 ml LV SV MOD BP:      74.0 ml RIGHT VENTRICLE             IVC RV S prime:     14.90 cm/s  IVC diam: 2.10 cm TAPSE (M-mode): 2.4 cm LEFT ATRIUM           Index        RIGHT ATRIUM           Index LA diam:      3.10 cm 1.40 cm/m   RA Area:     14.10 cm LA Vol (A2C): 52.3 ml 23.68 ml/m  RA Volume:   35.00 ml  15.85 ml/m LA Vol (A4C): 49.2 ml 22.28 ml/m  AORTIC VALVE             PULMONIC VALVE LVOT Vmax:   80.00 cm/s  PR End Diast Vel: 5.02 msec LVOT Vmean:  51.400 cm/s LVOT VTI:    0.154 m AI PHT:      754 msec  AORTA Ao Root diam: 4.30 cm Ao Asc diam:  3.80 cm MITRAL VALVE MV Area (PHT): 2.39 cm    SHUNTS MV Decel Time: 317 msec    Systemic VTI:  0.15 m MV E velocity: 68.60 cm/s  Systemic Diam: 2.10 cm MV A velocity: 51.70 cm/s MV E/A ratio:  1.33 Rudean Haskell MD Electronically signed by Rudean Haskell MD Signature Date/Time: 04/13/2021/12:11:03 PM    Final      I have independently reviewed the above radiologic studies and discussed with the patient   Recent Lab Findings: Lab Results  Component Value Date  WBC 7.9 04/14/2021   HGB 10.5 (L) 04/14/2021   HCT 30.6 (L) 04/14/2021   PLT 194 04/14/2021   GLUCOSE 83 04/14/2021    CHOL 109 04/13/2021   TRIG 119 04/13/2021   HDL 32 (L) 04/13/2021   LDLCALC 53 04/13/2021   ALT 23 01/26/2021   AST 15 01/26/2021   NA 135 04/14/2021   K 3.3 (L) 04/14/2021   CL 105 04/14/2021   CREATININE 0.71 04/14/2021   BUN 7 (L) 04/14/2021   CO2 21 (L) 04/14/2021   INR 1.07 01/21/2016   HGBA1C 4.9 04/12/2021      Assessment / Plan: Severe multivessel coronary artery disease with unstable angina. Previous stenting 2017 with PCI of LAD History of adenomatous colon polyps Arthritis Hypercholesterolemia Hypertension Internal hemorrhoids Kidney stones   The surgeon will evaluate the patient and all relevant studies   I  spent 40 minutes counseling the patient face to face.  John Giovanni, PA-C  04/14/2021 9:59 AM    65yo male with LM CAD, and mild to moderate AI Currently on heparin and nitro gtt OR tomorrow for CABG 3, possible AVR  Mordche Hedglin O Carloyn Lahue

## 2021-04-14 NOTE — Progress Notes (Signed)
ANTICOAGULATION CONSULT NOTE - Follow up  Pharmacy Consult for Heparin Indication: chest pain/ACS  Allergies  Allergen Reactions   Codeine Nausea Only   Lipitor [Atorvastatin] Other (See Comments)    Myalgia    Patient Measurements: Height: 5\' 10"  (177.8 cm) Weight: 104.4 kg (230 lb 1.6 oz) (scale b) IBW/kg (Calculated) : 73 Heparin Dosing Weight: 95 kg  Vital Signs: Temp: 98.1 F (36.7 C) (11/16 0727) Temp Source: Oral (11/16 0727) BP: 104/70 (11/16 1400) Pulse Rate: 63 (11/16 0946)  Labs: Recent Labs    04/12/21 1155 04/12/21 1404 04/13/21 0342 04/14/21 0246 04/14/21 1313  HGB 13.0  --  12.0* 10.5*  --   HCT 37.4*  --  34.2* 30.6*  --   PLT 264  --  205 194  --   HEPARINUNFRC  --   --   --   --  0.26*  CREATININE 1.18  --  0.86 0.71  --   TROPONINIHS 10 9  --   --   --      Estimated Creatinine Clearance: 108.5 mL/min (by C-G formula based on SCr of 0.71 mg/dL).   Medical History: Past Medical History:  Diagnosis Date   Acute ST elevation myocardial infarction (STEMI) involving left anterior descending (LAD) coronary artery (HCC)    Adenomatous colon polyp 2000   Arthritis    Coronary artery disease involving native coronary artery of native heart without angina pectoris 03/23/2016   Diverticulosis    Erectile dysfunction    History of stroke 01/23/2017   Hypercholesteremia    Hypertension    Internal hemorrhoids    Kidney stones    Myocardial infarction [facility])    Occipital cerebral infarction ([place]) 03/23/2016   Splenomegaly     Medications:  Medications Prior to Admission  Medication Sig Dispense Refill Last Dose   AMBULATORY NON FORMULARY MEDICATION Apply 1 application topically daily as needed (pain). Medication Name: CBD Oil - daily   Past Month   aspirin EC 81 MG tablet Take by mouth.   04/12/2021   carvedilol (COREG) 25 MG tablet TAKE 1 TABLET BY MOUTH 2 (TWO) TIMES DAILY. PLEASE KEEP UPCOMING APPT IN [facility] 2022 180 tablet 3 04/12/2021 at  0545   lisinopril (ZESTRIL) 5 MG tablet TAKE 1 TABLET BY MOUTH EVERY DAY 90 tablet 3 04/12/2021   Multiple Vitamin (MULTIVITAMIN WITH MINERALS) TABS tablet Take 1 tablet by mouth daily.   Past Week   nitroGLYCERIN (NITROSTAT) 0.4 MG SL tablet Place 1 tablet (0.4 mg total) under the tongue every 5 (five) minutes as needed for chest pain. 25 tablet 3 04/12/2021 at 1030   ondansetron (ZOFRAN) 4 MG tablet Take 4 mg by mouth every 8 (eight) hours as needed for nausea/vomiting, nausea or vomiting.   unknown   oxyCODONE (OXY IR/ROXICODONE) 5 MG immediate release tablet Take 5 mg by mouth every 6 (six) hours as needed for pain or moderate pain.   04/11/2021   rosuvastatin (CRESTOR) 40 MG tablet Take 40 mg by mouth daily.   04/12/2021   terbinafine (LAMISIL) 250 MG tablet Take 1 tablet (250 mg total) by mouth daily. (Patient taking differently: Take 250 mg by mouth every evening.) 90 tablet 0 04/11/2021   tiZANidine (ZANAFLEX) 2 MG tablet Take 4 mg by mouth every 8 (eight) hours as needed for muscle spasms.   unknown   aspirin 81 MG chewable tablet Chew 1 tablet (81 mg total) by mouth daily. (Patient not taking: No sig reported)   Completed Course  Assessment:  65 y.o male presents to ER on 04/12/21 with chest pain.   Pharmacy consulted to start IV heparin for ACS/ STEMI.   Patient is not taking anticoagulation prior to admit.  SQ heparin 5k given this am @0547 >I have discontinued.  Heparin bolus and heparin drip at 1250 units/hr started this morning.   S/p cardiac cath 04/13/21 with severe multivessel CAD: 80% fistal LM disease, 30% LAD, 90% D1.  Pharmacy consulted to restart heparin infusion 8 hour post sheath removal.    Today's heparin level is 0.26 on heparin 1250 units/hr ~13 hours post restart of heparin.   No bleeding noted.  HL slightly below goal range.  Will increase heparin drip rate.   Goal of Therapy:  Heparin level 0.3-0.7 units/ml Monitor platelets by anticoagulation protocol: Yes    Plan:  Increase  Heparin drip to 1450 units/hr Check HL in 6 hr Daily HL, CBC Monitor for s/sx of bleeding    Nicole Cella, RPh Clinical Pharmacist [phone] 04/14/2021,2:51 PM Please check AMION for all [place] phone numbers After 10:00 PM, call [place]

## 2021-04-15 ENCOUNTER — Inpatient Hospital Stay (HOSPITAL_COMMUNITY): Payer: PPO

## 2021-04-15 ENCOUNTER — Inpatient Hospital Stay (HOSPITAL_COMMUNITY): Payer: PPO | Admitting: Anesthesiology

## 2021-04-15 ENCOUNTER — Other Ambulatory Visit: Payer: Self-pay

## 2021-04-15 ENCOUNTER — Encounter (HOSPITAL_COMMUNITY): Payer: Self-pay | Admitting: Cardiology

## 2021-04-15 ENCOUNTER — Inpatient Hospital Stay (HOSPITAL_COMMUNITY)
Admission: EM | Disposition: A | Discharge status: Home / Self Care | Payer: Self-pay | Source: Home / Self Care | Attending: Thoracic Surgery (Cardiothoracic Vascular Surgery)

## 2021-04-15 DIAGNOSIS — I200 Unstable angina: Secondary | ICD-10-CM | POA: Diagnosis not present

## 2021-04-15 DIAGNOSIS — Z951 Presence of aortocoronary bypass graft: Secondary | ICD-10-CM

## 2021-04-15 DIAGNOSIS — I255 Ischemic cardiomyopathy: Secondary | ICD-10-CM | POA: Diagnosis not present

## 2021-04-15 DIAGNOSIS — I25110 Atherosclerotic heart disease of native coronary artery with unstable angina pectoris: Secondary | ICD-10-CM | POA: Diagnosis not present

## 2021-04-15 DIAGNOSIS — I10 Essential (primary) hypertension: Secondary | ICD-10-CM | POA: Diagnosis not present

## 2021-04-15 DIAGNOSIS — Z953 Presence of xenogenic heart valve: Secondary | ICD-10-CM

## 2021-04-15 DIAGNOSIS — I2510 Atherosclerotic heart disease of native coronary artery without angina pectoris: Secondary | ICD-10-CM | POA: Diagnosis present

## 2021-04-15 DIAGNOSIS — I351 Nonrheumatic aortic (valve) insufficiency: Secondary | ICD-10-CM | POA: Diagnosis not present

## 2021-04-15 DIAGNOSIS — Z01810 Encounter for preprocedural cardiovascular examination: Secondary | ICD-10-CM | POA: Diagnosis not present

## 2021-04-15 HISTORY — PX: ENDOVEIN HARVEST OF GREATER SAPHENOUS VEIN: SHX5059

## 2021-04-15 HISTORY — PX: AORTIC VALVE REPLACEMENT: SHX41

## 2021-04-15 HISTORY — PX: CORONARY ARTERY BYPASS GRAFT: SHX141

## 2021-04-15 LAB — BLOOD GAS, ARTERIAL
Acid-Base Excess: 0.4 mmol/L (ref 0.0–2.0)
Bicarbonate: 24.6 mmol/L (ref 20.0–28.0)
Drawn by: 270271
FIO2: 21
O2 Saturation: 93.8 %
Patient temperature: 37
pCO2 arterial: 40.8 mmHg (ref 32.0–48.0)
pH, Arterial: 7.398 (ref 7.350–7.450)
pO2, Arterial: 68.6 mmHg — ABNORMAL LOW (ref 83.0–108.0)

## 2021-04-15 LAB — POCT I-STAT 7, (LYTES, BLD GAS, ICA,H+H)
Acid-Base Excess: 5 mmol/L — ABNORMAL HIGH (ref 0.0–2.0)
Acid-Base Excess: 2 mmol/L (ref 0.0–2.0)
Acid-Base Excess: 4 mmol/L — ABNORMAL HIGH (ref 0.0–2.0)
Acid-Base Excess: 5 mmol/L — ABNORMAL HIGH (ref 0.0–2.0)
Acid-Base Excess: 4 mmol/L — ABNORMAL HIGH (ref 0.0–2.0)
Acid-Base Excess: 1 mmol/L (ref 0.0–2.0)
Bicarbonate: 30.5 mmol/L — ABNORMAL HIGH (ref 20.0–28.0)
Bicarbonate: 27.9 mmol/L (ref 20.0–28.0)
Bicarbonate: 28.7 mmol/L — ABNORMAL HIGH (ref 20.0–28.0)
Bicarbonate: 29 mmol/L — ABNORMAL HIGH (ref 20.0–28.0)
Bicarbonate: 27.6 mmol/L (ref 20.0–28.0)
Bicarbonate: 24.4 mmol/L (ref 20.0–28.0)
Calcium, Ion: 1.17 mmol/L (ref 1.15–1.40)
Calcium, Ion: 0.94 mmol/L — ABNORMAL LOW (ref 1.15–1.40)
Calcium, Ion: 0.97 mmol/L — ABNORMAL LOW (ref 1.15–1.40)
Calcium, Ion: 0.99 mmol/L — ABNORMAL LOW (ref 1.15–1.40)
Calcium, Ion: 1.03 mmol/L — ABNORMAL LOW (ref 1.15–1.40)
Calcium, Ion: 1.29 mmol/L (ref 1.15–1.40)
HCT: 34 % — ABNORMAL LOW (ref 39.0–52.0)
HCT: 21 % — ABNORMAL LOW (ref 39.0–52.0)
HCT: 25 % — ABNORMAL LOW (ref 39.0–52.0)
HCT: 27 % — ABNORMAL LOW (ref 39.0–52.0)
HCT: 25 % — ABNORMAL LOW (ref 39.0–52.0)
HCT: 17 % — ABNORMAL LOW (ref 39.0–52.0)
Hemoglobin: 11.6 g/dL — ABNORMAL LOW (ref 13.0–17.0)
Hemoglobin: 7.1 g/dL — ABNORMAL LOW (ref 13.0–17.0)
Hemoglobin: 8.5 g/dL — ABNORMAL LOW (ref 13.0–17.0)
Hemoglobin: 9.2 g/dL — ABNORMAL LOW (ref 13.0–17.0)
Hemoglobin: 8.5 g/dL — ABNORMAL LOW (ref 13.0–17.0)
Hemoglobin: 5.8 g/dL — CL (ref 13.0–17.0)
O2 Saturation: 100 %
O2 Saturation: 100 %
O2 Saturation: 100 %
O2 Saturation: 70 %
O2 Saturation: 100 %
O2 Saturation: 100 %
Potassium: 4.7 mmol/L (ref 3.5–5.1)
Potassium: 3.5 mmol/L (ref 3.5–5.1)
Potassium: 3.9 mmol/L (ref 3.5–5.1)
Potassium: 4.3 mmol/L (ref 3.5–5.1)
Potassium: 4.2 mmol/L (ref 3.5–5.1)
Potassium: 3.8 mmol/L (ref 3.5–5.1)
Sodium: 136 mmol/L (ref 135–145)
Sodium: 134 mmol/L — ABNORMAL LOW (ref 135–145)
Sodium: 138 mmol/L (ref 135–145)
Sodium: 139 mmol/L (ref 135–145)
Sodium: 135 mmol/L (ref 135–145)
Sodium: 137 mmol/L (ref 135–145)
TCO2: 32 mmol/L (ref 22–32)
TCO2: 29 mmol/L (ref 22–32)
TCO2: 30 mmol/L (ref 22–32)
TCO2: 30 mmol/L (ref 22–32)
TCO2: 29 mmol/L (ref 22–32)
TCO2: 25 mmol/L (ref 22–32)
pCO2 arterial: 47.2 mmHg (ref 32.0–48.0)
pCO2 arterial: 38 mmHg (ref 32.0–48.0)
pCO2 arterial: 42.8 mmHg (ref 32.0–48.0)
pCO2 arterial: 48.2 mmHg — ABNORMAL HIGH (ref 32.0–48.0)
pCO2 arterial: 33.8 mmHg (ref 32.0–48.0)
pCO2 arterial: 28.8 mmHg — ABNORMAL LOW (ref 32.0–48.0)
pH, Arterial: 7.418 (ref 7.350–7.450)
pH, Arterial: 7.37 (ref 7.350–7.450)
pH, Arterial: 7.439 (ref 7.350–7.450)
pH, Arterial: 7.486 — ABNORMAL HIGH (ref 7.350–7.450)
pH, Arterial: 7.519 — ABNORMAL HIGH (ref 7.350–7.450)
pH, Arterial: 7.536 — ABNORMAL HIGH (ref 7.350–7.450)
pO2, Arterial: 319 mmHg — ABNORMAL HIGH (ref 83.0–108.0)
pO2, Arterial: 332 mmHg — ABNORMAL HIGH (ref 83.0–108.0)
pO2, Arterial: 38 mmHg — CL (ref 83.0–108.0)
pO2, Arterial: 432 mmHg — ABNORMAL HIGH (ref 83.0–108.0)
pO2, Arterial: 454 mmHg — ABNORMAL HIGH (ref 83.0–108.0)
pO2, Arterial: 175 mmHg — ABNORMAL HIGH (ref 83.0–108.0)

## 2021-04-15 LAB — BASIC METABOLIC PANEL
Anion gap: 10 (ref 5–15)
BUN: <5 mg/dL — ABNORMAL LOW (ref 8–23)
CO2: 23 mmol/L (ref 22–32)
Calcium: 8.7 mg/dL — ABNORMAL LOW (ref 8.9–10.3)
Chloride: 103 mmol/L (ref 98–111)
Creatinine, Ser: 0.74 mg/dL (ref 0.61–1.24)
GFR, Estimated: >60 mL/min (ref >60)
Glucose, Bld: 95 mg/dL (ref 70–99)
Potassium: 3.3 mmol/L — ABNORMAL LOW (ref 3.5–5.1)
Sodium: 136 mmol/L (ref 135–145)

## 2021-04-15 LAB — GLUCOSE, CAPILLARY
Glucose-Capillary: 113 mg/dL — ABNORMAL HIGH (ref 70–99)
Glucose-Capillary: 117 mg/dL — ABNORMAL HIGH (ref 70–99)
Glucose-Capillary: 123 mg/dL — ABNORMAL HIGH (ref 70–99)
Glucose-Capillary: 124 mg/dL — ABNORMAL HIGH (ref 70–99)

## 2021-04-15 LAB — POCT I-STAT, CHEM 8
BUN: 4 mg/dL — ABNORMAL LOW (ref 8–23)
BUN: <3 mg/dL — ABNORMAL LOW (ref 8–23)
BUN: <3 mg/dL — ABNORMAL LOW (ref 8–23)
BUN: 3 mg/dL — ABNORMAL LOW (ref 8–23)
BUN: <3 mg/dL — ABNORMAL LOW (ref 8–23)
BUN: 4 mg/dL — ABNORMAL LOW (ref 8–23)
Calcium, Ion: 1.14 mmol/L — ABNORMAL LOW (ref 1.15–1.40)
Calcium, Ion: 1.15 mmol/L (ref 1.15–1.40)
Calcium, Ion: 1 mmol/L — ABNORMAL LOW (ref 1.15–1.40)
Calcium, Ion: 1.03 mmol/L — ABNORMAL LOW (ref 1.15–1.40)
Calcium, Ion: 1.05 mmol/L — ABNORMAL LOW (ref 1.15–1.40)
Calcium, Ion: 1.29 mmol/L (ref 1.15–1.40)
Chloride: 101 mmol/L (ref 98–111)
Chloride: 101 mmol/L (ref 98–111)
Chloride: 100 mmol/L (ref 98–111)
Chloride: 100 mmol/L (ref 98–111)
Chloride: 101 mmol/L (ref 98–111)
Chloride: 104 mmol/L (ref 98–111)
Creatinine, Ser: 0.6 mg/dL — ABNORMAL LOW (ref 0.61–1.24)
Creatinine, Ser: 0.6 mg/dL — ABNORMAL LOW (ref 0.61–1.24)
Creatinine, Ser: 0.5 mg/dL — ABNORMAL LOW (ref 0.61–1.24)
Creatinine, Ser: 0.5 mg/dL — ABNORMAL LOW (ref 0.61–1.24)
Creatinine, Ser: 0.5 mg/dL — ABNORMAL LOW (ref 0.61–1.24)
Creatinine, Ser: 0.6 mg/dL — ABNORMAL LOW (ref 0.61–1.24)
Glucose, Bld: 115 mg/dL — ABNORMAL HIGH (ref 70–99)
Glucose, Bld: 128 mg/dL — ABNORMAL HIGH (ref 70–99)
Glucose, Bld: 114 mg/dL — ABNORMAL HIGH (ref 70–99)
Glucose, Bld: 126 mg/dL — ABNORMAL HIGH (ref 70–99)
Glucose, Bld: 128 mg/dL — ABNORMAL HIGH (ref 70–99)
Glucose, Bld: 157 mg/dL — ABNORMAL HIGH (ref 70–99)
HCT: 35 % — ABNORMAL LOW (ref 39.0–52.0)
HCT: 26 % — ABNORMAL LOW (ref 39.0–52.0)
HCT: 21 % — ABNORMAL LOW (ref 39.0–52.0)
HCT: 23 % — ABNORMAL LOW (ref 39.0–52.0)
HCT: 23 % — ABNORMAL LOW (ref 39.0–52.0)
HCT: 22 % — ABNORMAL LOW (ref 39.0–52.0)
Hemoglobin: 11.9 g/dL — ABNORMAL LOW (ref 13.0–17.0)
Hemoglobin: 8.8 g/dL — ABNORMAL LOW (ref 13.0–17.0)
Hemoglobin: 7.1 g/dL — ABNORMAL LOW (ref 13.0–17.0)
Hemoglobin: 7.8 g/dL — ABNORMAL LOW (ref 13.0–17.0)
Hemoglobin: 7.8 g/dL — ABNORMAL LOW (ref 13.0–17.0)
Hemoglobin: 7.5 g/dL — ABNORMAL LOW (ref 13.0–17.0)
Potassium: 4.9 mmol/L (ref 3.5–5.1)
Potassium: 3.6 mmol/L (ref 3.5–5.1)
Potassium: 5.5 mmol/L — ABNORMAL HIGH (ref 3.5–5.1)
Potassium: 4.3 mmol/L (ref 3.5–5.1)
Potassium: 4.4 mmol/L (ref 3.5–5.1)
Potassium: 3.7 mmol/L (ref 3.5–5.1)
Sodium: 136 mmol/L (ref 135–145)
Sodium: 137 mmol/L (ref 135–145)
Sodium: 136 mmol/L (ref 135–145)
Sodium: 134 mmol/L — ABNORMAL LOW (ref 135–145)
Sodium: 136 mmol/L (ref 135–145)
Sodium: 136 mmol/L (ref 135–145)
TCO2: 29 mmol/L (ref 22–32)
TCO2: 25 mmol/L (ref 22–32)
TCO2: 28 mmol/L (ref 22–32)
TCO2: 27 mmol/L (ref 22–32)
TCO2: 28 mmol/L (ref 22–32)
TCO2: 27 mmol/L (ref 22–32)

## 2021-04-15 LAB — PROTIME-INR
INR: 1.4 — ABNORMAL HIGH (ref 0.8–1.2)
Prothrombin Time: 17.6 seconds — ABNORMAL HIGH (ref 11.4–15.2)

## 2021-04-15 LAB — CBC
HCT: 32 % — ABNORMAL LOW (ref 39.0–52.0)
HCT: 31.1 % — ABNORMAL LOW (ref 39.0–52.0)
Hemoglobin: 11.2 g/dL — ABNORMAL LOW (ref 13.0–17.0)
Hemoglobin: 11.2 g/dL — ABNORMAL LOW (ref 13.0–17.0)
MCH: 33.4 pg (ref 26.0–34.0)
MCH: 34 pg (ref 26.0–34.0)
MCHC: 35 g/dL (ref 30.0–36.0)
MCHC: 36 g/dL (ref 30.0–36.0)
MCV: 95.5 fL (ref 80.0–100.0)
MCV: 94.5 fL (ref 80.0–100.0)
Platelets: 177 10*3/uL (ref 150–400)
Platelets: 188 10*3/uL (ref 150–400)
RBC: 3.35 MIL/uL — ABNORMAL LOW (ref 4.22–5.81)
RBC: 3.29 MIL/uL — ABNORMAL LOW (ref 4.22–5.81)
RDW: 12.2 % (ref 11.5–15.5)
RDW: 12.3 % (ref 11.5–15.5)
WBC: 5.9 10*3/uL (ref 4.0–10.5)
WBC: 14.3 10*3/uL — ABNORMAL HIGH (ref 4.0–10.5)
nRBC: 0 % (ref 0.0–0.2)
nRBC: 0 % (ref 0.0–0.2)

## 2021-04-15 LAB — URINALYSIS, ROUTINE W REFLEX MICROSCOPIC
Bilirubin Urine: NEGATIVE
Glucose, UA: NEGATIVE mg/dL
Hgb urine dipstick: NEGATIVE
Ketones, ur: 5 mg/dL — AB
Leukocytes,Ua: NEGATIVE
Nitrite: NEGATIVE
Protein, ur: NEGATIVE mg/dL
Specific Gravity, Urine: 1.011 (ref 1.005–1.030)
pH: 6 (ref 5.0–8.0)

## 2021-04-15 LAB — HEMOGLOBIN AND HEMATOCRIT, BLOOD
HCT: 24.5 % — ABNORMAL LOW (ref 39.0–52.0)
Hemoglobin: 8.5 g/dL — ABNORMAL LOW (ref 13.0–17.0)

## 2021-04-15 LAB — APTT: aPTT: 32 seconds (ref 24–36)

## 2021-04-15 LAB — PLATELET COUNT: Platelets: 160 10*3/uL (ref 150–400)

## 2021-04-15 LAB — HEPARIN LEVEL (UNFRACTIONATED): Heparin Unfractionated: 0.45 IU/mL (ref 0.30–0.70)

## 2021-04-15 LAB — ABO/RH: ABO/RH(D): O POS

## 2021-04-15 LAB — PREPARE RBC (CROSSMATCH)

## 2021-04-15 SURGERY — CORONARY ARTERY BYPASS GRAFTING (CABG)
Anesthesia: General | Site: Leg Upper | Laterality: Right

## 2021-04-15 MED ORDER — POTASSIUM CHLORIDE 2 MEQ/ML IV SOLN
80.0000 meq | INTRAVENOUS | Status: DC
  Filled 2021-04-15: qty 40

## 2021-04-15 MED ORDER — FAMOTIDINE IN NACL 20-0.9 MG/50ML-% IV SOLN
20.0000 mg | Freq: Two times a day (BID) | INTRAVENOUS | Status: DC
  Administered 2021-04-15: 23:00:00 20 mg via INTRAVENOUS
  Filled 2021-04-15: qty 50

## 2021-04-15 MED ORDER — CHLORHEXIDINE GLUCONATE CLOTH 2 % EX PADS
6.0000 | MEDICATED_PAD | Freq: Once | CUTANEOUS | Status: AC
  Administered 2021-04-15: 10:00:00 6 via TOPICAL

## 2021-04-15 MED ORDER — MIDAZOLAM HCL (PF) 10 MG/2ML IJ SOLN
INTRAMUSCULAR | Status: AC
  Filled 2021-04-15: qty 2

## 2021-04-15 MED ORDER — CHLORHEXIDINE GLUCONATE CLOTH 2 % EX PADS
6.0000 | MEDICATED_PAD | Freq: Once | CUTANEOUS | Status: AC
  Administered 2021-04-15: 6 via TOPICAL

## 2021-04-15 MED ORDER — NICARDIPINE HCL IN NACL 20-0.86 MG/200ML-% IV SOLN: 0.0000 mg/h | INTRAVENOUS | Status: DC

## 2021-04-15 MED ORDER — TRANEXAMIC ACID (OHS) PUMP PRIME SOLUTION
2.0000 mg/kg | INTRAVENOUS | Status: DC
  Filled 2021-04-15: qty 2.03

## 2021-04-15 MED ORDER — PROTAMINE SULFATE 10 MG/ML IV SOLN
INTRAVENOUS | Status: DC | PRN
  Administered 2021-04-15: 360 mg via INTRAVENOUS

## 2021-04-15 MED ORDER — METOPROLOL TARTRATE 5 MG/5ML IV SOLN: 2.5000 mg | INTRAVENOUS | Status: DC | PRN

## 2021-04-15 MED ORDER — SODIUM CHLORIDE 0.9% FLUSH
3.0000 mL | Freq: Two times a day (BID) | INTRAVENOUS | Status: DC
  Administered 2021-04-16 – 2021-04-17 (×3): 3 mL via INTRAVENOUS
  Administered 2021-04-17: 10 mL via INTRAVENOUS
  Administered 2021-04-18: 3 mL via INTRAVENOUS

## 2021-04-15 MED ORDER — MORPHINE SULFATE (PF) 2 MG/ML IV SOLN
1.0000 mg | INTRAVENOUS | Status: DC | PRN
  Administered 2021-04-16 (×4): 4 mg via INTRAVENOUS
  Administered 2021-04-16: 2 mg via INTRAVENOUS
  Administered 2021-04-16 – 2021-04-17 (×4): 4 mg via INTRAVENOUS
  Administered 2021-04-17: 2 mg via INTRAVENOUS
  Administered 2021-04-17: 4 mg via INTRAVENOUS
  Filled 2021-04-15: qty 1
  Filled 2021-04-15 (×5): qty 2
  Filled 2021-04-15: qty 1
  Filled 2021-04-15 (×4): qty 2

## 2021-04-15 MED ORDER — INSULIN REGULAR(HUMAN) IN NACL 100-0.9 UT/100ML-% IV SOLN
INTRAVENOUS | Status: AC
  Administered 2021-04-15: 15:00:00 1 [IU]/h via INTRAVENOUS
  Filled 2021-04-15: qty 100

## 2021-04-15 MED ORDER — FENTANYL CITRATE (PF) 250 MCG/5ML IJ SOLN
INTRAMUSCULAR | Status: AC
  Filled 2021-04-15: qty 5

## 2021-04-15 MED ORDER — POTASSIUM CHLORIDE 10 MEQ/50ML IV SOLN
10.0000 meq | INTRAVENOUS | Status: AC
  Administered 2021-04-15 – 2021-04-16 (×3): 10 meq via INTRAVENOUS

## 2021-04-15 MED ORDER — TRANEXAMIC ACID 1000 MG/10ML IV SOLN
1.5000 mg/kg/h | INTRAVENOUS | Status: AC
  Administered 2021-04-15: 16:00:00 1.5 mg/kg/h via INTRAVENOUS
  Filled 2021-04-15: qty 25

## 2021-04-15 MED ORDER — ALBUMIN HUMAN 5 % IV SOLN
250.0000 mL | INTRAVENOUS | Status: AC | PRN
  Administered 2021-04-15 – 2021-04-16 (×2): 12.5 g via INTRAVENOUS

## 2021-04-15 MED ORDER — ORAL CARE MOUTH RINSE: 15.0000 mL | Freq: Once | OROMUCOSAL | Status: AC

## 2021-04-15 MED ORDER — LACTATED RINGERS IV SOLN
500.0000 mL | Freq: Once | INTRAVENOUS | Status: AC | PRN
  Administered 2021-04-16: 500 mL via INTRAVENOUS

## 2021-04-15 MED ORDER — NITROGLYCERIN IN D5W 200-5 MCG/ML-% IV SOLN
2.0000 ug/min | INTRAVENOUS | Status: DC
  Filled 2021-04-15: qty 250

## 2021-04-15 MED ORDER — FENTANYL CITRATE (PF) 250 MCG/5ML IJ SOLN
INTRAMUSCULAR | Status: DC | PRN
  Administered 2021-04-15 (×3): 100 ug via INTRAVENOUS
  Administered 2021-04-15 (×2): 50 ug via INTRAVENOUS
  Administered 2021-04-15 (×2): 100 ug via INTRAVENOUS
  Administered 2021-04-15: 150 ug via INTRAVENOUS
  Administered 2021-04-15: 100 ug via INTRAVENOUS
  Administered 2021-04-15: 150 ug via INTRAVENOUS
  Administered 2021-04-15: 100 ug via INTRAVENOUS
  Administered 2021-04-15: 150 ug via INTRAVENOUS

## 2021-04-15 MED ORDER — LACTATED RINGERS IV SOLN: INTRAVENOUS | Status: DC | PRN

## 2021-04-15 MED ORDER — CEFAZOLIN SODIUM-DEXTROSE 2-4 GM/100ML-% IV SOLN
2.0000 g | INTRAVENOUS | Status: AC
  Administered 2021-04-15: 15:00:00 2 g via INTRAVENOUS
  Filled 2021-04-15: qty 100

## 2021-04-15 MED ORDER — PROPOFOL 10 MG/ML IV BOLUS
INTRAVENOUS | Status: DC | PRN
  Administered 2021-04-15: 120 mg via INTRAVENOUS
  Administered 2021-04-15: 30 mg via INTRAVENOUS

## 2021-04-15 MED ORDER — PANTOPRAZOLE SODIUM 40 MG PO TBEC
40.0000 mg | DELAYED_RELEASE_TABLET | Freq: Every day | ORAL | Status: DC
  Administered 2021-04-17 – 2021-04-20 (×4): 40 mg via ORAL
  Filled 2021-04-15 (×4): qty 1

## 2021-04-15 MED ORDER — PROPOFOL 10 MG/ML IV BOLUS
INTRAVENOUS | Status: AC
  Filled 2021-04-15: qty 20

## 2021-04-15 MED ORDER — MAGNESIUM SULFATE 4 GM/100ML IV SOLN
4.0000 g | Freq: Once | INTRAVENOUS | Status: AC
  Administered 2021-04-15: 21:00:00 4 g via INTRAVENOUS
  Filled 2021-04-15: qty 100

## 2021-04-15 MED ORDER — SODIUM CHLORIDE 0.9 % IV SOLN: INTRAVENOUS | Status: DC

## 2021-04-15 MED ORDER — CEFAZOLIN SODIUM-DEXTROSE 2-4 GM/100ML-% IV SOLN
2.0000 g | Freq: Three times a day (TID) | INTRAVENOUS | Status: DC
  Administered 2021-04-16: 2 g via INTRAVENOUS
  Filled 2021-04-15: qty 100

## 2021-04-15 MED ORDER — PHENYLEPHRINE HCL-NACL 20-0.9 MG/250ML-% IV SOLN
0.0000 ug/min | INTRAVENOUS | Status: DC
  Administered 2021-04-16: 45 ug/min via INTRAVENOUS
  Filled 2021-04-15: qty 250

## 2021-04-15 MED ORDER — LACTATED RINGERS IV SOLN: INTRAVENOUS | Status: DC

## 2021-04-15 MED ORDER — ACETAMINOPHEN 650 MG RE SUPP
650.0000 mg | Freq: Once | RECTAL | Status: AC
  Administered 2021-04-15: 22:00:00 650 mg via RECTAL

## 2021-04-15 MED ORDER — CHLORHEXIDINE GLUCONATE 0.12 % MT SOLN
OROMUCOSAL | Status: AC
  Administered 2021-04-15: 14:00:00 15 mL via OROMUCOSAL
  Filled 2021-04-15: qty 15

## 2021-04-15 MED ORDER — HEPARIN SODIUM (PORCINE) 1000 UNIT/ML IJ SOLN
INTRAMUSCULAR | Status: DC | PRN
  Administered 2021-04-15: 36000 [IU] via INTRAVENOUS

## 2021-04-15 MED ORDER — ONDANSETRON HCL 4 MG/2ML IJ SOLN
INTRAMUSCULAR | Status: DC | PRN
  Administered 2021-04-15: 4 mg via INTRAVENOUS

## 2021-04-15 MED ORDER — ONDANSETRON HCL 4 MG/2ML IJ SOLN
INTRAMUSCULAR | Status: AC
  Filled 2021-04-15: qty 2

## 2021-04-15 MED ORDER — ACETAMINOPHEN 160 MG/5ML PO SOLN: 650.0000 mg | Freq: Once | ORAL | Status: AC

## 2021-04-15 MED ORDER — ROCURONIUM BROMIDE 10 MG/ML (PF) SYRINGE
PREFILLED_SYRINGE | INTRAVENOUS | Status: DC | PRN
  Administered 2021-04-15 (×2): 50 mg via INTRAVENOUS
  Administered 2021-04-15: 60 mg via INTRAVENOUS
  Administered 2021-04-15: 40 mg via INTRAVENOUS

## 2021-04-15 MED ORDER — EPINEPHRINE HCL 5 MG/250ML IV SOLN IN NS
0.0000 ug/min | INTRAVENOUS | Status: DC
  Filled 2021-04-15: qty 250

## 2021-04-15 MED ORDER — SODIUM CHLORIDE 0.9 % IV SOLN: 250.0000 mL | INTRAVENOUS | Status: DC

## 2021-04-15 MED ORDER — PHENYLEPHRINE 40 MCG/ML (10ML) SYRINGE FOR IV PUSH (FOR BLOOD PRESSURE SUPPORT)
PREFILLED_SYRINGE | INTRAVENOUS | Status: AC
  Filled 2021-04-15: qty 10

## 2021-04-15 MED ORDER — TEMAZEPAM 15 MG PO CAPS
15.0000 mg | ORAL_CAPSULE | Freq: Once | ORAL | Status: AC | PRN
  Administered 2021-04-15: 15 mg via ORAL
  Filled 2021-04-15: qty 1

## 2021-04-15 MED ORDER — HEPARIN 30,000 UNITS/1000 ML (OHS) CELLSAVER SOLUTION
Status: DC
  Filled 2021-04-15: qty 1000

## 2021-04-15 MED ORDER — METOPROLOL TARTRATE 25 MG/10 ML ORAL SUSPENSION: 12.5000 mg | Freq: Two times a day (BID) | ORAL | Status: DC

## 2021-04-15 MED ORDER — BISACODYL 10 MG RE SUPP: 10.0000 mg | Freq: Every day | RECTAL | Status: DC

## 2021-04-15 MED ORDER — NOREPINEPHRINE 4 MG/250ML-% IV SOLN
0.0000 ug/min | INTRAVENOUS | Status: DC
  Filled 2021-04-15: qty 250

## 2021-04-15 MED ORDER — PLASMA-LYTE A IV SOLN
INTRAVENOUS | Status: DC | PRN
  Administered 2021-04-15: 16:00:00 1000 mL via INTRAVASCULAR

## 2021-04-15 MED ORDER — ONDANSETRON HCL 4 MG/2ML IJ SOLN
4.0000 mg | Freq: Four times a day (QID) | INTRAMUSCULAR | Status: DC | PRN
  Administered 2021-04-16 – 2021-04-17 (×2): 4 mg via INTRAVENOUS
  Filled 2021-04-15 (×2): qty 2

## 2021-04-15 MED ORDER — METOPROLOL TARTRATE 12.5 MG HALF TABLET
12.5000 mg | ORAL_TABLET | Freq: Two times a day (BID) | ORAL | Status: DC
  Administered 2021-04-16 – 2021-04-18 (×5): 12.5 mg via ORAL
  Filled 2021-04-15 (×5): qty 1

## 2021-04-15 MED ORDER — TERBINAFINE HCL 250 MG PO TABS
250.0000 mg | ORAL_TABLET | Freq: Every evening | ORAL | Status: DC
  Administered 2021-04-17 – 2021-04-19 (×3): 250 mg via ORAL
  Filled 2021-04-15 (×4): qty 1

## 2021-04-15 MED ORDER — TRAMADOL HCL 50 MG PO TABS
50.0000 mg | ORAL_TABLET | ORAL | Status: DC | PRN
  Administered 2021-04-16 – 2021-04-17 (×4): 100 mg via ORAL
  Filled 2021-04-15 (×5): qty 2

## 2021-04-15 MED ORDER — ASPIRIN EC 325 MG PO TBEC
325.0000 mg | DELAYED_RELEASE_TABLET | Freq: Every day | ORAL | Status: DC
  Administered 2021-04-16 – 2021-04-20 (×5): 325 mg via ORAL
  Filled 2021-04-15 (×5): qty 1

## 2021-04-15 MED ORDER — ASPIRIN 81 MG PO CHEW: 324.0000 mg | CHEWABLE_TABLET | Freq: Every day | ORAL | Status: DC

## 2021-04-15 MED ORDER — ACETAMINOPHEN 500 MG PO TABS
1000.0000 mg | ORAL_TABLET | Freq: Four times a day (QID) | ORAL | Status: DC
  Administered 2021-04-16 – 2021-04-20 (×16): 1000 mg via ORAL
  Filled 2021-04-15 (×16): qty 2

## 2021-04-15 MED ORDER — PHENYLEPHRINE HCL-NACL 20-0.9 MG/250ML-% IV SOLN
30.0000 ug/min | INTRAVENOUS | Status: AC
  Administered 2021-04-15: 20:00:00 15 ug/min via INTRAVENOUS
  Filled 2021-04-15: qty 250

## 2021-04-15 MED ORDER — CHLORHEXIDINE GLUCONATE CLOTH 2 % EX PADS
6.0000 | MEDICATED_PAD | Freq: Every day | CUTANEOUS | Status: DC
  Administered 2021-04-16 – 2021-04-18 (×3): 6 via TOPICAL

## 2021-04-15 MED ORDER — PLASMA-LYTE A IV SOLN
INTRAVENOUS | Status: DC
  Filled 2021-04-15: qty 5

## 2021-04-15 MED ORDER — MILRINONE LACTATE IN DEXTROSE 20-5 MG/100ML-% IV SOLN
0.3000 ug/kg/min | INTRAVENOUS | Status: DC
  Filled 2021-04-15: qty 100

## 2021-04-15 MED ORDER — CHLORHEXIDINE GLUCONATE 0.12 % MT SOLN
15.0000 mL | OROMUCOSAL | Status: AC
  Administered 2021-04-15: 22:00:00 15 mL via OROMUCOSAL

## 2021-04-15 MED ORDER — MIDAZOLAM HCL 2 MG/2ML IJ SOLN
2.0000 mg | INTRAMUSCULAR | Status: DC | PRN
  Administered 2021-04-15 – 2021-04-16 (×3): 2 mg via INTRAVENOUS
  Filled 2021-04-15 (×3): qty 2

## 2021-04-15 MED ORDER — TRANEXAMIC ACID (OHS) BOLUS VIA INFUSION
15.0000 mg/kg | INTRAVENOUS | Status: AC
  Administered 2021-04-15: 15:00:00 1524 mg via INTRAVENOUS
  Filled 2021-04-15: qty 1524

## 2021-04-15 MED ORDER — MANNITOL 20 % IV SOLN
INTRAVENOUS | Status: DC
  Filled 2021-04-15: qty 13

## 2021-04-15 MED ORDER — METOPROLOL TARTRATE 12.5 MG HALF TABLET: 12.5000 mg | ORAL_TABLET | Freq: Once | ORAL | Status: DC

## 2021-04-15 MED ORDER — VANCOMYCIN HCL IN DEXTROSE 1-5 GM/200ML-% IV SOLN
1000.0000 mg | Freq: Once | INTRAVENOUS | Status: AC
  Administered 2021-04-16: 1000 mg via INTRAVENOUS
  Filled 2021-04-15: qty 200

## 2021-04-15 MED ORDER — FENTANYL CITRATE (PF) 250 MCG/5ML IJ SOLN
INTRAMUSCULAR | Status: AC
  Filled 2021-04-15: qty 10

## 2021-04-15 MED ORDER — CHLORHEXIDINE GLUCONATE 0.12 % MT SOLN: 15.0000 mL | Freq: Once | OROMUCOSAL | Status: AC

## 2021-04-15 MED ORDER — DEXTROSE 50 % IV SOLN: 0.0000 mL | INTRAVENOUS | Status: DC | PRN

## 2021-04-15 MED ORDER — BISACODYL 5 MG PO TBEC: 5.0000 mg | DELAYED_RELEASE_TABLET | Freq: Once | ORAL | Status: DC

## 2021-04-15 MED ORDER — SODIUM CHLORIDE (PF) 0.9 % IJ SOLN
OROMUCOSAL | Status: DC | PRN
  Administered 2021-04-15 (×2): 4 mL via TOPICAL

## 2021-04-15 MED ORDER — OXYCODONE HCL 5 MG PO TABS
5.0000 mg | ORAL_TABLET | ORAL | Status: DC | PRN
  Administered 2021-04-16 – 2021-04-18 (×5): 10 mg via ORAL
  Filled 2021-04-15 (×6): qty 2

## 2021-04-15 MED ORDER — MIDAZOLAM HCL 5 MG/5ML IJ SOLN
INTRAMUSCULAR | Status: DC | PRN
  Administered 2021-04-15 (×5): 2 mg via INTRAVENOUS

## 2021-04-15 MED ORDER — SUCCINYLCHOLINE CHLORIDE 200 MG/10ML IV SOSY
PREFILLED_SYRINGE | INTRAVENOUS | Status: DC | PRN
  Administered 2021-04-15: 140 mg via INTRAVENOUS

## 2021-04-15 MED ORDER — NITROGLYCERIN IN D5W 200-5 MCG/ML-% IV SOLN: 0.0000 ug/min | INTRAVENOUS | Status: DC

## 2021-04-15 MED ORDER — VANCOMYCIN HCL 1500 MG/300ML IV SOLN
1500.0000 mg | INTRAVENOUS | Status: AC
  Administered 2021-04-15: 15:00:00 1500 mg via INTRAVENOUS
  Filled 2021-04-15: qty 300

## 2021-04-15 MED ORDER — BISACODYL 5 MG PO TBEC
10.0000 mg | DELAYED_RELEASE_TABLET | Freq: Every day | ORAL | Status: DC
  Administered 2021-04-16 – 2021-04-17 (×2): 10 mg via ORAL
  Filled 2021-04-15 (×3): qty 2

## 2021-04-15 MED ORDER — ACETAMINOPHEN 160 MG/5ML PO SOLN
1000.0000 mg | Freq: Four times a day (QID) | ORAL | Status: DC
  Administered 2021-04-16: 05:00:00 1000 mg
  Filled 2021-04-15: qty 40.6

## 2021-04-15 MED ORDER — DEXMEDETOMIDINE HCL IN NACL 400 MCG/100ML IV SOLN
0.0000 ug/kg/h | INTRAVENOUS | Status: DC
  Administered 2021-04-15 – 2021-04-16 (×2): 0.7 ug/kg/h via INTRAVENOUS
  Filled 2021-04-15 (×2): qty 100

## 2021-04-15 MED ORDER — CEFAZOLIN SODIUM-DEXTROSE 2-4 GM/100ML-% IV SOLN
2.0000 g | INTRAVENOUS | Status: AC
  Administered 2021-04-15: 19:00:00 2 g via INTRAVENOUS
  Filled 2021-04-15: qty 100

## 2021-04-15 MED ORDER — SODIUM CHLORIDE 0.45 % IV SOLN: INTRAVENOUS | Status: DC | PRN

## 2021-04-15 MED ORDER — CHLORHEXIDINE GLUCONATE 0.12 % MT SOLN
15.0000 mL | Freq: Once | OROMUCOSAL | Status: DC
  Filled 2021-04-15: qty 15

## 2021-04-15 MED ORDER — INSULIN REGULAR(HUMAN) IN NACL 100-0.9 UT/100ML-% IV SOLN: INTRAVENOUS | Status: DC

## 2021-04-15 MED ORDER — ARTIFICIAL TEARS OPHTHALMIC OINT
TOPICAL_OINTMENT | OPHTHALMIC | Status: DC | PRN
  Administered 2021-04-15: 1 via OPHTHALMIC

## 2021-04-15 MED ORDER — SODIUM CHLORIDE 0.9% FLUSH: 3.0000 mL | INTRAVENOUS | Status: DC | PRN

## 2021-04-15 MED ORDER — 0.9 % SODIUM CHLORIDE (POUR BTL) OPTIME
TOPICAL | Status: DC | PRN
  Administered 2021-04-15: 16:00:00 5000 mL

## 2021-04-15 MED ORDER — DEXMEDETOMIDINE HCL IN NACL 400 MCG/100ML IV SOLN
0.1000 ug/kg/h | INTRAVENOUS | Status: AC
  Administered 2021-04-15: 15:00:00 .3 ug/kg/h via INTRAVENOUS
  Filled 2021-04-15: qty 100

## 2021-04-15 MED ORDER — DOCUSATE SODIUM 100 MG PO CAPS
200.0000 mg | ORAL_CAPSULE | Freq: Every day | ORAL | Status: DC
  Administered 2021-04-16 – 2021-04-18 (×3): 200 mg via ORAL
  Filled 2021-04-15 (×4): qty 2

## 2021-04-15 SURGICAL SUPPLY — 106 items
ADH SKN CLS APL DERMABOND .7 (GAUZE/BANDAGES/DRESSINGS) ×2
BAG COUNTER SPONGE SURGICOUNT (BAG) ×1 IMPLANT
BAG DECANTER FOR FLEXI CONT (MISCELLANEOUS) ×3 IMPLANT
BAG SPNG CNTER NS LX DISP (BAG) ×2
BLADE CLIPPER SURG (BLADE) ×3 IMPLANT
BLADE STERNUM SYSTEM 6 (BLADE) ×3 IMPLANT
BLADE SURG 11 STRL SS (BLADE) ×1 IMPLANT
BNDG ELASTIC 4X5.8 VLCR STR LF (GAUZE/BANDAGES/DRESSINGS) ×3 IMPLANT
BNDG ELASTIC 6X5.8 VLCR STR LF (GAUZE/BANDAGES/DRESSINGS) ×3 IMPLANT
BNDG GAUZE ELAST 4 BULKY (GAUZE/BANDAGES/DRESSINGS) ×3 IMPLANT
CABLE SURGICAL S-101-97-12 (CABLE) ×1 IMPLANT
CANISTER SUCT 3000ML PPV (MISCELLANEOUS) ×3 IMPLANT
CANNULA MC2 2 STG 29/37 NON-V (CANNULA) ×2 IMPLANT
CANNULA MC2 TWO STAGE (CANNULA) ×3
CANNULA NON VENT 20FR 12 (CANNULA) ×1 IMPLANT
CANNULA NON VENT 22FR 12 (CANNULA) ×2 IMPLANT
CANNULA SUMP PERICARDIAL (CANNULA) ×2 IMPLANT
CATH ROBINSON RED A/P 18FR (CATHETERS) ×7 IMPLANT
CLIP RETRACTION 3.0MM CORONARY (MISCELLANEOUS) ×1 IMPLANT
CLIP VESOCCLUDE MED 24/CT (CLIP) IMPLANT
CLIP VESOCCLUDE SM WIDE 24/CT (CLIP) IMPLANT
CNTNR URN SCR LID CUP LEK RST (MISCELLANEOUS) ×1 IMPLANT
CONN ST 1/2X1/2  BEN (MISCELLANEOUS) ×3
CONN ST 1/2X1/2 BEN (MISCELLANEOUS) ×2 IMPLANT
CONNECTOR BLAKE 2:1 CARIO BLK (MISCELLANEOUS) ×3 IMPLANT
CONT SPEC 4OZ STRL OR WHT (MISCELLANEOUS) ×3
CONTAINER PROTECT SURGISLUSH (MISCELLANEOUS) ×6 IMPLANT
DERMABOND ADVANCED (GAUZE/BANDAGES/DRESSINGS) ×1
DERMABOND ADVANCED .7 DNX12 (GAUZE/BANDAGES/DRESSINGS) ×1 IMPLANT
DEVICE SUT CK QUICK LOAD MINI (Prosthesis & Implant Heart) ×3 IMPLANT
DRAIN CHANNEL 19F RND (DRAIN) ×7 IMPLANT
DRAIN CONNECTOR BLAKE 1:1 (MISCELLANEOUS) ×3 IMPLANT
DRAPE CARDIOVASCULAR INCISE (DRAPES) ×3
DRAPE INCISE IOBAN 66X45 STRL (DRAPES) IMPLANT
DRAPE SRG 135X102X78XABS (DRAPES) ×2 IMPLANT
DRAPE WARM FLUID 44X44 (DRAPES) ×3 IMPLANT
DRSG AQUACEL AG ADV 3.5X10 (GAUZE/BANDAGES/DRESSINGS) ×3 IMPLANT
DRSG COVADERM 4X14 (GAUZE/BANDAGES/DRESSINGS) ×2 IMPLANT
ELECT BLADE 4.0 EZ CLEAN MEGAD (MISCELLANEOUS) ×3
ELECT REM PT RETURN 9FT ADLT (ELECTROSURGICAL) ×6
ELECTRODE BLDE 4.0 EZ CLN MEGD (MISCELLANEOUS) ×2 IMPLANT
ELECTRODE REM PT RTRN 9FT ADLT (ELECTROSURGICAL) ×4 IMPLANT
FELT TEFLON 1X6 (MISCELLANEOUS) ×3 IMPLANT
GAUZE 4X4 16PLY ~~LOC~~+RFID DBL (SPONGE) ×2 IMPLANT
GAUZE SPONGE 4X4 12PLY STRL (GAUZE/BANDAGES/DRESSINGS) ×6 IMPLANT
GAUZE SPONGE 4X4 12PLY STRL LF (GAUZE/BANDAGES/DRESSINGS) ×4 IMPLANT
GLOVE SURG ENC MOIS LTX SZ7 (GLOVE) ×6 IMPLANT
GLOVE SURG ENC TEXT LTX SZ7.5 (GLOVE) ×6 IMPLANT
GLOVE SURG MICRO LTX SZ6 (GLOVE) ×1 IMPLANT
GLOVE SURG MICRO LTX SZ6.5 (GLOVE) ×1 IMPLANT
GLOVE SURG PR MICRO ENCORE 7.5 (GLOVE) ×4 IMPLANT
GLOVE SURG UNDER POLY LF SZ6.5 (GLOVE) ×4 IMPLANT
GOWN STRL REUS W/ TWL LRG LVL3 (GOWN DISPOSABLE) ×10 IMPLANT
GOWN STRL REUS W/ TWL XL LVL3 (GOWN DISPOSABLE) ×4 IMPLANT
GOWN STRL REUS W/TWL LRG LVL3 (GOWN DISPOSABLE) ×15
GOWN STRL REUS W/TWL XL LVL3 (GOWN DISPOSABLE) ×12
HEMOSTAT POWDER SURGIFOAM 1G (HEMOSTASIS) ×7 IMPLANT
INSERT FOGARTY XLG (MISCELLANEOUS) ×2 IMPLANT
INSERT SUTURE HOLDER (MISCELLANEOUS) ×3 IMPLANT
KIT BASIN OR (CUSTOM PROCEDURE TRAY) ×3 IMPLANT
KIT SUCTION CATH 14FR (SUCTIONS) ×3 IMPLANT
KIT SUT CK MINI COMBO 4X17 (Prosthesis & Implant Heart) ×2 IMPLANT
KIT TURNOVER KIT B (KITS) ×3 IMPLANT
KIT VASOVIEW HEMOPRO 2 VH 4000 (KITS) ×3 IMPLANT
LEAD PACING MYOCARDI (MISCELLANEOUS) ×7 IMPLANT
LINE VENT (MISCELLANEOUS) ×2 IMPLANT
MARKER GRAFT CORONARY BYPASS (MISCELLANEOUS) ×9 IMPLANT
NS IRRIG 1000ML POUR BTL (IV SOLUTION) ×15 IMPLANT
PACK ACCESSORY CANNULA KIT (KITS) ×3 IMPLANT
PACK E OPEN HEART (SUTURE) ×3 IMPLANT
PACK OPEN HEART (CUSTOM PROCEDURE TRAY) ×3 IMPLANT
PAD ARMBOARD 7.5X6 YLW CONV (MISCELLANEOUS) ×6 IMPLANT
PAD ELECT DEFIB RADIOL ZOLL (MISCELLANEOUS) ×3 IMPLANT
PENCIL BUTTON HOLSTER BLD 10FT (ELECTRODE) ×3 IMPLANT
POSITIONER HEAD DONUT 9IN (MISCELLANEOUS) ×3 IMPLANT
PUNCH AORTIC ROTATE 4.0MM (MISCELLANEOUS) ×3 IMPLANT
SET MPS 3-ND DEL (MISCELLANEOUS) ×2 IMPLANT
SOL PREP POV-IOD 4OZ 10% (MISCELLANEOUS) ×3 IMPLANT
SOL PREP PROV IODINE SCRUB 4OZ (MISCELLANEOUS) ×2 IMPLANT
SPONGE T-LAP 18X18 ~~LOC~~+RFID (SPONGE) ×12 IMPLANT
SUPPORT HEART JANKE-BARRON (MISCELLANEOUS) ×3 IMPLANT
SUT BONE WAX W31G (SUTURE) ×3 IMPLANT
SUT EB EXC GRN/WHT 2-0 V-5 (SUTURE) ×6 IMPLANT
SUT ETHIBOND X763 2 0 SH 1 (SUTURE) ×6 IMPLANT
SUT MNCRL AB 3-0 PS2 18 (SUTURE) ×6 IMPLANT
SUT MNCRL AB 4-0 PS2 18 (SUTURE) ×2 IMPLANT
SUT PDS AB 1 CTX 36 (SUTURE) ×6 IMPLANT
SUT PROLENE 4 0 RB 1 (SUTURE) ×12
SUT PROLENE 4 0 SH DA (SUTURE) ×3 IMPLANT
SUT PROLENE 4-0 RB1 .5 CRCL 36 (SUTURE) ×8 IMPLANT
SUT PROLENE 5 0 C 1 36 (SUTURE) ×15 IMPLANT
SUT PROLENE 7 0 BV1 MDA (SUTURE) ×4 IMPLANT
SUT STEEL 6MS V (SUTURE) ×4 IMPLANT
SUT STEEL STERNAL CCS#1 18IN (SUTURE) ×3 IMPLANT
SUT VIC AB 2-0 CT1 27 (SUTURE) ×3
SUT VIC AB 2-0 CT1 TAPERPNT 27 (SUTURE) ×2 IMPLANT
SYSTEM SAHARA CHEST DRAIN ATS (WOUND CARE) ×3 IMPLANT
TAPE CLOTH SURG 4X10 WHT LF (GAUZE/BANDAGES/DRESSINGS) ×1 IMPLANT
TAPE PAPER 2X10 WHT MICROPORE (GAUZE/BANDAGES/DRESSINGS) ×2 IMPLANT
TOWEL GREEN STERILE (TOWEL DISPOSABLE) ×3 IMPLANT
TOWEL GREEN STERILE FF (TOWEL DISPOSABLE) ×3 IMPLANT
TRAY FOLEY SLVR 16FR TEMP STAT (SET/KITS/TRAYS/PACK) ×3 IMPLANT
TUBING LAP HI FLOW INSUFFLATIO (TUBING) ×3 IMPLANT
UNDERPAD 30X36 HEAVY ABSORB (UNDERPADS AND DIAPERS) ×3 IMPLANT
VALVE AORTIC SZ27 INSP/RESIL (Valve) ×2 IMPLANT
WATER STERILE IRR 1000ML POUR (IV SOLUTION) ×6 IMPLANT

## 2021-04-15 NOTE — Anesthesia Procedure Notes (Signed)
Procedure Name: Intubation Date/Time: 04/15/2021 2:50 PM Performed by: Babs Bertin, CRNA Pre-anesthesia Checklist: Patient identified, Emergency Drugs available, Suction available and Patient being monitored Patient Re-evaluated:Patient Re-evaluated prior to induction Oxygen Delivery Method: Circle System Utilized Preoxygenation: Pre-oxygenation with 100% oxygen Induction Type: IV induction and Rapid sequence Laryngoscope Size: Mac and 3 Grade View: Grade II Tube type: Oral Tube size: 8.0 mm Number of attempts: 1 Airway Equipment and Method: Stylet and Oral airway Placement Confirmation: ETT inserted through vocal cords under direct vision, positive ETCO2 and breath sounds checked- equal and bilateral Secured at: 22 cm Tube secured with: Tape Dental Injury: Teeth and Oropharynx as per pre-operative assessment

## 2021-04-15 NOTE — Anesthesia Preprocedure Evaluation (Signed)
Anesthesia Evaluation    Airway Mallampati: II  TM Distance: >3 FB Neck ROM: Full    Dental no notable dental hx.    Pulmonary    Pulmonary exam normal breath sounds clear to auscultation       Cardiovascular hypertension, + angina + CAD, + Past MI, + Cardiac Stents and +CHF  + Valvular Problems/Murmurs AI  Rhythm:Regular Rate:Normal + Systolic murmurs Left ventricular ejection fraction, by estimation, is 45%. Left  ventricular ejection fraction by 2D MOD biplane is 45.2 %. The left  ventricle has mildly decreased function. The left ventricle demonstrates  regional wall motion abnormalities (Apical  hypokinesis without LV thrombus). Left ventricular diastolic parameters  are indeterminate.  2. Right ventricular systolic function is normal. The right ventricular  size is normal. Tricuspid regurgitation signal is inadequate for assessing  PA pressure.  3. The mitral valve is grossly normal. Trivial mitral valve  regurgitation.  4. The aortic valve is tricuspid. Aortic valve regurgitation is mild to  moderate. No aortic stenosis is present.  5. Aortic dilatation noted. There is mild dilatation of the aortic root,  measuring 43 mm.  6. The inferior vena cava is normal in size with greater than 50%  respiratory variability, suggesting right atrial pressure of 3 mmHg   Neuro/Psych    GI/Hepatic   Endo/Other    Renal/GU      Musculoskeletal   Abdominal   Peds  Hematology   Anesthesia Other Findings   Reproductive/Obstetrics                             Anesthesia Physical Anesthesia Plan  ASA: 3  Anesthesia Plan: General   Post-op Pain Management:    Induction: Intravenous  PONV Risk Score and Plan: 2 and Ondansetron, Dexamethasone and Treatment may vary due to age or medical condition  Airway Management Planned: Oral ETT  Additional Equipment: Arterial line, CVP, TEE and  Ultrasound Guidance Line Placement  Intra-op Plan:   Post-operative Plan: Extubation in OR  Informed Consent: I have reviewed the patients History and Physical, chart, labs and discussed the procedure including the risks, benefits and alternatives for the proposed anesthesia with the patient or authorized representative who has indicated his/her understanding and acceptance.     Dental advisory given  Plan Discussed with: CRNA and Surgeon  Anesthesia Plan Comments:         Anesthesia Quick Evaluation

## 2021-04-15 NOTE — Anesthesia Postprocedure Evaluation (Signed)
Anesthesia Post Note  Patient: Dwayne [name]  Procedure(s) Performed: CORONARY ARTERY BYPASS GRAFTING (CABG) x 3 ON CARDIOPULMONARY BYPASS (Chest) AORTIC VALVE REPLACEMENT (AVR) USING 27 MM INSPIRIS RESILIA  AORTIC VALVE (Chest) ENDOVEIN HARVEST OF GREATER SAPHENOUS VEIN (Right: Leg Upper) APPLICATION OF CELL SAVER (Chest)     Patient location during evaluation: SICU Anesthesia Type: General Level of consciousness: sedated Pain management: pain level controlled Vital Signs Assessment: post-procedure vital signs reviewed and stable Respiratory status: patient remains intubated per anesthesia plan Cardiovascular status: stable Postop Assessment: no apparent nausea or vomiting Anesthetic complications: no   No notable events documented.  Last Vitals:  Vitals:   04/15/21 0835 04/15/21 1357  BP: (!) 157/94   Pulse: 82 84  Resp: 18 18  Temp: 36.9 C   SpO2:  97%    Last Pain:  Vitals:   04/15/21 1357  TempSrc:   PainSc: 0-No pain                 Belenda Cruise P Aundrea Horace

## 2021-04-15 NOTE — Anesthesia Procedure Notes (Signed)
Central Venous Catheter Insertion Performed by: Myrtie Soman, MD, anesthesiologist Start/End11/17/2022 2:00 PM, 04/15/2021 2:15 PM Patient location: Pre-op. Preanesthetic checklist: patient identified, IV checked, site marked, risks and benefits discussed, surgical consent, monitors and equipment checked, pre-op evaluation, timeout performed and anesthesia consent Position: Trendelenburg Lidocaine 1% used for infiltration and patient sedated Hand hygiene performed  and maximum sterile barriers used  Catheter size: 8.5 Fr Central line was placed.Sheath introducer Procedure performed using ultrasound guided technique. Ultrasound Notes:anatomy identified, needle tip was noted to be adjacent to the nerve/plexus identified, no ultrasound evidence of intravascular and/or intraneural injection and image(s) printed for medical record Attempts: 1 Following insertion, line sutured, dressing applied and Biopatch. Post procedure assessment: blood return through all ports, free fluid flow and no air  Patient tolerated the procedure well with no immediate complications.

## 2021-04-15 NOTE — Progress Notes (Signed)
ANTICOAGULATION CONSULT NOTE - Follow up  Pharmacy Consult for Heparin Indication: chest pain/ACS  Allergies  Allergen Reactions   Codeine Nausea Only   Lipitor [Atorvastatin] Other (See Comments)    Myalgia    Patient Measurements: Height: 5\' 10"  (177.8 cm) Weight: 101.6 kg (224 lb) IBW/kg (Calculated) : 73 Heparin Dosing Weight: 95 kg  Vital Signs: Temp: 98.3 F (36.8 C) (11/17 0634) Temp Source: Oral (11/17 0634) BP: 149/97 (11/17 0634) Pulse Rate: 84 (11/17 0634)  Labs: Recent Labs    04/12/21 1155 04/12/21 1404 04/13/21 0342 04/14/21 0246 04/14/21 1313 04/15/21 0135  HGB 13.0  --  12.0* 10.5*  --  11.2*  HCT 37.4*  --  34.2* 30.6*  --  32.0*  PLT 264  --  205 194  --  177  HEPARINUNFRC  --   --   --   --  0.26* 0.45  CREATININE 1.18  --  0.86 0.71  --  0.74  TROPONINIHS 10 9  --   --   --   --      Estimated Creatinine Clearance: 107 mL/min (by C-G formula based on SCr of 0.74 mg/dL).   Medical History: Past Medical History:  Diagnosis Date   Acute ST elevation myocardial infarction (STEMI) involving left anterior descending (LAD) coronary artery (HCC)    Adenomatous colon polyp 2000   Arthritis    Coronary artery disease involving native coronary artery of native heart without angina pectoris 03/23/2016   Diverticulosis    Erectile dysfunction    History of stroke 01/23/2017   Hypercholesteremia    Hypertension    Internal hemorrhoids    Kidney stones    Myocardial infarction [facility])    Occipital cerebral infarction ([place]) 03/23/2016   Splenomegaly     Medications:  Medications Prior to Admission  Medication Sig Dispense Refill Last Dose   AMBULATORY NON FORMULARY MEDICATION Apply 1 application topically daily as needed (pain). Medication Name: CBD Oil - daily   Past Month   aspirin EC 81 MG tablet Take by mouth.   04/12/2021   carvedilol (COREG) 25 MG tablet TAKE 1 TABLET BY MOUTH 2 (TWO) TIMES DAILY. PLEASE KEEP UPCOMING APPT IN [facility] 2022  180 tablet 3 04/12/2021 at 0545   lisinopril (ZESTRIL) 5 MG tablet TAKE 1 TABLET BY MOUTH EVERY DAY 90 tablet 3 04/12/2021   Multiple Vitamin (MULTIVITAMIN WITH MINERALS) TABS tablet Take 1 tablet by mouth daily.   Past Week   nitroGLYCERIN (NITROSTAT) 0.4 MG SL tablet Place 1 tablet (0.4 mg total) under the tongue every 5 (five) minutes as needed for chest pain. 25 tablet 3 04/12/2021 at 1030   ondansetron (ZOFRAN) 4 MG tablet Take 4 mg by mouth every 8 (eight) hours as needed for nausea/vomiting, nausea or vomiting.   unknown   oxyCODONE (OXY IR/ROXICODONE) 5 MG immediate release tablet Take 5 mg by mouth every 6 (six) hours as needed for pain or moderate pain.   04/11/2021   rosuvastatin (CRESTOR) 40 MG tablet Take 40 mg by mouth daily.   04/12/2021   terbinafine (LAMISIL) 250 MG tablet Take 1 tablet (250 mg total) by mouth daily. (Patient taking differently: Take 250 mg by mouth every evening.) 90 tablet 0 04/11/2021   tiZANidine (ZANAFLEX) 2 MG tablet Take 4 mg by mouth every 8 (eight) hours as needed for muscle spasms.   unknown   aspirin 81 MG chewable tablet Chew 1 tablet (81 mg total) by mouth daily. (Patient not taking: No  sig reported)   Completed Course    Assessment:  65 y.o male presents to ER on 04/12/21 with chest pain.   Pharmacy consulted to start IV heparin for ACS/ STEMI.   Patient is not taking anticoagulation prior to admit.  SQ heparin 5k given this am @0547 >I have discontinued.  Heparin bolus and heparin drip at 1250 units/hr started this morning.   S/p cardiac cath 04/13/21 with severe multivessel CAD: 80% fistal LM disease, 30% LAD, 90% D1.   Heparin infusion resumed post cath 11/15.  Today's heparin level 0.45 is  therapeutic  on heparin 1450 units/hr.  No bleeding reported Hgb stable 11.2,  pltc 194>177 Plan for CABG 11/17 @ 15:00 today.  Goal of Therapy:  Heparin level 0.3-0.7 units/ml Monitor platelets by anticoagulation protocol: Yes   Plan:  Continue Heparin  drip to 1450 units/hr Plan for CABG this afternoon. Monitor for s/sx of bleeding    Nicole Cella, [place] Clinical Pharmacist [phone] 04/15/2021,7:55 AM Please check AMION for all Center phone numbers After 10:00 PM, call [place] [phone]

## 2021-04-15 NOTE — Progress Notes (Signed)
     [address]       [place] [address]             [phone]       No events No chest pain on the nitro gtt  Vitals:   04/15/21 0634 04/15/21 0835  BP: (!) 149/97 (!) 157/94  Pulse: 84 82  Resp: 18 18  Temp: 98.3 F (36.8 C) 98.5 F (36.9 C)  SpO2: 96%    Alert NAD Sinus  EWOB  OR today for CABG, possible bAVR  Rojelio Uhrich O Shana Younge

## 2021-04-15 NOTE — Brief Op Note (Addendum)
04/12/2021 - 04/15/2021  6:56 PM  PATIENT:  Dwayne [name]  65 y.o. male  PRE-OPERATIVE DIAGNOSIS:  1. Coronary artery disease 2. Moderate Aortic Insufficiency  POST-OPERATIVE DIAGNOSIS:  1. Coronary artery disease 2. Moderate Aortic insufficiency  PROCEDURE:  CORONARY ARTERY BYPASS GRAFTING (CABG) x 3 (LIMA to LAD, SVG to DIAGONAL, SVG to OM), Flow trac AORTIC VALVE REPLACEMENT (AVR) ( USING INSPIRIS RESILIA  Model 11500A, Serial # [id number], Size 27 mm), ENDOVEIN HARVEST OF RIGHT GREATER SAPHENOUS VEIN, APPLICATION OF CELL SAVER  EVH HARVEST TIME: 19 minutes; EVH PREP TIME: 14 minutes  SURGEON:  Surgeon(s) and Role:    Lightfoot, Lucile Crater, MD - Primary  PHYSICIAN ASSISTANT: Lars Pinks PA-C  ASSISTANTS: Farrel Gordon RNFA  ANESTHESIA:   general  EBL:  Per perfusion, anesthesia record  DRAINS:  Chest tubes placed in the mediastinal and pleural spaces    SPECIMEN:  Source of Specimen:  Native AV leaflets  DISPOSITION OF SPECIMEN:  PATHOLOGY  COUNTS CORRECT:  YES  DICTATION: .Dragon Dictation  PLAN OF CARE: Admit to inpatient   PATIENT DISPOSITION:  ICU - intubated and hemodynamically stable.   Delay start of Pharmacological VTE agent (>24hrs) due to surgical blood loss or risk of bleeding: yes  BASELINE WEIGHT: 101.6 kg

## 2021-04-15 NOTE — Transfer of Care (Signed)
Immediate Anesthesia Transfer of Care Note  Patient: Dwayne [name]  Procedure(s) Performed: CORONARY ARTERY BYPASS GRAFTING (CABG) x 3 ON CARDIOPULMONARY BYPASS (Chest) AORTIC VALVE REPLACEMENT (AVR) USING 27 MM INSPIRIS RESILIA  AORTIC VALVE (Chest) ENDOVEIN HARVEST OF GREATER SAPHENOUS VEIN (Right: Leg Upper) APPLICATION OF CELL SAVER (Chest)  Patient Location: SICU  Anesthesia Type:General  Level of Consciousness: Patient remains intubated per anesthesia plan  Airway & Oxygen Therapy: Patient remains intubated per anesthesia plan and Patient placed on Ventilator (see vital sign flow sheet for setting)  Post-op Assessment: Report given to RN and Post -op Vital signs reviewed and stable  Post vital signs: Reviewed and stable  Last Vitals:  Vitals Value Taken Time  BP 91/70   Temp 37   Pulse 79 04/15/21 2046  Resp 12 04/15/21 2046  SpO2 99 % 04/15/21 2046  Vitals shown include unvalidated device data.  Last Pain:  Vitals:   04/15/21 1357  TempSrc:   PainSc: 0-No pain      Patients Stated Pain Goal: 1 (04/59/97 7414)  Complications: No notable events documented.

## 2021-04-15 NOTE — Op Note (Signed)
[facility] [name] [address]       Dwayne [name],Dwayne [name] [id number]             [phone]                                          04/15/2021 Patient:  Dwayne [name] Pre-Op Dx: Left main coronary artery disease   Unstable angina   History of previous PCI   Moderate aortic regurgitation Post-op Dx: Same Procedure: CABG X 3.  LIMA to LAD, reverse saphenous vein graft to obtuse marginal, reverse saphenous vein graft to first diagonal. Endoscopic greater saphenous vein harvest on the right Aortic valve replacement 27 mm Inspiris Resilia valve  Surgeon and Role:      * Kerilyn Cortner, Lucile Crater, MD - Primary    *D. Tacy Dura, PA-C-provided assistance for vein harvesting as well as valve placement.  Anesthesia  general EBL:  1054ml Blood Administration: none Xclamp Time: 123 min Pump Time: 159 min  Drains: 19 F blake drain: L, mediastinal  Wires: Ventricular Counts: correct   Indications: This is a 65 year old gentleman that presented to the hospital for unstable angina.  He has a history of previous PCI to the LAD, and left heart cath showed left main coronary artery disease.  He continued to have chest pain while in the hospital despite being on a heparin and nitroglycerin drip.  Additionally his echocardiogram showed moderate aortic valve regurgitation.  He was taken to the operating room for surgical revascularization and aortic valve replacement.  Findings: Good vein conduit.  Good LIMA.  Good LAD, diagonal and obtuse marginal targets.  Good flows on all vein grafts.  On inspection of the aortic valve there was a perforation in the right/noncoronary commissure.  His aorta was mildly aneurysmal however we were able to fit a 27 mm valve.  Operative Technique: All invasive lines were placed in pre-op holding.  After the risks, benefits and alternatives were thoroughly discussed, the patient was brought to the operative theatre.  Anesthesia was induced, and the patient was prepped and  draped in normal sterile fashion.  An appropriate surgical pause was performed, and pre-operative antibiotics were dosed accordingly.  We began with simultaneous incisions along the right leg for harvesting of the greater saphenous vein and the chest for the sternotomy.  In regards to the sternotomy, this was carried down with bovie cautery, and the sternum was divided with a reciprocating saw.  Meticulous hemostasis was obtained.  The left internal thoracic artery was exposed and harvested in in pedicled fashion.  The patient was systemically heparinized, and the artery was divided distally, and placed in a papaverine sponge.    The sternal elevator was removed, and a retractor was placed.  The pericardium was divided in the midline and fashioned into a cradle with pericardial stitches.   After we confirmed an appropriate ACT, the ascending aorta was cannulated in standard fashion.  The right atrial appendage was used for venous cannulation site.  Cardiopulmonary bypass was initiated, and the heart retractor was placed. The cross clamp was applied, and a dose of anterograde cardioplegia was given with good arrest of the heart.  Next we exposed the lateral wall, and found a good target on the obtuse marginal.  An end to side anastomosis with the vein graft was then created.  Next, we exposed the anterior wall of the  heart and identified a good target on diagonal.   An arteriotomy was created.  The vein was anastomosed in an end to side fashion.  Finally, we exposed a good target on the LAD, and fashioned an end to side anastomosis between it and the LITA.  The aorta was then opened and a direct dose of cardioplegia was given down the right and left coronary ostium.  The valve was inspected and there was a perforation along the right coronary leaflet by the commissure of the noncoronary cusp.  The leaflets were excised.  Pledgeted mattress sutures were placed along the annulus after sizing it to a 27 mm  bioprosthetic valve.  The valve was seated and secured with cor knot sutures.  We began to rewarm, and the aortotomy was closed with 4-0 Prolene sutures in 2 layers.  A proximal anastomosis between the aorta and the obtuse marginal vein graft was then created.  A dose of warm antegrade cardioplegia was given, and after thoroughly de-airing the heart the cross-clamp was removed.  The diagonal vein graft was then jumped off the hood of the obtuse marginal vein graft.  The proximal site was marked with a ring.  Hemostasis was obtained, and we separated from cardiopulmonary bypass without event.  The heparin was reversed with protamine.  Chest tubes and wires were placed, and the sternum was re-approximated with sternal wires.  The soft tissue and skin were re-approximated wth absorbable suture.    The patient tolerated the procedure without any immediate complications, and was transferred to the ICU in guarded condition.  Dwayne [name]

## 2021-04-15 NOTE — Anesthesia Procedure Notes (Signed)
Anesthesia Procedure Image    

## 2021-04-15 NOTE — Progress Notes (Signed)
VASCULAR LAB    Pre CABG Dopplers have been performed.  See CV proc for preliminary results.   Arseniy Toomey, RVT 04/15/2021, 10:19 AM

## 2021-04-15 NOTE — Anesthesia Procedure Notes (Signed)
Arterial Line Insertion Start/End11/17/2022 1:50 PM, 04/15/2021 1:55 PM Performed by: Betha Loa, CRNA, CRNA  Patient location: Pre-op. Preanesthetic checklist: patient identified, IV checked, surgical consent, monitors and equipment checked and pre-op evaluation Lidocaine 1% used for infiltration Left, radial was placed Catheter size: 20 G Hand hygiene performed  and maximum sterile barriers used   Attempts: 1 Procedure performed without using ultrasound guided technique. Following insertion, Biopatch and dressing applied.

## 2021-04-15 NOTE — Progress Notes (Signed)
CARDIAC REHAB PHASE I   Discussed IS (2500+ml), sternal precautions, mobility post op and d/c planning. Pt receptive. He just had right THR 3 weeks ago but sts he is moving well, walking without AD. His wife will be with him at d/c. Has [facility] booklet, declined preop video. [place], ACSM 04/15/2021 9:16 AM

## 2021-04-15 NOTE — Hospital Course (Addendum)
HPI: Patient is a 65 year old male with a well-known history of coronary artery disease having had a previous STEMI in 2017 involving the LAD with known ischemic cardiomyopathy.  Initial ejection fraction was in the 25 to 30% but it did improve to 40 to 45% following PCI.  He presented 2 days ago to the emergency department with chest pain.  He was last seen by cardiology on 03/17/2021 where it was felt that he was doing well and was compensated from a heart failure perspective with no significant ischemic symptoms.  The patient did report an episode of chest pain approximately 1 month ago described as a substernal chest pressure while packing his car from [place].  It resolved with rest and sublingual nitroglycerin after approximately 10 minutes.  Since that time he reports intermittent substernal chest tightness that resolves with rest.  None of the episodes were felt to be severe in nature however on the date of presentation while taking the trash out the episode was felt to be much more significant in terms of the chest tightness.  He took 1 sublingual nitroglycerin and showed improvement but decided to come to the ER for further evaluation.  Initial EKG showed sinus rhythm with no ischemic changes.  Initial high-sensitivity troponin was 10 and subsequent was 9.  As the patient is known to have residual disease in left main, RCA and circumflex following his previous PCI of the LAD and exertional symptoms it was felt that he should undergo cardiac catheterization.  Additional cardiac risk factors include erectile dysfunction, history of CVA, hypercholesterolemia as well as hypertension.  Echocardiogram showed the LVEF to be 45%.  Please see full report listed below.  Cardiac catheterization revealed significant left main and multivessel coronary artery disease.  Please see the full report listed below.  We are asked to see the patient in cardiothoracic surgical consultation for consideration of  revascularization. Dr. Kipp Brood discussed the need for coronary artery bypass grafting surgery. Potential risks, benefits, and complications of the surgery were discussed with the patient and he agreed to proceed with surgeon. Pre operative carotid duplex US showed no significant internal carotid artery stenosis bilaterally.  Hospital Course: Patient underwent a CABG x 3 and aortic valve replacement on 04/15/2021. He was transferred from the OR to [facility] ICU in stable condition.  He was extubated on POD #1.  He underwent debridement of chest tubes and arterial lines.  Pain control was an issue and he was started on prn Toradol with some relief.  He had persistent nausea and reglan was added. The patient's nausea improved.  He was maintaining NSR with bigeminy at times and was felt stable for transfer the progressive care unit on 04/18/2021.  His nausea did resolve on 11/21. He has been tolerating a diet and had a bowel movement. Lopressor was increased to 25 mg bid for better control of HR and BP. He is ambulating on room air. His wounds are clean, dry, and healing without signs of infection. Epicardial pacing wires were removed on 11/21. He is surgically stable for discharge today.

## 2021-04-16 ENCOUNTER — Encounter (HOSPITAL_COMMUNITY): Payer: Self-pay | Admitting: Thoracic Surgery (Cardiothoracic Vascular Surgery)

## 2021-04-16 ENCOUNTER — Other Ambulatory Visit: Payer: Self-pay | Admitting: Cardiology

## 2021-04-16 ENCOUNTER — Inpatient Hospital Stay (HOSPITAL_COMMUNITY): Payer: PPO

## 2021-04-16 DIAGNOSIS — I25110 Atherosclerotic heart disease of native coronary artery with unstable angina pectoris: Secondary | ICD-10-CM | POA: Diagnosis not present

## 2021-04-16 DIAGNOSIS — J9601 Acute respiratory failure with hypoxia: Secondary | ICD-10-CM | POA: Diagnosis not present

## 2021-04-16 DIAGNOSIS — I255 Ischemic cardiomyopathy: Secondary | ICD-10-CM | POA: Diagnosis not present

## 2021-04-16 DIAGNOSIS — I10 Essential (primary) hypertension: Secondary | ICD-10-CM | POA: Diagnosis not present

## 2021-04-16 DIAGNOSIS — I200 Unstable angina: Secondary | ICD-10-CM | POA: Diagnosis not present

## 2021-04-16 DIAGNOSIS — I350 Nonrheumatic aortic (valve) stenosis: Secondary | ICD-10-CM

## 2021-04-16 LAB — POCT I-STAT 7, (LYTES, BLD GAS, ICA,H+H)
Acid-Base Excess: 1 mmol/L (ref 0.0–2.0)
Acid-Base Excess: 1 mmol/L (ref 0.0–2.0)
Acid-Base Excess: 1 mmol/L (ref 0.0–2.0)
Bicarbonate: 27.2 mmol/L (ref 20.0–28.0)
Bicarbonate: 25.3 mmol/L (ref 20.0–28.0)
Bicarbonate: 25 mmol/L (ref 20.0–28.0)
Calcium, Ion: 1.27 mmol/L (ref 1.15–1.40)
Calcium, Ion: 1.15 mmol/L (ref 1.15–1.40)
Calcium, Ion: 1.13 mmol/L — ABNORMAL LOW (ref 1.15–1.40)
HCT: 29 % — ABNORMAL LOW (ref 39.0–52.0)
HCT: 25 % — ABNORMAL LOW (ref 39.0–52.0)
HCT: 26 % — ABNORMAL LOW (ref 39.0–52.0)
Hemoglobin: 9.9 g/dL — ABNORMAL LOW (ref 13.0–17.0)
Hemoglobin: 8.5 g/dL — ABNORMAL LOW (ref 13.0–17.0)
Hemoglobin: 8.8 g/dL — ABNORMAL LOW (ref 13.0–17.0)
O2 Saturation: 98 %
O2 Saturation: 99 %
O2 Saturation: 98 %
Patient temperature: 37
Patient temperature: 37.5
Patient temperature: 37.4
Potassium: 3.5 mmol/L (ref 3.5–5.1)
Potassium: 4.4 mmol/L (ref 3.5–5.1)
Potassium: 4.4 mmol/L (ref 3.5–5.1)
Sodium: 140 mmol/L (ref 135–145)
Sodium: 139 mmol/L (ref 135–145)
Sodium: 138 mmol/L (ref 135–145)
TCO2: 29 mmol/L (ref 22–32)
TCO2: 27 mmol/L (ref 22–32)
TCO2: 26 mmol/L (ref 22–32)
pCO2 arterial: 47.4 mmHg (ref 32.0–48.0)
pCO2 arterial: 40.4 mmHg (ref 32.0–48.0)
pCO2 arterial: 38.8 mmHg (ref 32.0–48.0)
pH, Arterial: 7.367 (ref 7.350–7.450)
pH, Arterial: 7.408 (ref 7.350–7.450)
pH, Arterial: 7.419 (ref 7.350–7.450)
pO2, Arterial: 110 mmHg — ABNORMAL HIGH (ref 83.0–108.0)
pO2, Arterial: 132 mmHg — ABNORMAL HIGH (ref 83.0–108.0)
pO2, Arterial: 110 mmHg — ABNORMAL HIGH (ref 83.0–108.0)

## 2021-04-16 LAB — CBC
HCT: 25.8 % — ABNORMAL LOW (ref 39.0–52.0)
HCT: 26.4 % — ABNORMAL LOW (ref 39.0–52.0)
Hemoglobin: 9.2 g/dL — ABNORMAL LOW (ref 13.0–17.0)
Hemoglobin: 9.2 g/dL — ABNORMAL LOW (ref 13.0–17.0)
MCH: 34.1 pg — ABNORMAL HIGH (ref 26.0–34.0)
MCH: 33.7 pg (ref 26.0–34.0)
MCHC: 35.7 g/dL (ref 30.0–36.0)
MCHC: 34.8 g/dL (ref 30.0–36.0)
MCV: 95.6 fL (ref 80.0–100.0)
MCV: 96.7 fL (ref 80.0–100.0)
Platelets: 129 10*3/uL — ABNORMAL LOW (ref 150–400)
Platelets: 131 10*3/uL — ABNORMAL LOW (ref 150–400)
RBC: 2.7 MIL/uL — ABNORMAL LOW (ref 4.22–5.81)
RBC: 2.73 MIL/uL — ABNORMAL LOW (ref 4.22–5.81)
RDW: 12.8 % (ref 11.5–15.5)
RDW: 13.1 % (ref 11.5–15.5)
WBC: 8.6 10*3/uL (ref 4.0–10.5)
WBC: 10.4 10*3/uL (ref 4.0–10.5)
nRBC: 0 % (ref 0.0–0.2)
nRBC: 0 % (ref 0.0–0.2)

## 2021-04-16 LAB — BASIC METABOLIC PANEL
Anion gap: 7 (ref 5–15)
Anion gap: 8 (ref 5–15)
BUN: 6 mg/dL — ABNORMAL LOW (ref 8–23)
BUN: 7 mg/dL — ABNORMAL LOW (ref 8–23)
CO2: 23 mmol/L (ref 22–32)
CO2: 22 mmol/L (ref 22–32)
Calcium: 7.6 mg/dL — ABNORMAL LOW (ref 8.9–10.3)
Calcium: 7.7 mg/dL — ABNORMAL LOW (ref 8.9–10.3)
Chloride: 106 mmol/L (ref 98–111)
Chloride: 106 mmol/L (ref 98–111)
Creatinine, Ser: 0.81 mg/dL (ref 0.61–1.24)
Creatinine, Ser: 0.89 mg/dL (ref 0.61–1.24)
GFR, Estimated: >60 mL/min (ref >60)
GFR, Estimated: >60 mL/min (ref >60)
Glucose, Bld: 136 mg/dL — ABNORMAL HIGH (ref 70–99)
Glucose, Bld: 122 mg/dL — ABNORMAL HIGH (ref 70–99)
Potassium: 4.5 mmol/L (ref 3.5–5.1)
Potassium: 4.1 mmol/L (ref 3.5–5.1)
Sodium: 136 mmol/L (ref 135–145)
Sodium: 136 mmol/L (ref 135–145)

## 2021-04-16 LAB — GLUCOSE, CAPILLARY
Glucose-Capillary: 120 mg/dL — ABNORMAL HIGH (ref 70–99)
Glucose-Capillary: 128 mg/dL — ABNORMAL HIGH (ref 70–99)
Glucose-Capillary: 128 mg/dL — ABNORMAL HIGH (ref 70–99)
Glucose-Capillary: 138 mg/dL — ABNORMAL HIGH (ref 70–99)
Glucose-Capillary: 157 mg/dL — ABNORMAL HIGH (ref 70–99)
Glucose-Capillary: 134 mg/dL — ABNORMAL HIGH (ref 70–99)
Glucose-Capillary: 140 mg/dL — ABNORMAL HIGH (ref 70–99)
Glucose-Capillary: 120 mg/dL — ABNORMAL HIGH (ref 70–99)
Glucose-Capillary: 121 mg/dL — ABNORMAL HIGH (ref 70–99)
Glucose-Capillary: 115 mg/dL — ABNORMAL HIGH (ref 70–99)
Glucose-Capillary: 135 mg/dL — ABNORMAL HIGH (ref 70–99)
Glucose-Capillary: 121 mg/dL — ABNORMAL HIGH (ref 70–99)
Glucose-Capillary: 137 mg/dL — ABNORMAL HIGH (ref 70–99)
Glucose-Capillary: 148 mg/dL — ABNORMAL HIGH (ref 70–99)
Glucose-Capillary: 131 mg/dL — ABNORMAL HIGH (ref 70–99)

## 2021-04-16 LAB — ECHO INTRAOPERATIVE TEE
AV Mean grad: 3 mmHg
AV Peak grad: 5.2 mmHg
Ao pk vel: 1.14 m/s
Height: 70 in
P 1/2 time: 413 msec
Single Plane A2C EF: 45.1 %
Weight: 3584 oz

## 2021-04-16 LAB — MAGNESIUM
Magnesium: 2.6 mg/dL — ABNORMAL HIGH (ref 1.7–2.4)
Magnesium: 2.1 mg/dL (ref 1.7–2.4)

## 2021-04-16 MED ORDER — SODIUM CHLORIDE 0.9% FLUSH
10.0000 mL | Freq: Two times a day (BID) | INTRAVENOUS | Status: DC
  Administered 2021-04-16 – 2021-04-17 (×3): 10 mL
  Administered 2021-04-17: 40 mL
  Administered 2021-04-18: 10 mL

## 2021-04-16 MED ORDER — CEFAZOLIN SODIUM-DEXTROSE 2-4 GM/100ML-% IV SOLN
2.0000 g | Freq: Three times a day (TID) | INTRAVENOUS | Status: AC
  Administered 2021-04-16 – 2021-04-17 (×4): 2 g via INTRAVENOUS
  Filled 2021-04-16 (×4): qty 100

## 2021-04-16 MED ORDER — INSULIN ASPART 100 UNIT/ML IJ SOLN
0.0000 [IU] | INTRAMUSCULAR | Status: DC
Start: 2021-04-16 — End: 2021-04-16
  Administered 2021-04-16: 2 [IU] via SUBCUTANEOUS

## 2021-04-16 MED ORDER — METHOCARBAMOL 500 MG PO TABS
500.0000 mg | ORAL_TABLET | Freq: Three times a day (TID) | ORAL | Status: DC
  Administered 2021-04-16 – 2021-04-20 (×11): 500 mg via ORAL
  Filled 2021-04-16 (×11): qty 1

## 2021-04-16 MED ORDER — SODIUM CHLORIDE 0.9% FLUSH: 10.0000 mL | INTRAVENOUS | Status: DC | PRN

## 2021-04-16 MED ORDER — ENOXAPARIN SODIUM 40 MG/0.4ML IJ SOSY
40.0000 mg | PREFILLED_SYRINGE | Freq: Every day | INTRAMUSCULAR | Status: DC
  Administered 2021-04-16 – 2021-04-19 (×4): 40 mg via SUBCUTANEOUS
  Filled 2021-04-16 (×4): qty 0.4

## 2021-04-16 MED ORDER — INSULIN ASPART 100 UNIT/ML IJ SOLN
0.0000 [IU] | INTRAMUSCULAR | Status: DC
  Administered 2021-04-16 – 2021-04-17 (×4): 2 [IU] via SUBCUTANEOUS

## 2021-04-16 NOTE — Procedures (Signed)
Extubation Procedure Note  Patient Details:   Name: ALEXY HELDT DOB: 1955/07/12 MRN: [id number]   Airway Documentation:    Vent end date: 04/16/21 Vent end time: 0843   Evaluation  O2 sats: stable throughout Complications: No apparent complications Patient did tolerate procedure well. Bilateral Breath Sounds: Clear, Diminished   Yes  Patient extubated per rapid wean protocol. Positive cuff leak. NIF -30, VC 1..4L. Vitals stable on 3L [place]. RN at bedside.  Herbie Saxon Krystall Kruckenberg 04/16/2021, 8:47 AM

## 2021-04-16 NOTE — Consult Note (Signed)
NAME:  Dwayne [name], MRN:  [id number], DOB:  01-Oct-1955, LOS: 3 ADMISSION DATE:  04/12/2021, CONSULTATION DATE:  11/18 REFERRING MD:  Kipp Brood, CHIEF COMPLAINT:  chest pain  History of Present Illness:  AHAN [name] is Dwayne 65 y.o. who presented to [facility] on 11/14 with chest pain which had been intermittent over the past month but worsened the am on 11/14. He was admitted to the cardiologist service. Dwayne heart cath revealed significant left main and multivessel CAD. CTCS was consulted. He underwent Dwayne CABG x3 with LIMA-LAD,  AVR with inspiris resila 4mm bioprosthetic valve on 11/18.  He is POD day 1 s/p CABG X3 with AVR. He was extubated the AM of 11/18  PCCM was consulted for assistance while in the ICU.   Pertinent  Medical History  CAD, LAD STEMI S/P PCI, HTN, HLD  Significant Hospital Events: Including procedures, antibiotic start and stop dates in addition to other pertinent events   11/14 presented with unstable angina 11/18 CABG x3 with LIMA-LAD,  AVR with inspiris resila 57mm bioprosthetic valve on 11/18.  Interim History / Subjective:  See above  Extubated  40 of phenylephrine, insulin gtt  Subjective: Some complaints of surgical chest pain, no sob, overall comfortable.   Objective   Blood pressure 102/69, pulse 67, temperature 99.3 F (37.4 C), resp. rate 12, height 5\' 10"  (1.778 m), weight 107 kg, SpO2 98 %. On 1lnc    Vent Mode: PSV;CPAP FiO2 (%):  [40 %-50 %] 40 % Set Rate:  [4 bmp-12 bmp] 4 bmp Vt Set:  [580 mL] 580 mL PEEP:  [5 cmH20] 5 cmH20 Pressure Support:  [10 cmH20] 10 cmH20 Plateau Pressure:  [8 cmH20-19 cmH20] 8 cmH20   Intake/Output Summary (Last 24 hours) at 04/16/2021 1036 Last data filed at 04/16/2021 1000 Gross per 24 hour  Intake 4708.78 ml  Output 4620 ml  Net 88.78 ml   Filed Weights   04/14/21 0402 04/15/21 0634 04/16/21 0500  Weight: 104.4 kg 101.6 kg 107 kg    Examination: General: In bed, NAD, appears comfortable HEENT: MM  pink/moist, anicteric, atraumatic Neuro: RASS 0, PERRL 14mm, GCS 15 CV: S1S2, NSR, no m/r/g appreciated PULM:  clear in the upper lobes, clear in the lower lobes, trachea midline, chest expansion symmetric, CT to -20, no air leak GI: soft, bsx4 active, non-tender   Extremities: warm/dry, no pretibial edema, capillary refill less than 3 seconds  Skin: surgical sites as expected, no rashes or lesions noted   Resolved Hospital Problem list     Assessment & Plan:  POD 1 S/P CABG x3 with LIMA-LAD,  AVR with inspiris resila 10mm bioprosthetic valve Unstable angina Left main disease Hx of STEMI s/p LAD PCI Ischemic CM with LVEF 50-45% Mild-to-moderate AR s/p AVR HX HLD HX HTN Aortic Aneurysm CI remains in mid 2's. Pacing wires remain. Weaning neo. -Management per CTCS and cardiology -Continue chest tubes -On ASA, plan to start plavix when pacing wires out. -Continue statin -Scheduled metoprolol per protocol with hold parameters  -Continue bowel regimen -will need outpatient monitoring for AA.   Acute respiratory failure s/p cardiac surgery Extubated on 11/18 to 3L, down to 1L -Wean O2, goal sat 92 to 98% -Pulm toilet as able -Mobilize per protocol  ABLA, secondary to surgery Hgb 9.9>9.2 -Transfuse PRBC if HBG less than 7 -Obtain AM CBC to trend H&H -Monitor for signs of bleeding  Glucose control -Remains on insulin gtt -transition to SSI per CTCS protocol when approproate.  Best Practice (right click and "Reselect all SmartList Selections" daily)   Diet/type: NPO, progression per protocol DVT prophylaxis: SCD GI prophylaxis: PPI Lines: Central line and yes and it is still needed Foley:  Yes, and it is still needed Code Status:  full code Last date of multidisciplinary goals of care discussion [per primary]  Labs   CBC: Recent Labs  Lab 04/13/21 0342 04/14/21 0246 04/15/21 0135 04/15/21 1500 04/15/21 1821 04/15/21 1827 04/15/21 2049 04/15/21 2050  04/16/21 0303 04/16/21 0836 04/16/21 0953  WBC 7.6 7.9 5.9  --   --   --  14.3*  --  8.6  --   --   HGB 12.0* 10.5* 11.2*   < > 8.5*   < > 11.2* 9.9* 9.2* 8.5* 8.8*  HCT 34.2* 30.6* 32.0*   < > 24.5*   < > 31.1* 29.0* 25.8* 25.0* 26.0*  MCV 95.5 96.5 95.5  --   --   --  94.5  --  95.6  --   --   PLT 205 194 177  --  160  --  188  --  129*  --   --    < > = values in this interval not displayed.    Basic Metabolic Panel: Recent Labs  Lab 04/12/21 1155 04/13/21 0342 04/14/21 0246 04/15/21 0135 04/15/21 1500 04/15/21 1654 04/15/21 1703 04/15/21 1730 04/15/21 1758 04/15/21 1827 04/15/21 1932 04/15/21 1946 04/15/21 2050 04/16/21 0303 04/16/21 0836 04/16/21 0953  NA 138 136 135 136   < > 136   < > 136   < > 134* 136 137 140 136 139 138  K 3.8 3.5 3.3* 3.3*   < > 5.5*   < > 4.3   < > 4.4 3.7 3.8 3.5 4.5 4.4 4.4  CL 104 102 105 103   < > 100  --  100  --  101 104  --   --  106  --   --   CO2 25 25 21* 23  --   --   --   --   --   --   --   --   --  23  --   --   GLUCOSE 110* 101* 83 95   < > 114*  --  128*  --  126* 157*  --   --  136*  --   --   BUN 9 10 7* <5*   < > <3*  --  <3*  --  3* 4*  --   --  6*  --   --   CREATININE 1.18 0.86 0.71 0.74   < > 0.50*  --  0.50*  --  0.50* 0.60*  --   --  0.81  --   --   CALCIUM 8.9 8.8* 7.7* 8.7*  --   --   --   --   --   --   --   --   --  7.6*  --   --   MG  --   --   --   --   --   --   --   --   --   --   --   --   --  2.6*  --   --    < > = values in this interval not displayed.   GFR: Estimated Creatinine Clearance: 108.4 mL/min (by C-G formula based on SCr of 0.81 mg/dL). Recent Labs  Lab 04/14/21 0246  04/15/21 0135 04/15/21 2049 04/16/21 0303  WBC 7.9 5.9 14.3* 8.6    Liver Function Tests: No results for input(s): AST, ALT, ALKPHOS, BILITOT, PROT, ALBUMIN in the last 168 hours. No results for input(s): LIPASE, AMYLASE in the last 168 hours. No results for input(s): AMMONIA in the last 168 hours.  ABG    Component  Value Date/Time   PHART 7.419 04/16/2021 0953   PCO2ART 38.8 04/16/2021 0953   PO2ART 110 (H) 04/16/2021 0953   HCO3 25.0 04/16/2021 0953   TCO2 26 04/16/2021 0953   O2SAT 98.0 04/16/2021 0953     Coagulation Profile: Recent Labs  Lab 04/15/21 2049  INR 1.4*    Cardiac Enzymes: No results for input(s): CKTOTAL, CKMB, CKMBINDEX, TROPONINI in the last 168 hours.  HbA1C: Hgb A1c MFr Bld  Date/Time Value Ref Range Status  04/12/2021 11:55 AM 4.9 4.8 - 5.6 % Final    Comment:    (NOTE) Pre diabetes:          5.7%-6.4%  Diabetes:              >6.4%  Glycemic control for   <7.0% adults with diabetes   01/20/2016 04:15 PM 5.1 4.8 - 5.6 % Final    Comment:    (NOTE)         Pre-diabetes: 5.7 - 6.4         Diabetes: >6.4         Glycemic control for adults with diabetes: <7.0     CBG: Recent Labs  Lab 04/16/21 0507 04/16/21 0607 04/16/21 0702 04/16/21 0830 04/16/21 0947  GLUCAP 157* 134* 140* 120* 121*    Review of Systems:   Positives in bold  Gen: fever, chills, weight change, fatigue, night sweats, pain HEENT:  blurred vision, double vision, hearing loss, tinnitus, sinus congestion, rhinorrhea, sore throat, neck stiffness, dysphagia PULM:  shortness of breath, cough, sputum production, hemoptysis, wheezing CV: chest pain, edema, orthopnea, paroxysmal nocturnal dyspnea, palpitations GI:  abdominal pain, nausea, vomiting, diarrhea, hematochezia, melena, constipation, change in bowel habits GU: dysuria, hematuria, polyuria, oliguria, urethral discharge Endocrine: hot or cold intolerance, polyuria, polyphagia or appetite change Derm: rash, dry skin, scaling or peeling skin change Heme: easy bruising, bleeding, bleeding gums Neuro: headache, numbness, weakness, slurred speech, loss of memory or consciousness   Past Medical History:  He,  has Dwayne past medical history of Acute ST elevation myocardial infarction (STEMI) involving left anterior descending (LAD)  coronary artery ([place]), Adenomatous colon polyp (2000), Arthritis, Coronary artery disease involving native coronary artery of native heart without angina pectoris (03/23/2016), Diverticulosis, Erectile dysfunction, History of stroke (01/23/2017), Hypercholesteremia, Hypertension, Internal hemorrhoids, Kidney stones, Myocardial infarction [facility]), Occipital cerebral infarction ([place]) (03/23/2016), and Splenomegaly.   Surgical History:   Past Surgical History:  Procedure Laterality Date   AORTIC VALVE REPLACEMENT N/Dwayne 04/15/2021   Procedure: AORTIC VALVE REPLACEMENT (AVR) USING 27 MM INSPIRIS RESILIA  AORTIC VALVE;  Surgeon: Lajuana Matte, MD;  Location: [place];  Service: Open Heart Surgery;  Laterality: N/Dwayne;   CARDIAC CATHETERIZATION N/Dwayne 01/20/2016   Procedure: Left Heart Cath and Coronary Angiography;  Surgeon: Burnell Blanks, MD; LAD 100%, D1 100%, multi-vessel dz 10-30% in other vessels, EF 25-35%, elevated LVEDP   CARDIAC CATHETERIZATION N/Dwayne 01/20/2016   Procedure: Coronary Stent Intervention;  Surgeon: Burnell Blanks, MD; PROMUS PREM MR 3.5X16 mm DES LAD, D1 jailed by the stent but was already occluded   CHOLECYSTECTOMY  2009   COLONOSCOPY  CORONARY ARTERY BYPASS GRAFT N/Dwayne 04/15/2021   Procedure: CORONARY ARTERY BYPASS GRAFTING (CABG) x 3 ON CARDIOPULMONARY BYPASS;  Surgeon: Lajuana Matte, MD;  Location: [place];  Service: Open Heart Surgery;  Laterality: N/Dwayne;   Flow trac   ENDOVEIN HARVEST OF GREATER SAPHENOUS VEIN Right 04/15/2021   Procedure: ENDOVEIN HARVEST OF GREATER SAPHENOUS VEIN;  Surgeon: Lajuana Matte, MD;  Location: [place];  Service: Open Heart Surgery;  Laterality: Right;   EYE SURGERY     Age 26   LEFT HEART CATH AND CORONARY ANGIOGRAPHY N/Dwayne 04/13/2021   Procedure: LEFT HEART CATH AND CORONARY ANGIOGRAPHY;  Surgeon: Troy Sine, MD;  Location: [place] [facility];  Service: Cardiovascular;  Laterality: N/Dwayne;   [place] SURGERY  2008   SHOULDER SURGERY  1998   Left shoulder fracture-clavicle   WRIST FRACTURE SURGERY  1995   Bilateral     Social History:   reports that he has never smoked. He has never used smokeless tobacco. He reports current alcohol use. He reports that he does not use drugs.   Family History:  His family history includes CVA in his brother, brother, brother, and father; Heart disease in his father. There is no history of Colon polyps, Colon cancer, Esophageal cancer, Stomach cancer, Pancreatic cancer, or Liver disease.   Allergies Allergies  Allergen Reactions   Codeine Nausea Only   Lipitor [Atorvastatin] Other (See Comments)    Myalgia     Home Medications  Prior to Admission medications   Medication Sig Start Date End Date Taking? Authorizing Provider  AMBULATORY NON FORMULARY MEDICATION Apply 1 application topically daily as needed (pain). Medication Name: CBD Oil - daily   Yes [provider]  aspirin EC 81 MG tablet Take by mouth. 03/17/21  Yes [provider]  carvedilol (COREG) 25 MG tablet TAKE 1 TABLET BY MOUTH 2 (TWO) TIMES DAILY. PLEASE KEEP UPCOMING APPT IN [facility] 2022 07/28/20  Yes Nahser, Wonda Cheng, MD  lisinopril (ZESTRIL) 5 MG tablet TAKE 1 TABLET BY MOUTH EVERY DAY 07/28/20  Yes Nahser, Wonda Cheng, MD  Multiple Vitamin (MULTIVITAMIN WITH MINERALS) TABS tablet Take 1 tablet by mouth daily.   Yes [provider]  nitroGLYCERIN (NITROSTAT) 0.4 MG SL tablet Place 1 tablet (0.4 mg total) under the tongue every 5 (five) minutes as needed for chest pain. 01/22/16  Yes Barrett, Evelene Croon, PA-C  ondansetron (ZOFRAN) 4 MG tablet Take 4 mg by mouth every 8 (eight) hours as needed for nausea/vomiting, nausea or vomiting. 03/17/21  Yes [provider]  oxyCODONE (OXY IR/ROXICODONE) 5 MG immediate release tablet Take 5 mg by mouth every 6 (six) hours as needed for pain or moderate pain. 03/17/21  Yes [provider]  rosuvastatin  (CRESTOR) 40 MG tablet Take 40 mg by mouth daily.   Yes [provider]  terbinafine (LAMISIL) 250 MG tablet Take 1 tablet (250 mg total) by mouth daily. Patient taking differently: Take 250 mg by mouth every evening. 01/26/21  Yes Hyatt, Max T, DPM  tiZANidine (ZANAFLEX) 2 MG tablet Take 4 mg by mouth every 8 (eight) hours as needed for muscle spasms. 03/17/21  Yes [provider]  aspirin 81 MG chewable tablet Chew 1 tablet (81 mg total) by mouth daily. Patient not taking: No sig reported 01/22/16   Barrett, Evelene Croon, PA-C     Critical care time: n/Dwayne    [employer]mond [employer]., MSN, APRN,  AGACNP-BC [facility]  04/16/2021 , 10:57 AM  Please see [email or web address] for pager details  If no response, please call [phone] After hours, please call Elink at [phone]

## 2021-04-16 NOTE — Progress Notes (Addendum)
[address]       [place] [address]             [phone]      1 Day Post-Op  Procedure(s) (LRB): CORONARY ARTERY BYPASS GRAFTING (CABG) x 3 ON CARDIOPULMONARY BYPASS (N/A) AORTIC VALVE REPLACEMENT (AVR) USING 27 MM INSPIRIS RESILIA  AORTIC VALVE (N/A) ENDOVEIN HARVEST OF GREATER SAPHENOUS VEIN (Right) APPLICATION OF CELL SAVER (N/A)   Total Length of Stay:  LOS: 3 days    SUBJECTIVE:  Vitals:   04/16/21 1700 04/16/21 1730  BP: 110/63 111/72  Pulse: 95 93  Resp: (!) 28 (!) 23  Temp: (!) 100.9 F (38.3 C) (!) 100.9 F (38.3 C)  SpO2: 95% 94%    Intake/Output      11/17 0701 11/18 0700 11/18 0701 11/19 0700   P.O.  240   I.V. (mL/kg) 3049 (28.5) 545.3 (5.1)   Blood 300    NG/GT 50    IV Piggyback 1100.8 100.1   Total Intake(mL/kg) 4499.8 (42.1) 885.3 (8.3)   Urine (mL/kg/hr) 3630 (1.4) 320 (0.3)   Emesis/NG output 50    Stool     Blood 600    Chest Tube 200 70   Total Output 4480 390   Net +19.8 +495.3            sodium chloride 20 mL/hr at 04/16/21 1700   sodium chloride     sodium chloride      ceFAZolin (ANCEF) IV Stopped (04/16/21 1214)   dexmedetomidine (PRECEDEX) IV infusion Stopped (04/16/21 0520)   lactated ringers     lactated ringers 20 mL/hr at 04/16/21 1700   niCARDipine Stopped (04/15/21 2237)   nitroGLYCERIN Stopped (04/15/21 2236)   phenylephrine (NEO-SYNEPHRINE) Adult infusion Stopped (04/16/21 1547)    CBC    Component Value Date/Time   WBC 10.4 04/16/2021 1621   RBC 2.73 (L) 04/16/2021 1621   HGB 9.2 (L) 04/16/2021 1621   HCT 26.4 (L) 04/16/2021 1621   PLT 131 (L) 04/16/2021 1621   MCV 96.7 04/16/2021 1621   MCH 33.7 04/16/2021 1621   MCHC 34.8 04/16/2021 1621   RDW 13.1 04/16/2021 1621   CMP     Component Value Date/Time   NA 136 04/16/2021 1621   NA 140 04/04/2018 0838   K 4.1 04/16/2021 1621   CL 106 04/16/2021 1621   CO2 22 04/16/2021 1621   GLUCOSE 122 (H) 04/16/2021 1621   BUN 7  (L) 04/16/2021 1621   BUN 8 04/04/2018 0838   CREATININE 0.89 04/16/2021 1621   CREATININE 0.84 01/26/2021 0841   CALCIUM 7.7 (L) 04/16/2021 1621   PROT 6.6 01/26/2021 0841   PROT 6.7 04/04/2018 0838   ALBUMIN 4.3 04/04/2018 0838   AST 15 01/26/2021 0841   ALT 23 01/26/2021 0841   ALKPHOS 70 04/04/2018 0838   BILITOT 0.5 01/26/2021 0841   BILITOT 0.5 04/04/2018 0838   GFRNONAA >60 04/16/2021 1621   GFRAA 105 04/04/2018 0838   ABG    Component Value Date/Time   PHART 7.419 04/16/2021 0953   PCO2ART 38.8 04/16/2021 0953   PO2ART 110 (H) 04/16/2021 0953   HCO3 25.0 04/16/2021 0953   TCO2 26 04/16/2021 0953   O2SAT 98.0 04/16/2021 0953   CBG (last 3)  Recent Labs    04/16/21 1240 04/16/21 1436 04/16/21 1545  GLUCAP 135* 121* 137*    ASSESSMENT:  Patient with pain  Extubated this morning  Off all drips.  CT output has been 100 cc this shift  Continue current care  Ellwood Handler, PA-C 6:19 PM  Patient seen and examined, agree with above   Remo Lipps C. Roxan Hockey, MD [facility] [phone]

## 2021-04-16 NOTE — Progress Notes (Signed)
[address]       [place] [address]             [phone]                 1 Day Post-Op Procedure(s) (LRB): CORONARY ARTERY BYPASS GRAFTING (CABG) x 3 ON CARDIOPULMONARY BYPASS (N/A) AORTIC VALVE REPLACEMENT (AVR) USING 27 MM INSPIRIS RESILIA  AORTIC VALVE (N/A) ENDOVEIN HARVEST OF GREATER SAPHENOUS VEIN (Right) APPLICATION OF CELL SAVER (N/A)   Events: No events _______________________________________________________________ Vitals: BP 95/63   Pulse (!) 57   Temp 99 F (37.2 C)   Resp 13   Ht 5\' 10"  (1.778 m)   Wt 107 kg   SpO2 100%   BMI 33.85 kg/m  Filed Weights   04/14/21 0402 04/15/21 0634 04/16/21 0500  Weight: 104.4 kg 101.6 kg 107 kg     - Neuro: sedated  - Cardiovascular: sinus  Drips: neo 40.      - Pulm:  Vent Mode: SIMV;PRVC FiO2 (%):  [50 %] 50 % Set Rate:  [12 bmp] 12 bmp Vt Set:  [580 mL] 580 mL PEEP:  [5 cmH20] 5 cmH20 Pressure Support:  [10 cmH20] 10 cmH20 Plateau Pressure:  [8 cmH20-19 cmH20] 8 cmH20  ABG    Component Value Date/Time   PHART 7.536 (H) 04/15/2021 1946   PCO2ART 28.8 (L) 04/15/2021 1946   PO2ART 175 (H) 04/15/2021 1946   HCO3 24.4 04/15/2021 1946   TCO2 25 04/15/2021 1946   O2SAT 100.0 04/15/2021 1946    - Abd: ND - Extremity: warm  .Intake/Output      11/17 0701 11/18 0700 11/18 0701 11/19 0700   P.O.     I.V. (mL/kg) 3049 (28.5)    Blood 300    NG/GT 50    IV Piggyback 1100.8    Total Intake(mL/kg) 4499.8 (42.1)    Urine (mL/kg/hr) 3630 (1.4)    Emesis/NG output 50    Stool     Blood 600    Chest Tube 200    Total Output 4480    Net +19.8            _______________________________________________________________ Labs: CBC Latest Ref Rng & Units 04/16/2021 04/15/2021 04/15/2021  WBC 4.0 - 10.5 K/uL 8.6 14.3(H) -  Hemoglobin 13.0 - 17.0 g/dL 9.2(L) 11.2(L) 5.8(LL)  Hematocrit 39.0 - 52.0 % 25.8(L) 31.1(L) 17.0(L)  Platelets 150 - 400 K/uL 129(L) 188 -   CMP Latest  Ref Rng & Units 04/16/2021 04/15/2021 04/15/2021  Glucose 70 - 99 mg/dL 136(H) - 157(H)  BUN 8 - 23 mg/dL 6(L) - 4(L)  Creatinine 0.61 - 1.24 mg/dL 0.81 - 0.60(L)  Sodium 135 - 145 mmol/L 136 137 136  Potassium 3.5 - 5.1 mmol/L 4.5 3.8 3.7  Chloride 98 - 111 mmol/L 106 - 104  CO2 22 - 32 mmol/L 23 - -  Calcium 8.9 - 10.3 mg/dL 7.6(L) - -  Total Protein 6.1 - 8.1 g/dL - - -  Total Bilirubin 0.2 - 1.2 mg/dL - - -  Alkaline Phos 39 - 117 IU/L - - -  AST 10 - 35 U/L - - -  ALT 9 - 46 U/L - - -    CXR: PV congestion  _______________________________________________________________  Assessment and Plan: POD 1 s/p bAVR/CABG3.  Doing well  Neuro: sedation off CV: sinus in 60s.  Does not like V pacing.  Wean neo.  Will start A/S today.  Will start plavix  once wires out Pulm: wean to extubate.  Will keep CT until wires ourt Renal: creat stable. GI: NPO for now.  Will start diet once extubated Heme: stable ID: Afebrile Endo: SSI today Dispo: continue ICU care   Lajuana Matte 04/16/2021 7:15 AM

## 2021-04-16 NOTE — Discharge Instructions (Signed)

## 2021-04-16 NOTE — Progress Notes (Signed)
Progress Note  Patient Name: Dwayne [name] Date of Encounter: 04/16/2021  [facility] Cardiologist: Mertie Moores, MD   Subjective   Extubated in ICU. Awake and alert this morning. Uncomfortable at chest tube sites. Remains on phenylephrine  Underwent CABG with LIMA-LAD, SVG-D1, SVG-OM and AVR with inspiris resilia 43mm bioprosthetic valve with Dr. Kipp Brood  Inpatient Medications    Scheduled Meds:  acetaminophen  1,000 mg Oral Q6H   Or   acetaminophen (TYLENOL) oral liquid 160 mg/5 mL  1,000 mg Per Tube Q6H   aspirin EC  325 mg Oral Daily   Or   aspirin  324 mg Per Tube Daily   bisacodyl  10 mg Oral Daily   Or   bisacodyl  10 mg Rectal Daily   Chlorhexidine Gluconate Cloth  6 each Topical Daily   docusate sodium  200 mg Oral Daily   metoprolol tartrate  12.5 mg Oral BID   Or   metoprolol tartrate  12.5 mg Per Tube BID   [START ON 04/17/2021] pantoprazole  40 mg Oral Daily   rosuvastatin  40 mg Oral Daily   sodium chloride flush  10-40 mL Intracatheter Q12H   sodium chloride flush  3 mL Intravenous Q12H   [START ON 04/17/2021] terbinafine  250 mg Oral QPM   Continuous Infusions:  sodium chloride 20 mL/hr at 04/16/21 0500   sodium chloride     sodium chloride     albumin human 12.5 g (04/16/21 0005)    ceFAZolin (ANCEF) IV Stopped (04/16/21 0336)   dexmedetomidine (PRECEDEX) IV infusion 0.7 mcg/kg/hr (04/16/21 0500)   famotidine (PEPCID) IV Stopped (04/15/21 2304)   insulin 1 Units/hr (04/16/21 0500)   lactated ringers     lactated ringers 20 mL/hr at 04/16/21 0500   niCARDipine Stopped (04/15/21 2237)   nitroGLYCERIN Stopped (04/15/21 2236)   phenylephrine (NEO-SYNEPHRINE) Adult infusion 20 mcg/min (04/16/21 0500)   PRN Meds: sodium chloride, albumin human, dextrose, metoprolol tartrate, midazolam, morphine injection, ondansetron (ZOFRAN) IV, oxyCODONE, sodium chloride flush, sodium chloride flush, traMADol   Vital Signs    Vitals:   04/16/21 0445  04/16/21 0500 04/16/21 0515 04/16/21 0530  BP:  109/72    Pulse: 63 62 62 60  Resp: 14 15 16 16   Temp: 98.2 F (36.8 C) 98.2 F (36.8 C) 98.4 F (36.9 C) 98.4 F (36.9 C)  TempSrc:      SpO2: 100% 100% 100% 100%  Weight:  107 kg    Height:        Intake/Output Summary (Last 24 hours) at 04/16/2021 0555 Last data filed at 04/16/2021 0500 Gross per 24 hour  Intake 4375.84 ml  Output 4400 ml  Net -24.16 ml    Last 3 Weights 04/16/2021 04/15/2021 04/14/2021  Weight (lbs) 235 lb 14.3 oz 224 lb 230 lb 1.6 oz  Weight (kg) 107 kg 101.606 kg 104.373 kg      Telemetry    NSR, occasional PVCs - Personally Reviewed  ECG    Sinus brady, nonspecific t wave flattening- Personally Reviewed  Physical Exam   GEN: Awake and alter. Sitting in bed Neck: IJ in place Cardiac: RR, 2/6 systolic murmur at RUSB Respiratory: Diminished at bases GI: Soft, nontender, non-distended  MS: Warm, right leg harvest site in dressing Neuro:  Nonfocal  Psych: Normal affect   Labs    High Sensitivity Troponin:   Recent Labs  Lab 04/12/21 1155 04/12/21 1404  TROPONINIHS 10 9      Chemistry Recent  Labs  Lab 04/14/21 0246 04/15/21 0135 04/15/21 1500 04/15/21 1827 04/15/21 1932 04/15/21 1946 04/16/21 0303  NA 135 136   < > 134* 136 137 136  K 3.3* 3.3*   < > 4.4 3.7 3.8 4.5  CL 105 103   < > 101 104  --  106  CO2 21* 23  --   --   --   --  23  GLUCOSE 83 95   < > 126* 157*  --  136*  BUN 7* <5*   < > 3* 4*  --  6*  CREATININE 0.71 0.74   < > 0.50* 0.60*  --  0.81  CALCIUM 7.7* 8.7*  --   --   --   --  7.6*  MG  --   --   --   --   --   --  2.6*  GFRNONAA >60 >60  --   --   --   --  >60  ANIONGAP 9 10  --   --   --   --  7   < > = values in this interval not displayed.     Lipids  Recent Labs  Lab 04/13/21 0342  CHOL 109  TRIG 119  HDL 32*  LDLCALC 53  CHOLHDL 3.4     Hematology Recent Labs  Lab 04/15/21 0135 04/15/21 1500 04/15/21 1821 04/15/21 1827  04/15/21 1946 04/15/21 2049 04/16/21 0303  WBC 5.9  --   --   --   --  14.3* 8.6  RBC 3.35*  --   --   --   --  3.29* 2.70*  HGB 11.2*   < > 8.5*   < > 5.8* 11.2* 9.2*  HCT 32.0*   < > 24.5*   < > 17.0* 31.1* 25.8*  MCV 95.5  --   --   --   --  94.5 95.6  MCH 33.4  --   --   --   --  34.0 34.1*  MCHC 35.0  --   --   --   --  36.0 35.7  RDW 12.2  --   --   --   --  12.3 12.8  PLT 177  --  160  --   --  188 129*   < > = values in this interval not displayed.    Thyroid No results for input(s): TSH, FREET4 in the last 168 hours.  BNPNo results for input(s): BNP, PROBNP in the last 168 hours.  DDimer No results for input(s): DDIMER in the last 168 hours.   Radiology    DG Chest Port 1 View  Result Date: 04/15/2021 CLINICAL DATA:  Pneumothorax.  Postop EXAM: PORTABLE CHEST 1 VIEW COMPARISON:  04/12/2021 FINDINGS: Changes of CABG and valve replacement. Endotracheal tube is 3 cm above the carina. Right central line in place with the tip in the SVC. NG tube is in the stomach. No pneumothorax. Left base atelectasis. No effusions. Heart is normal size. IMPRESSION: Changes of CABG and valve replacement.  No pneumothorax. Left base atelectasis. Electronically Signed   By: Rolm Baptise M.D.   On: 04/15/2021 21:03   VAS US DOPPLER PRE CABG  Result Date: 04/15/2021 PREOPERATIVE VASCULAR EVALUATION Patient Name:  Dwayne [name] [facility]  Date of Exam:   04/15/2021 Medical Rec #: [id number]         Accession #:    [id number] Date of Birth: [birth year]  Patient Gender: M Patient Age:   15 years Exam Location:  [facility] Procedure:      VAS US DOPPLER PRE CABG Referring Phys: HARRELL LIGHTFOOT --------------------------------------------------------------------------------  Indications:      Pre-CABG. Risk Factors:     Hypertension, hyperlipidemia, coronary artery disease, prior                   CVA. Other Factors:    MI 2007 with PCI. Ischemic cardiomyopathy. Comparison Study: Prior normal  carotid duplex done 2017 Performing Technologist: Sharion Dove RVS  Examination Guidelines: A complete evaluation includes B-mode imaging, spectral Doppler, color Doppler, and power Doppler as needed of all accessible portions of each vessel. Bilateral testing is considered an integral part of a complete examination. Limited examinations for reoccurring indications may be performed as noted.  Right Carotid Findings: +----------+--------+--------+--------+--------+------------------+           PSV cm/sEDV cm/sStenosisDescribeComments           +----------+--------+--------+--------+--------+------------------+ CCA Prox  64      18                                         +----------+--------+--------+--------+--------+------------------+ CCA Distal61      19                      intimal thickening +----------+--------+--------+--------+--------+------------------+ ICA Prox  55      19                                         +----------+--------+--------+--------+--------+------------------+ ICA Distal73      30                      tortuous           +----------+--------+--------+--------+--------+------------------+ ECA       105     12                                         +----------+--------+--------+--------+--------+------------------+ +----------+--------+-------+--------+------------+           PSV cm/sEDV cmsDescribeArm Pressure +----------+--------+-------+--------+------------+ [id number]                                  +----------+--------+-------+--------+------------+ +---------+--------+--+--------+--+ VertebralPSV cm/s43EDV cm/s12 +---------+--------+--+--------+--+ Left Carotid Findings: +----------+--------+--------+--------+--------+------------------+           PSV cm/sEDV cm/sStenosisDescribeComments           +----------+--------+--------+--------+--------+------------------+ CCA Prox  90      16                                          +----------+--------+--------+--------+--------+------------------+ CCA Distal47      13                      intimal thickening +----------+--------+--------+--------+--------+------------------+ ICA Prox  65      22                                         +----------+--------+--------+--------+--------+------------------+  ICA Distal65      25                      tortuous           +----------+--------+--------+--------+--------+------------------+ ECA       127     12                                         +----------+--------+--------+--------+--------+------------------+ +----------+--------+--------+--------+------------+ SubclavianPSV cm/sEDV cm/sDescribeArm Pressure +----------+--------+--------+--------+------------+           57                                   +----------+--------+--------+--------+------------+ +---------+--------+--+--------+--+ VertebralPSV cm/s40EDV cm/s14 +---------+--------+--+--------+--+  ABI Findings: +--------+------------------+-----+---------+--------+ Right   Rt Pressure (mmHg)IndexWaveform Comment  +--------+------------------+-----+---------+--------+ Brachial                       triphasic         +--------+------------------+-----+---------+--------+ PTA                            triphasic         +--------+------------------+-----+---------+--------+ DP                             triphasic         +--------+------------------+-----+---------+--------+ +--------+------------------+-----+---------+-------+ Left    Lt Pressure (mmHg)IndexWaveform Comment +--------+------------------+-----+---------+-------+ Brachial                       triphasic        +--------+------------------+-----+---------+-------+ PTA                            triphasic        +--------+------------------+-----+---------+-------+ DP                             triphasic         +--------+------------------+-----+---------+-------+  Right Doppler Findings: +--------+--------+-----+---------+--------+ Site    PressureIndexDoppler  Comments +--------+--------+-----+---------+--------+ Brachial             triphasic         +--------+--------+-----+---------+--------+ Radial               triphasic         +--------+--------+-----+---------+--------+ Ulnar                triphasic         +--------+--------+-----+---------+--------+  Left Doppler Findings: +--------+--------+-----+---------+--------+ Site    PressureIndexDoppler  Comments +--------+--------+-----+---------+--------+ Brachial             triphasic         +--------+--------+-----+---------+--------+ Radial               triphasic         +--------+--------+-----+---------+--------+ Ulnar                triphasic         +--------+--------+-----+---------+--------+  Summary: Right Carotid: The extracranial vessels were near-normal with only minimal wall                thickening or plaque. Left Carotid:  The extracranial vessels were near-normal with only minimal wall               thickening or plaque. Vertebrals:  Bilateral vertebral arteries demonstrate antegrade flow. Subclavians: Normal flow hemodynamics were seen in bilateral subclavian              arteries. Right Upper Extremity: Doppler waveforms remain within normal limits with right radial compression. Doppler waveform obliterate with right ulnar compression. Left Upper Extremity: Doppler waveforms decrease <50% w left radial compression. Doppler waveforms remain within normal limits with left ulnar compression.  Electronically signed by Orlie Pollen on 04/15/2021 at 7:02:06 PM.    Final     Cardiac Studies  Cath 04/13/21:   Colon Flattery LM to Dist LM lesion is 80% stenosed.   Prox LAD to Mid LAD lesion is 5% stenosed.   Prox LAD lesion is 30% stenosed.   1st Diag lesion is 90% stenosed.   Ost Cx to Prox Cx lesion is 5%  stenosed.   2nd Mrg lesion is 15% stenosed.   Prox RCA lesion is 10% stenosed.   Since the 2017 acute catheterization there is now severe 80% eccentric left main stenosis with calcification superiorly.   The proximal LAD stent is patent and the previously occluded jailed diagonal vessel is now a diminutive vessel.  There is 30% stenosis proximal to the stent and mild smooth 20% narrowing beyond the stented segment.    Non-obstructive mild circumflex and RCA stenosis with a dominant RCA and septal collateralization to the LAD.   EF by echo today was 45%.  LVEDP is increased at 28 mm.   RECOMMENDATION: Will heparinize 8 hours post TR band removal.  Will initiate nitrates and continue carvedilol.  Surgical consultation for CABG revascularization   TTE 04/13/21: IMPRESSIONS     1. Left ventricular ejection fraction, by estimation, is 45%. Left  ventricular ejection fraction by 2D MOD biplane is 45.2 %. The left  ventricle has mildly decreased function. The left ventricle demonstrates  regional wall motion abnormalities (Apical  hypokinesis without LV thrombus). Left ventricular diastolic parameters  are indeterminate.   2. Right ventricular systolic function is normal. The right ventricular  size is normal. Tricuspid regurgitation signal is inadequate for assessing  PA pressure.   3. The mitral valve is grossly normal. Trivial mitral valve  regurgitation.   4. The aortic valve is tricuspid. Aortic valve regurgitation is mild to  moderate. No aortic stenosis is present.   5. Aortic dilatation noted. There is mild dilatation of the aortic root,  measuring 43 mm.   6. The inferior vena cava is normal in size with greater than 50%  respiratory variability, suggesting right atrial pressure of 3 mmHg.   Transthoracic Echocardiogram: Date: 04/20/2016 Results: Study Conclusions   - Left ventricle: The cavity size was normal. Wall thickness was    normal. Systolic function was mildly  to moderately reduced. The    estimated ejection fraction was in the range of 40% to 45%.    Diffuse hypokinesis. Doppler parameters are consistent with    abnormal left ventricular relaxation (grade 1 diastolic    dysfunction).  - Aortic valve: Trileaflet; mildly thickened, mildly calcified    leaflets. There was trivial regurgitation.  - Mitral valve: There was mild regurgitation.    Carotid Vascular Duplex: Date: 01/23/2016 Results: Bilateral: mild mixed plaque origin ICA. Mild soft plaque proximal  ICA and ECA. Vertebral artery flow is antegrade. 1-39% ICA  plaquing. Left ECA velocities  are elevated.    Left/Right Heart Catheterizations: Date: 01/20/2016 Results: Mid RCA lesion, 10 %stenosed. Ost 1st Mrg to 1st Mrg lesion, 20 %stenosed. 2nd Mrg lesion, 20 %stenosed. Mid Cx lesion, 10 %stenosed. Prox Cx to Mid Cx lesion, 10 %stenosed. Ost LM to LM lesion, 30 %stenosed. Ost 1st Diag to 1st Diag lesion, 100 %stenosed. A STENT PROMUS PREM MR 3.5X16 drug eluting stent was successfully placed. Prox LAD lesion, 100 %stenosed. Post intervention, there is a 0% residual stenosis. Mid LAD lesion, 20 %stenosed. There is moderate left ventricular systolic dysfunction. LV end diastolic pressure is mildly elevated. The left ventricular ejection fraction is 25-35% by visual estimate. There is no mitral valve regurgitation.   1. Acute anterior STEMI secondary to occluded proximal LAD 2. Successful PTCA/DES x 1 proximal to mid LAD 3. Mild disease in the left main, RCA and Circumflex 4. Moderate segmental LV systolic dysfunction   Recommendations: Will continue DAPT for one year with ASA and Brilinta. Will start beta blocker and change statin to Crestor. Will continue Ace-inh. Echo in before discharge.   Patient Profile     65 y.o. male with history of LAD STEMI s/p PCI in 2017, ischemic cardiomyopathy with LVEF 25-30% which improved to 40-45%, prior CVA with mild carotid disease, HTN and  HLD who presented with chest pain with concern for UA. Cath revealed 80% distal LM now s/p CABG (LIMA-LAD, SVG-OM, SVG-D1)+AVR on 04/15/21  Assessment & Plan    #Unstable Angina: #Left Main Disease: #History of STEMI s/p LAD PCI: Patient presents with exertional substernal chest pressure that has been increasing in frequency over the past month in the setting of known CAD. Specifically, patient suffered LAD STEMI in 2017 s/p PCI with mild residual disease in LM, RCA, Lcx. Underwent cath on 04/13/21 which revealed interval progression of LM disease to 80%.  EF 45%.Now s/p CABG with LIMA-LAD, SVG-D1, SVG to OM and AVR on 04/15/21. -S/p CABG +AVR on 04/15/21 -Post-op care per CV surgery -Continue ASA 324mg  daily  per surgery -Continue metop 12.5mg  BID -Continue crestor 40mg  daily -Will add ACE/ARB as able    #Ischemic CM with LVEF 40-45%: Patient with history of ischemic CM with initial EF 25-30% which improved to 40-45% on TTE in 2017. EF remained consistent on most recent TTE 04/13/21 at 45%. Compensated on exam. -Continue metop BID; can transition back to home coreg once able  -Will add ACE/ARB as able -Will add spiro/SGLT2i as tolerated  #Mild-to-Moderate AR s/p AVR: S/p AVR with inspiris resilia 18mm bioprosthetic valve 04/15/21. -Continue ASA 324mg  daily -Will need dental ppx   #HLD: LDL well controlled at 53, TG 119, HDL 32 -Continue home crestor 40mg  daily   #HTN: -Continue metop as above; transition to home coreg once able  #Aortic Aneurysm: Root measures 44mm. -Will need serial monitoring as out-patient  For questions or updates, please contact [place] Please consult [email or web address] for contact info under        Signed, Freada Bergeron, MD  04/16/2021, 5:55 AM

## 2021-04-16 NOTE — Discharge Summary (Addendum)
[name]       [place] [address]             [phone]    Physician Discharge Summary  Patient ID: Dwayne [name] MRN: [id number] DOB/AGE: [birth year] 65 y.o.  Admit date: 04/12/2021 Discharge date: 04/20/2021  Admission Diagnoses:  Patient Active Problem List   Diagnosis Date Noted   Coronary artery disease 04/15/2021   Unstable angina ([place]) 04/12/2021   Ischemic cardiomyopathy 03/17/2021   Diverticulosis of colon 11/19/2020   Kidney stone 11/19/2020   Stable angina ([place]) 11/19/2020   Internal hemorrhoids    Erectile dysfunction    History of stroke 01/23/2017   Occipital cerebral infarction [facility]) 03/23/2016   Coronary artery disease involving native coronary artery of native heart without angina pectoris 03/23/2016   Antiplatelet or antithrombotic long-term use 02/02/2016   Syncope 01/23/2016   Chronic combined systolic and diastolic heart failure ([place]) 01/22/2016   Dizziness 01/22/2016   Hypertension    Hypercholesteremia    Near syncope    Acute ST elevation myocardial infarction (STEMI) involving left anterior descending (LAD) coronary artery (HCC)    Hx of adenomatous colonic polyps 04/14/2014   Discharge Diagnoses:  Patient Active Problem List   Diagnosis Date Noted   Chest pain    S/P CABG x 3 04/15/2021   S/P aortic valve replacement with bioprosthetic valve 04/15/2021   Coronary artery disease 04/15/2021   Unstable angina ([place]) 04/12/2021   Ischemic cardiomyopathy 03/17/2021   Diverticulosis of colon 11/19/2020   Kidney stone 11/19/2020   Stable angina ([place]) 11/19/2020   Internal hemorrhoids    Erectile dysfunction    History of stroke 01/23/2017   Occipital cerebral infarction ([place]) 03/23/2016   Coronary artery disease involving native coronary artery of native heart without angina pectoris 03/23/2016   Antiplatelet or antithrombotic long-term use 02/02/2016   Syncope 01/23/2016   Chronic combined systolic and  diastolic heart failure ([place]) 01/22/2016   Dizziness 01/22/2016   Hypertension    Hypercholesteremia    Near syncope    Acute ST elevation myocardial infarction (STEMI) involving left anterior descending (LAD) coronary artery (HCC)    Hx of adenomatous colonic polyps 04/14/2014   Discharged Condition: stable  HPI: Patient is a 65 year old male with a well-known history of coronary artery disease having had a previous STEMI in 2017 involving the LAD with known ischemic cardiomyopathy.  Initial ejection fraction was in the 25 to 30% but it did improve to 40 to 45% following PCI.  He presented 2 days ago to the emergency department with chest pain.  He was last seen by cardiology on 03/17/2021 where it was felt that he was doing well and was compensated from a heart failure perspective with no significant ischemic symptoms.  The patient did report an episode of chest pain approximately 1 month ago described as a substernal chest pressure while packing his car from [place].  It resolved with rest and sublingual nitroglycerin after approximately 10 minutes.  Since that time he reports intermittent substernal chest tightness that resolves with rest.  None of the episodes were felt to be severe in nature however on the date of presentation while taking the trash out the episode was felt to be much more significant in terms of the chest tightness.  He took 1 sublingual nitroglycerin and showed improvement but decided to come to the ER for further evaluation.  Initial EKG showed sinus rhythm with no ischemic changes.  Initial  high-sensitivity troponin was 10 and subsequent was 9.  As the patient is known to have residual disease in left main, RCA and circumflex following his previous PCI of the LAD and exertional symptoms it was felt that he should undergo cardiac catheterization.  Additional cardiac risk factors include erectile dysfunction, history of CVA, hypercholesterolemia as well as hypertension.   Echocardiogram showed the LVEF to be 45%.  Please see full report listed below.  Cardiac catheterization revealed significant left main and multivessel coronary artery disease.  Please see the full report listed below.  We are asked to see the patient in cardiothoracic surgical consultation for consideration of revascularization. Dr. Kipp Brood discussed the need for coronary artery bypass grafting surgery. Potential risks, benefits, and complications of the surgery were discussed with the patient and he agreed to proceed with surgeon. Pre operative carotid duplex US showed no significant internal carotid artery stenosis bilaterally.  Hospital Course: Patient underwent a CABG x 3 and aortic valve replacement on 04/15/2021. He was transferred from the OR to [facility] ICU in stable condition.  He was extubated on POD #1.  He underwent debridement of chest tubes and arterial lines.  Pain control was an issue and he was started on prn Toradol with some relief.  He had persistent nausea and reglan was added. The patient's nausea improved.  He was maintaining NSR with bigeminy at times and was felt stable for transfer the progressive care unit on 04/18/2021.  His nausea did resolve on 11/21. He has been tolerating a diet and had a bowel movement. Lopressor was increased to 25 mg bid for better control of HR and BP. He is ambulating on room air. His wounds are clean, dry, and healing without signs of infection. Epicardial pacing wires were removed on 11/21. Patient's heart rate went up into the 130's with walking 11/21 around 10 am. Lopressor had been increased to 37.5 mg bid. Of note, patient refused to be put on tele most of 11/21 as the beeping kept him awake and he wanted to sleep. Will consider restarting Lisinopril after discharge as BP allows. Chest tube sutures were removed on 11/22. He is surgically stable for discharge today.  Consults: None  Significant Diagnostic Studies: angiography:       Ost LM to Dist LM  lesion is 80% stenosed.   Prox LAD to Mid LAD lesion is 5% stenosed.   Prox LAD lesion is 30% stenosed.   1st Diag lesion is 90% stenosed.   Ost Cx to Prox Cx lesion is 5% stenosed.   2nd Mrg lesion is 15% stenosed.   Prox RCA lesion is 10% stenosed.   Since the 2017 acute catheterization there is now severe 80% eccentric left main stenosis with calcification superiorly.   The proximal LAD stent is patent and the previously occluded jailed diagonal vessel is now a diminutive vessel.  There is 30% stenosis proximal to the stent and mild smooth 20% narrowing beyond the stented segment.    Non-obstructive mild circumflex and RCA stenosis with a dominant RCA and septal collateralization to the LAD.   EF by echo today was 45%.  LVEDP is increased at 28 mm.   Treatments: surgery:   04/15/2021 Patient:  [name] Pre-Op Dx: Left main coronary artery disease                         Unstable angina  History of previous PCI                         Moderate aortic regurgitation Post-op Dx: Same Procedure: CABG X 3.  LIMA to LAD, reverse saphenous vein graft to obtuse marginal, reverse saphenous vein graft to first diagonal. Endoscopic greater saphenous vein harvest on the right Aortic valve replacement 27 mm Inspiris Resilia valve   Surgeon and Role:      * Lightfoot, Lucile Crater, MD - Primary    *D. Tacy Dura, PA-C-provided assistance for vein harvesting as well as valve placement.   Discharge Exam: Blood pressure 139/85, pulse 89, temperature (!) 97.5 F (36.4 C), temperature source Oral, resp. rate 19, height 5\' 10"  (1.778 m), weight 102.2 kg, SpO2 100 %. Cardiovascular: RRR, rub Pulmonary: Clear to auscultation on right and slightly diminished left basilar breath sounds Abdomen: Soft, non tender, bowel sounds present. Extremities: Mild bilateral lower extremity edema. Wounds: Clean and dry.  No erythema or signs of infection.   Discharge  Medications:  The patient has been discharged on:   1.Beta Blocker:  Yes [ X  ]                              No   [   ]                              If No, reason:  2.Ace Inhibitor/ARB: Yes [   ]                                     No  [ x   ]                                     If No, reason:Hope to start after discharge if BP improved  3.Statin:   Yes [ X  ]                  No  [   ]                  If No, reason:  4.Ecasa:  Yes  [ X  ]                  No   [   ]                  If No, reason:  Patient had ACS upon admission:  Plavix/P2Y12 inhibitor: Yes [   ]                                      No  [ X  ]      Allergies as of 04/20/2021       Reactions   Codeine Nausea Only   Lipitor [atorvastatin] Other (See Comments)   Myalgia        Medication List     STOP taking these medications    AMBULATORY NON FORMULARY MEDICATION   carvedilol 25 MG tablet Commonly known as: COREG   lisinopril 5 MG tablet Commonly known as: ZESTRIL  nitroGLYCERIN 0.4 MG SL tablet Commonly known as: Nitrostat       TAKE these medications    aspirin 325 MG EC tablet Take 1 tablet (325 mg total) by mouth daily. What changed:  medication strength how much to take when to take this Another medication with the same name was removed. Continue taking this medication, and follow the directions you see here.   furosemide 40 MG tablet Commonly known as: LASIX Take 1 tablet (40 mg total) by mouth daily. For 5 days then stop.   Metoprolol Tartrate 37.5 MG Tabs Take 37.5 mg by mouth 2 (two) times daily.   multivitamin with minerals Tabs tablet Take 1 tablet by mouth daily.   ondansetron 4 MG tablet Commonly known as: ZOFRAN Take 4 mg by mouth every 8 (eight) hours as needed for nausea/vomiting, nausea or vomiting.   oxyCODONE 5 MG immediate release tablet Commonly known as: Oxy IR/ROXICODONE Take 1 tablet (5 mg total) by mouth every 6 (six) hours as  needed. What changed: reasons to take this   potassium chloride SA 20 MEQ tablet Commonly known as: KLOR-CON Take 1 tablet (20 mEq total) by mouth daily. For 5 days then stop.   rosuvastatin 40 MG tablet Commonly known as: CRESTOR Take 40 mg by mouth daily.   terbinafine 250 MG tablet Commonly known as: LamISIL Take 1 tablet (250 mg total) by mouth daily. What changed: when to take this   tiZANidine 2 MG tablet Commonly known as: ZANAFLEX Take 4 mg by mouth every 8 (eight) hours as needed for muscle spasms.        Follow-up Information     Lightfoot, Lucile Crater, MD Follow up.   Specialty: Cardiothoracic Surgery Contact information: [address] [place] [address] [phone]         Charlie Pitter, PA-C. Go on 05/04/2021.   Specialties: Cardiology, Radiology Why: Appointment time is at 2:20 pm Contact information: [address] [place] [address] [phone]                 Signed: Lars Pinks PA-C 04/20/2021, 7:19 AM

## 2021-04-17 ENCOUNTER — Inpatient Hospital Stay (HOSPITAL_COMMUNITY): Payer: PPO

## 2021-04-17 DIAGNOSIS — R079 Chest pain, unspecified: Secondary | ICD-10-CM

## 2021-04-17 LAB — BASIC METABOLIC PANEL
Anion gap: 5 (ref 5–15)
BUN: 9 mg/dL (ref 8–23)
CO2: 27 mmol/L (ref 22–32)
Calcium: 8.2 mg/dL — ABNORMAL LOW (ref 8.9–10.3)
Chloride: 103 mmol/L (ref 98–111)
Creatinine, Ser: 0.87 mg/dL (ref 0.61–1.24)
GFR, Estimated: >60 mL/min (ref >60)
Glucose, Bld: 136 mg/dL — ABNORMAL HIGH (ref 70–99)
Potassium: 4 mmol/L (ref 3.5–5.1)
Sodium: 135 mmol/L (ref 135–145)

## 2021-04-17 LAB — CBC
HCT: 26.7 % — ABNORMAL LOW (ref 39.0–52.0)
Hemoglobin: 9 g/dL — ABNORMAL LOW (ref 13.0–17.0)
MCH: 33.5 pg (ref 26.0–34.0)
MCHC: 33.7 g/dL (ref 30.0–36.0)
MCV: 99.3 fL (ref 80.0–100.0)
Platelets: 147 10*3/uL — ABNORMAL LOW (ref 150–400)
RBC: 2.69 MIL/uL — ABNORMAL LOW (ref 4.22–5.81)
RDW: 13.5 % (ref 11.5–15.5)
WBC: 13.3 10*3/uL — ABNORMAL HIGH (ref 4.0–10.5)
nRBC: 0 % (ref 0.0–0.2)

## 2021-04-17 LAB — GLUCOSE, CAPILLARY
Glucose-Capillary: 135 mg/dL — ABNORMAL HIGH (ref 70–99)
Glucose-Capillary: 124 mg/dL — ABNORMAL HIGH (ref 70–99)
Glucose-Capillary: 127 mg/dL — ABNORMAL HIGH (ref 70–99)
Glucose-Capillary: 107 mg/dL — ABNORMAL HIGH (ref 70–99)
Glucose-Capillary: 125 mg/dL — ABNORMAL HIGH (ref 70–99)

## 2021-04-17 MED ORDER — METOCLOPRAMIDE HCL 5 MG/5ML PO SOLN
10.0000 mg | Freq: Three times a day (TID) | ORAL | Status: DC
  Administered 2021-04-17 – 2021-04-20 (×7): 10 mg via ORAL
  Filled 2021-04-17 (×14): qty 10

## 2021-04-17 MED ORDER — INSULIN ASPART 100 UNIT/ML IJ SOLN
0.0000 [IU] | Freq: Three times a day (TID) | INTRAMUSCULAR | Status: DC
  Administered 2021-04-17: 2 [IU] via SUBCUTANEOUS

## 2021-04-17 MED ORDER — KETOROLAC TROMETHAMINE 15 MG/ML IJ SOLN
15.0000 mg | Freq: Four times a day (QID) | INTRAMUSCULAR | Status: AC
  Administered 2021-04-17 – 2021-04-19 (×8): 15 mg via INTRAVENOUS
  Filled 2021-04-17 (×8): qty 1

## 2021-04-17 MED ORDER — ORAL CARE MOUTH RINSE
15.0000 mL | Freq: Two times a day (BID) | OROMUCOSAL | Status: DC
  Administered 2021-04-18 – 2021-04-20 (×5): 15 mL via OROMUCOSAL

## 2021-04-17 MED ORDER — SODIUM CHLORIDE 0.9 % IV SOLN
12.5000 mg | Freq: Four times a day (QID) | INTRAVENOUS | Status: DC | PRN
  Administered 2021-04-17 (×2): 12.5 mg via INTRAVENOUS
  Filled 2021-04-17 (×2): qty 0.5

## 2021-04-17 NOTE — Progress Notes (Signed)
      [address]       [place] [address]             [phone]      POD # 2  No nausea at present  BP 110/81   Pulse 85   Temp 98.3 F (36.8 C) (Oral)   Resp 11   Ht 5\' 10"  (1.778 m)   Wt 104.8 kg   SpO2 99%   BMI 33.15 kg/m   Intake/Output Summary (Last 24 hours) at 04/17/2021 1654 Last data filed at 04/17/2021 1400 Gross per 24 hour  Intake 596.62 ml  Output 972 ml  Net -375.38 ml   Continue current care  Ethleen Lormand C. Roxan Hockey, MD [facility] [phone]

## 2021-04-17 NOTE — Plan of Care (Signed)
  Problem: Clinical Measurements: Goal: Ability to maintain clinical measurements within normal limits will improve Outcome: Progressing Goal: Will remain free from infection Outcome: Progressing Goal: Diagnostic test results will improve Outcome: Progressing Goal: Respiratory complications will improve Outcome: Progressing Goal: Cardiovascular complication will be avoided Outcome: Progressing   Problem: Nutrition: Goal: Adequate nutrition will be maintained Outcome: Not Progressing Note: Pt with postop nausea, poor appetite at this time   Problem: Coping: Goal: Level of anxiety will decrease Outcome: Progressing   Problem: Activity: Goal: Risk for activity intolerance will decrease Outcome: Progressing   Problem: Cardiac: Goal: Will achieve and/or maintain hemodynamic stability Outcome: Progressing   Problem: Clinical Measurements: Goal: Postoperative complications will be avoided or minimized Outcome: Progressing   Problem: Respiratory: Goal: Respiratory status will improve Outcome: Progressing   Problem: Skin Integrity: Goal: Wound healing without signs and symptoms of infection Outcome: Progressing Goal: Risk for impaired skin integrity will decrease Outcome: Progressing   Problem: Urinary Elimination: Goal: Ability to achieve and maintain adequate renal perfusion and functioning will improve Outcome: Progressing

## 2021-04-17 NOTE — Progress Notes (Signed)
2 Days Post-Op Procedure(s) (LRB): CORONARY ARTERY BYPASS GRAFTING (CABG) x 3 ON CARDIOPULMONARY BYPASS (N/A) AORTIC VALVE REPLACEMENT (AVR) USING 27 MM INSPIRIS RESILIA  AORTIC VALVE (N/A) ENDOVEIN HARVEST OF GREATER SAPHENOUS VEIN (Right) APPLICATION OF CELL SAVER (N/A) Subjective: C/o nausea at present, pain has not been well controlled  Objective: Vital signs in last 24 hours: Temp:  [97.7 F (36.5 C)-101.3 F (38.5 C)] 98 F (36.7 C) (11/19 0429) Pulse Rate:  [47-107] 95 (11/19 0600) Cardiac Rhythm: Normal sinus rhythm (11/19 0400) Resp:  [0-35] 26 (11/19 0600) BP: (80-143)/(56-88) 143/88 (11/19 0600) SpO2:  [87 %-100 %] 96 % (11/19 0600) Arterial Line BP: (68-135)/(36-77) 76/62 (11/18 1630) Weight:  [104.8 kg] 104.8 kg (11/19 0500)  Hemodynamic parameters for last 24 hours:    Intake/Output from previous day: 11/18 0701 - 11/19 0700 In: 1291.5 [P.O.:240; I.V.:751.4; IV Piggyback:300.1] Out: 1160 [Urine:920; Chest Tube:240] Intake/Output this shift: Total I/O In: -  Out: 55 [Urine:45; Chest Tube:10]  General appearance: cooperative and mild distress Neurologic: intact Heart: regular rate and rhythm Lungs: diminished breath sounds bibasilar Abdomen: mildly distended, hypoactive BS  Lab Results: Recent Labs    04/16/21 1621 04/17/21 0447  WBC 10.4 13.3*  HGB 9.2* 9.0*  HCT 26.4* 26.7*  PLT 131* 147*   BMET:  Recent Labs    04/16/21 1621 04/17/21 0447  NA 136 135  K 4.1 4.0  CL 106 103  CO2 22 27  GLUCOSE 122* 136*  BUN 7* 9  CREATININE 0.89 0.87  CALCIUM 7.7* 8.2*    PT/INR:  Recent Labs    04/15/21 2049  LABPROT 17.6*  INR 1.4*   ABG    Component Value Date/Time   PHART 7.419 04/16/2021 0953   HCO3 25.0 04/16/2021 0953   TCO2 26 04/16/2021 0953   O2SAT 98.0 04/16/2021 0953   CBG (last 3)  Recent Labs    04/16/21 2307 04/17/21 0423 04/17/21 0728  GLUCAP 131* 135* 124*    Assessment/Plan: S/P Procedure(s) (LRB): CORONARY  ARTERY BYPASS GRAFTING (CABG) x 3 ON CARDIOPULMONARY BYPASS (N/A) AORTIC VALVE REPLACEMENT (AVR) USING 27 MM INSPIRIS RESILIA  AORTIC VALVE (N/A) ENDOVEIN HARVEST OF GREATER SAPHENOUS VEIN (Right) APPLICATION OF CELL SAVER (N/A) - POD # 2 NEURO- intact CV- in Sr with episodes of bigeminy  ASA, statin, beta blocker RESP- continue IS for atelectasis RENAL- creatinine and lytes OK ENDO- CBG mildly elevated  Change SSI to AC and HS GI- nausea limiting factor currently  Will give Reglan for 24 hours  Phenergan PRN Anemia secondary to ABL- stable, follow SCD + enoxaparin for DVT prophylaxis Pain control poor with narcotics- will add toradol   LOS: 4 days    Melrose Nakayama 04/17/2021

## 2021-04-18 ENCOUNTER — Inpatient Hospital Stay (HOSPITAL_COMMUNITY): Payer: PPO

## 2021-04-18 LAB — CBC
HCT: 24.6 % — ABNORMAL LOW (ref 39.0–52.0)
Hemoglobin: 8.2 g/dL — ABNORMAL LOW (ref 13.0–17.0)
MCH: 33.2 pg (ref 26.0–34.0)
MCHC: 33.3 g/dL (ref 30.0–36.0)
MCV: 99.6 fL (ref 80.0–100.0)
Platelets: 133 10*3/uL — ABNORMAL LOW (ref 150–400)
RBC: 2.47 MIL/uL — ABNORMAL LOW (ref 4.22–5.81)
RDW: 13.3 % (ref 11.5–15.5)
WBC: 9.9 10*3/uL (ref 4.0–10.5)
nRBC: 0 % (ref 0.0–0.2)

## 2021-04-18 LAB — BASIC METABOLIC PANEL
Anion gap: 7 (ref 5–15)
BUN: 21 mg/dL (ref 8–23)
CO2: 26 mmol/L (ref 22–32)
Calcium: 8.2 mg/dL — ABNORMAL LOW (ref 8.9–10.3)
Chloride: 103 mmol/L (ref 98–111)
Creatinine, Ser: 0.99 mg/dL (ref 0.61–1.24)
GFR, Estimated: >60 mL/min (ref >60)
Glucose, Bld: 130 mg/dL — ABNORMAL HIGH (ref 70–99)
Potassium: 3.6 mmol/L (ref 3.5–5.1)
Sodium: 136 mmol/L (ref 135–145)

## 2021-04-18 LAB — GLUCOSE, CAPILLARY
Glucose-Capillary: 109 mg/dL — ABNORMAL HIGH (ref 70–99)
Glucose-Capillary: 98 mg/dL (ref 70–99)
Glucose-Capillary: 85 mg/dL (ref 70–99)
Glucose-Capillary: 97 mg/dL (ref 70–99)

## 2021-04-18 MED ORDER — ~~LOC~~ CARDIAC SURGERY, PATIENT & FAMILY EDUCATION: Freq: Once | Status: AC

## 2021-04-18 MED ORDER — SODIUM CHLORIDE 0.9% FLUSH: 3.0000 mL | INTRAVENOUS | Status: DC | PRN

## 2021-04-18 MED ORDER — POTASSIUM CHLORIDE CRYS ER 20 MEQ PO TBCR
40.0000 meq | EXTENDED_RELEASE_TABLET | Freq: Two times a day (BID) | ORAL | Status: AC
  Administered 2021-04-18 (×2): 40 meq via ORAL
  Filled 2021-04-18 (×2): qty 2

## 2021-04-18 MED ORDER — ZOLPIDEM TARTRATE 5 MG PO TABS
5.0000 mg | ORAL_TABLET | Freq: Every evening | ORAL | Status: DC | PRN
  Administered 2021-04-19: 5 mg via ORAL
  Filled 2021-04-18: qty 1

## 2021-04-18 MED ORDER — SODIUM CHLORIDE 0.9 % IV SOLN: 250.0000 mL | INTRAVENOUS | Status: DC | PRN

## 2021-04-18 MED ORDER — SODIUM CHLORIDE 0.9% FLUSH
3.0000 mL | Freq: Two times a day (BID) | INTRAVENOUS | Status: DC
  Administered 2021-04-18 – 2021-04-20 (×4): 3 mL via INTRAVENOUS

## 2021-04-18 MED ORDER — ALUM & MAG HYDROXIDE-SIMETH 200-200-20 MG/5ML PO SUSP: 15.0000 mL | Freq: Four times a day (QID) | ORAL | Status: DC | PRN

## 2021-04-18 MED ORDER — MAGNESIUM HYDROXIDE 400 MG/5ML PO SUSP: 30.0000 mL | Freq: Every day | ORAL | Status: DC | PRN

## 2021-04-18 MED ORDER — FUROSEMIDE 40 MG PO TABS
40.0000 mg | ORAL_TABLET | Freq: Every day | ORAL | Status: DC
  Administered 2021-04-18 – 2021-04-20 (×3): 40 mg via ORAL
  Filled 2021-04-18 (×3): qty 1

## 2021-04-18 NOTE — Progress Notes (Signed)
Pt admitted to [facility] from [facility] 2H.  Pt is A&O X4 and neuro intact.  Pt placed on telemetry and CCMD notified.  CHG bath completed.  Vitals taken and within normal range.  Pt is currently comfortable.

## 2021-04-18 NOTE — Progress Notes (Signed)
3 Days Post-Op Procedure(s) (LRB): CORONARY ARTERY BYPASS GRAFTING (CABG) x 3 ON CARDIOPULMONARY BYPASS (N/A) AORTIC VALVE REPLACEMENT (AVR) USING 27 MM INSPIRIS RESILIA  AORTIC VALVE (N/A) ENDOVEIN HARVEST OF GREATER SAPHENOUS VEIN (Right) APPLICATION OF CELL SAVER (N/A) Subjective: Nausea better today, has been able to eat   Objective: Vital signs in last 24 hours: Temp:  [98.1 F (36.7 C)-98.9 F (37.2 C)] 98.6 F (37 C) (11/20 1200) Pulse Rate:  [72-114] 82 (11/20 1200) Cardiac Rhythm: Normal sinus rhythm;Sinus tachycardia (11/20 1200) Resp:  [9-21] 19 (11/20 1200) BP: (92-138)/(56-86) 123/81 (11/20 1200) SpO2:  [90 %-100 %] 100 % (11/20 1200) Weight:  [104.2 kg] 104.2 kg (11/20 0400)  Hemodynamic parameters for last 24 hours:    Intake/Output from previous day: 11/19 0701 - 11/20 0700 In: 530.4 [P.O.:330; I.V.:0.3; IV Piggyback:200.1] Out: 507 [Urine:457; Chest Tube:50] Intake/Output this shift: Total I/O In: -  Out: 302 [Urine:302]  General appearance: alert, cooperative, and no distress Neurologic: intact Heart: regular rate and rhythm Lungs: diminished breath sounds bibasilar Abdomen: mildly distended, nontender  Lab Results: Recent Labs    04/17/21 0447 04/18/21 0425  WBC 13.3* 9.9  HGB 9.0* 8.2*  HCT 26.7* 24.6*  PLT 147* 133*   BMET:  Recent Labs    04/17/21 0447 04/18/21 0425  NA 135 136  K 4.0 3.6  CL 103 103  CO2 27 26  GLUCOSE 136* 130*  BUN 9 21  CREATININE 0.87 0.99  CALCIUM 8.2* 8.2*    PT/INR:  Recent Labs    04/15/21 2049  LABPROT 17.6*  INR 1.4*   ABG    Component Value Date/Time   PHART 7.419 04/16/2021 0953   HCO3 25.0 04/16/2021 0953   TCO2 26 04/16/2021 0953   O2SAT 98.0 04/16/2021 0953   CBG (last 3)  Recent Labs    04/17/21 2146 04/18/21 0626 04/18/21 1113  GLUCAP 125* 109* 98    Assessment/Plan: S/P Procedure(s) (LRB): CORONARY ARTERY BYPASS GRAFTING (CABG) x 3 ON CARDIOPULMONARY BYPASS (N/A) AORTIC  VALVE REPLACEMENT (AVR) USING 27 MM INSPIRIS RESILIA  AORTIC VALVE (N/A) ENDOVEIN HARVEST OF GREATER SAPHENOUS VEIN (Right) APPLICATION OF CELL SAVER (N/A) POD # 3  CV- in SR.  ASA, statin, beta blocker RESP- continue IS for LLL atelectasis RENAL- creatinine normal, weight near preop ENDO- CBG well controlled Anemia secondary to ABL- follow GI- tolerating PO, + BM Doing well, transfer to 4E   LOS: 5 days    Melrose Nakayama 04/18/2021

## 2021-04-19 ENCOUNTER — Inpatient Hospital Stay (HOSPITAL_COMMUNITY): Payer: PPO

## 2021-04-19 LAB — TYPE AND SCREEN
ABO/RH(D): O POS
Antibody Screen: NEGATIVE
Unit division: 0
Unit division: 0

## 2021-04-19 LAB — CBC
HCT: 22.6 % — ABNORMAL LOW (ref 39.0–52.0)
Hemoglobin: 7.8 g/dL — ABNORMAL LOW (ref 13.0–17.0)
MCH: 33.8 pg (ref 26.0–34.0)
MCHC: 34.5 g/dL (ref 30.0–36.0)
MCV: 97.8 fL (ref 80.0–100.0)
Platelets: 122 10*3/uL — ABNORMAL LOW (ref 150–400)
RBC: 2.31 MIL/uL — ABNORMAL LOW (ref 4.22–5.81)
RDW: 13.2 % (ref 11.5–15.5)
WBC: 7.9 10*3/uL (ref 4.0–10.5)
nRBC: 0 % (ref 0.0–0.2)

## 2021-04-19 LAB — GLUCOSE, CAPILLARY: Glucose-Capillary: 105 mg/dL — ABNORMAL HIGH (ref 70–99)

## 2021-04-19 LAB — BPAM RBC
Blood Product Expiration Date: 202211222359
Blood Product Expiration Date: 202212082359
ISSUE DATE / TIME: 202211161234
ISSUE DATE / TIME: 202211201328
Unit Type and Rh: 5100
Unit Type and Rh: 5100

## 2021-04-19 LAB — BASIC METABOLIC PANEL
Anion gap: 6 (ref 5–15)
BUN: 19 mg/dL (ref 8–23)
CO2: 26 mmol/L (ref 22–32)
Calcium: 7.8 mg/dL — ABNORMAL LOW (ref 8.9–10.3)
Chloride: 104 mmol/L (ref 98–111)
Creatinine, Ser: 0.86 mg/dL (ref 0.61–1.24)
GFR, Estimated: >60 mL/min (ref >60)
Glucose, Bld: 94 mg/dL (ref 70–99)
Potassium: 4 mmol/L (ref 3.5–5.1)
Sodium: 136 mmol/L (ref 135–145)

## 2021-04-19 LAB — SURGICAL PATHOLOGY

## 2021-04-19 MED ORDER — METOPROLOL TARTRATE 25 MG/10 ML ORAL SUSPENSION
25.0000 mg | Freq: Two times a day (BID) | ORAL | Status: DC
  Filled 2021-04-19: qty 10

## 2021-04-19 MED ORDER — POTASSIUM CHLORIDE CRYS ER 20 MEQ PO TBCR
20.0000 meq | EXTENDED_RELEASE_TABLET | Freq: Every day | ORAL | Status: DC
  Administered 2021-04-19 – 2021-04-20 (×2): 20 meq via ORAL
  Filled 2021-04-19 (×2): qty 1

## 2021-04-19 MED ORDER — ASPIRIN 325 MG PO TBEC: 325.0000 mg | DELAYED_RELEASE_TABLET | Freq: Every day | ORAL | 0 refills | Status: DC

## 2021-04-19 MED ORDER — METOPROLOL TARTRATE 25 MG PO TABS
25.0000 mg | ORAL_TABLET | Freq: Two times a day (BID) | ORAL | Status: DC
  Administered 2021-04-19 (×2): 25 mg via ORAL
  Filled 2021-04-19 (×2): qty 1

## 2021-04-19 NOTE — Progress Notes (Addendum)
CARDIAC REHAB PHASE I   PRE:  Rate/Rhythm: 95 SR first deg    BP: sitting 140/84    SaO2: 95 RA  MODE:  Ambulation: 400 ft   POST:  Rate/Rhythm: 140 first deg with increased PVCs    BP: sitting 149/90     SaO2: 99 RA  Pt moving well, used RW. HR increased with activity, mostly 130s. Irregular due to PVCs. Rested x2. To recliner, HR decreased and clear that its first deg. Practiced IS, 1000 ml. No major c/o, eager to d/c. Has RW at home. [phone]   [place], ACSM 04/19/2021 10:04 AM

## 2021-04-19 NOTE — Progress Notes (Signed)
Pt ambulated 400 ft. Maintained NSR, HR <100, Patient back to bed VSS.   Chrisandra Carota, RN 04/19/2021 11:48 AM

## 2021-04-19 NOTE — Progress Notes (Signed)
Mobility Specialist: Progress Note   04/19/21 1534  Mobility  Activity Ambulated in hall  Level of Assistance Modified independent, requires aide device or extra time  Assistive Device Front wheel walker  Distance Ambulated (ft) 400 ft  Mobility Ambulated with assistance in hallway  Mobility Response Tolerated well  Mobility performed by Mobility specialist  $Mobility charge 1 Mobility   Pre-Mobility: 88 HR Post-Mobility: 89 HR  Pt mod I to sit EOB from supine as well as to stand. Pt had no c/o during ambulation, states he's ready to get out of here. Pt back to bed with call bell at his side and family member present in the room.   [facility] Jalicia Roszak Mobility Specialist Mobility Specialist Phone #1: [phone] Mobility Specialist Phone #2: [phone]

## 2021-04-19 NOTE — Progress Notes (Addendum)
      [address]       [place] [address]             [phone]        4 Days Post-Op Procedure(s) (LRB): CORONARY ARTERY BYPASS GRAFTING (CABG) x 3 ON CARDIOPULMONARY BYPASS (N/A) AORTIC VALVE REPLACEMENT (AVR) USING 27 MM INSPIRIS RESILIA  AORTIC VALVE (N/A) ENDOVEIN HARVEST OF GREATER SAPHENOUS VEIN (Right) APPLICATION OF CELL SAVER (N/A)  Subjective: Patient states nausea resolved. He has already had a bowel movement  Objective: Vital signs in last 24 hours: Temp:  [98 F (36.7 C)-98.8 F (37.1 C)] 98 F (36.7 C) (11/21 0340) Pulse Rate:  [67-97] 84 (11/21 0340) Cardiac Rhythm: Normal sinus rhythm (11/20 2013) Resp:  [15-20] 18 (11/21 0340) BP: (100-147)/(56-82) 136/79 (11/21 0340) SpO2:  [95 %-100 %] 96 % (11/21 0340) Weight:  [104.1 kg-106.3 kg] 104.1 kg (11/21 0340)  Pre op weight  101.6 kg Current Weight  04/19/21 104.1 kg      Intake/Output from previous day: 11/20 0701 - 11/21 0700 In: -  Out: 43 [Urine:387]   Physical Exam:  Cardiovascular: RRR, rub Pulmonary: Clear to auscultation on right and diminished left basilar breath sounds Abdomen: Soft, non tender, bowel sounds present. Extremities: Mild bilateral lower extremity edema. Wounds: Clean and dry.  No erythema or signs of infection.  Lab Results: CBC: Recent Labs    04/18/21 0425 04/19/21 0319  WBC 9.9 7.9  HGB 8.2* 7.8*  HCT 24.6* 22.6*  PLT 133* 122*   BMET:  Recent Labs    04/18/21 0425 04/19/21 0319  NA 136 136  K 3.6 4.0  CL 103 104  CO2 26 26  GLUCOSE 130* 94  BUN 21 19  CREATININE 0.99 0.86  CALCIUM 8.2* 7.8*    PT/INR:  Lab Results  Component Value Date   INR 1.4 (H) 04/15/2021   INR 1.07 01/21/2016   INR 0.96 01/20/2016   ABG:  INR: Will add last result for INR, ABG once components are confirmed Will add last 4 CBG results once components are confirmed  Assessment/Plan:  1. CV - SR, first degree heart block, PVCs. On Lopressor  12.5 mg bid but will increase for better HR control 2.  Pulmonary - On room air. Encourage incentive spirometer. 3. Volume Overload - On Lasix 40 mg daily 4.  Expected post op acute blood loss anemia - H and H this am decreased to 7.8 and 22.6. 5. CBGs 85/97/105. Pre op HGA1C 4.9. Stop accu checks and SS PRN 6. Mild thrombocytopenia-platelets this am decreased to 122,000 7. GI-nausea resolved. On Reglan, Zofran PRN 8. Remove EPW later this am 9. Discharge in am  Donielle M ZimmermanPA-C 04/19/2021,6:58 AM    Agree with above Continue physical therapy Home tomorrow  Lajuana Matte

## 2021-04-19 NOTE — Progress Notes (Signed)
Patient's primary RN Lorra Hals advised that patient is refusing telemetry due to the sounds from the monitor disturbing his sleep. I advised I would discuss with patient. Patient stated he would not wear monitor because he wanted to sleep. I advised that monitoring was necessary to maintain his safety following heart surgery and also to aid in determining if medication adjustments made today were appropriate. Patient still refused to wear the monitor. He stated he wanted to sleep and did not want to be disturbed overnight. I again advised that monitoring was needed for his safety and his care. Asked patient if he understood the risk of not wearing monitor he stated he did understand but wanted to sleep. He said "the doctor can put it on one time in the morning before I leave to make sure everything looks good". Advised Tie patient's primary RN of my conversation with patient.

## 2021-04-19 NOTE — Care Management Important Message (Signed)
Important Message  Patient Details  Name: Dwayne [name] MRN: [id number] Date of Birth: 29-Oct-1955   Medicare Important Message Given:  Yes     Shelda Altes 04/19/2021, 9:51 AM

## 2021-04-19 NOTE — Progress Notes (Signed)
Pacing wires pulled, metal end intact, VSS, call light within reach, bedrest started.    Chrisandra Carota, RN 04/19/2021 12:01 PM

## 2021-04-20 ENCOUNTER — Telehealth: Payer: Self-pay | Admitting: Cardiovascular Disease

## 2021-04-20 DIAGNOSIS — I351 Nonrheumatic aortic (valve) insufficiency: Secondary | ICD-10-CM

## 2021-04-20 MED ORDER — OXYCODONE HCL 5 MG PO TABS: 5.0000 mg | ORAL_TABLET | Freq: Four times a day (QID) | ORAL | 0 refills | Status: DC | PRN

## 2021-04-20 MED ORDER — METOPROLOL TARTRATE 25 MG PO TABS
37.5000 mg | ORAL_TABLET | Freq: Two times a day (BID) | ORAL | Status: DC
  Administered 2021-04-20: 37.5 mg via ORAL
  Filled 2021-04-20: qty 1

## 2021-04-20 MED ORDER — METOPROLOL TARTRATE 25 MG/10 ML ORAL SUSPENSION: 37.5000 mg | Freq: Two times a day (BID) | ORAL | Status: DC

## 2021-04-20 MED ORDER — LOSARTAN POTASSIUM 25 MG PO TABS: 25.0000 mg | ORAL_TABLET | Freq: Every day | ORAL | Status: DC

## 2021-04-20 MED ORDER — METOPROLOL TARTRATE 37.5 MG PO TABS: 37.5000 mg | ORAL_TABLET | Freq: Two times a day (BID) | ORAL | 1 refills | Status: DC

## 2021-04-20 MED ORDER — POTASSIUM CHLORIDE CRYS ER 20 MEQ PO TBCR: 20.0000 meq | EXTENDED_RELEASE_TABLET | Freq: Every day | ORAL | 0 refills | Status: DC

## 2021-04-20 MED ORDER — FUROSEMIDE 40 MG PO TABS
40.0000 mg | ORAL_TABLET | Freq: Every day | ORAL | 0 refills | Status: DC
Start: 2021-04-20 — End: 2021-05-04

## 2021-04-20 MED FILL — Heparin Sodium (Porcine) Inj 1000 Unit/ML: Qty: 1000 | Status: AC

## 2021-04-20 MED FILL — Electrolyte-R (PH 7.4) Solution: INTRAVENOUS | Qty: 4000 | Status: AC

## 2021-04-20 MED FILL — Mannitol IV Soln 20%: INTRAVENOUS | Qty: 500 | Status: AC

## 2021-04-20 MED FILL — Sodium Bicarbonate IV Soln 8.4%: INTRAVENOUS | Qty: 50 | Status: AC

## 2021-04-20 MED FILL — Calcium Chloride Inj 10%: INTRAVENOUS | Qty: 10 | Status: AC

## 2021-04-20 MED FILL — Heparin Sodium (Porcine) Inj 1000 Unit/ML: INTRAMUSCULAR | Qty: 10 | Status: AC

## 2021-04-20 MED FILL — Sodium Chloride IV Soln 0.9%: INTRAVENOUS | Qty: 2000 | Status: AC

## 2021-04-20 MED FILL — Potassium Chloride Inj 2 mEq/ML: INTRAVENOUS | Qty: 40 | Status: AC

## 2021-04-20 MED FILL — Lidocaine HCl Local Preservative Free (PF) Inj 2%: INTRAMUSCULAR | Qty: 15 | Status: AC

## 2021-04-20 NOTE — Progress Notes (Addendum)
      [address]       [place], [address]             [phone]        5 Days Post-Op Procedure(s) (LRB): CORONARY ARTERY BYPASS GRAFTING (CABG) x 3 ON CARDIOPULMONARY BYPASS (N/A) AORTIC VALVE REPLACEMENT (AVR) USING 27 MM INSPIRIS RESILIA  AORTIC VALVE (N/A) ENDOVEIN HARVEST OF GREATER SAPHENOUS VEIN (Right) APPLICATION OF CELL SAVER (N/A)  Subjective: Patient sitting in chair. He wants to go home.  Objective: Vital signs in last 24 hours: Temp:  [97.5 F (36.4 C)-99.3 F (37.4 C)] 97.5 F (36.4 C) (11/22 0351) Pulse Rate:  [81-100] 89 (11/22 0351) Cardiac Rhythm: Normal sinus rhythm (11/21 2222) Resp:  [19-21] 19 (11/22 0351) BP: (121-166)/(67-103) 139/85 (11/22 0352) SpO2:  [95 %-100 %] 100 % (11/22 0351) Weight:  [102.2 kg] 102.2 kg (11/22 0639)  Pre op weight  101.6 kg Current Weight  04/20/21 102.2 kg      Intake/Output from previous day: 11/21 0701 - 11/22 0700 In: 80 [P.O.:490] Out: -    Physical Exam:  Cardiovascular: RRR, rub Pulmonary: Clear to auscultation on right and slightly diminished left basilar breath sounds Abdomen: Soft, non tender, bowel sounds present. Extremities: Mild bilateral lower extremity edema. Wounds: Clean and dry.  No erythema or signs of infection.  Lab Results: CBC: Recent Labs    04/18/21 0425 04/19/21 0319  WBC 9.9 7.9  HGB 8.2* 7.8*  HCT 24.6* 22.6*  PLT 133* 122*    BMET:  Recent Labs    04/18/21 0425 04/19/21 0319  NA 136 136  K 3.6 4.0  CL 103 104  CO2 26 26  GLUCOSE 130* 94  BUN 21 19  CREATININE 0.99 0.86  CALCIUM 8.2* 7.8*     PT/INR:  Lab Results  Component Value Date   INR 1.4 (H) 04/15/2021   INR 1.07 01/21/2016   INR 0.96 01/20/2016   ABG:  INR: Will add last result for INR, ABG once components are confirmed Will add last 4 CBG results once components are confirmed  Assessment/Plan:  1. CV - Previous SR, first degree heart block, PVCs. Patient's HR did  increase into the 130's yesterday around 10 am with walking.Patient refused further monitoring so not on tele as of yesterday afternoon.  On Lopressor 25 mg bid but will increase for better HR control 2.  Pulmonary - On room air. Encourage incentive spirometer. 3. Volume Overload - On Lasix 40 mg daily 4.  Expected post op acute blood loss anemia - Last H and H  decreased to 7.8 and 22.6. 5. Mild thrombocytopenia-Last platelets decreased to 122,000 6. Discharge   Yafet Cline M ZimmermanPA-C 04/20/2021,7:02 AM

## 2021-04-20 NOTE — Telephone Encounter (Signed)
Spoke with the patient who was just discharged from the hospital yesterday after a CABGx3. He is wondering if he could get a temporary handicap placard.  Spoke with Dr. Acie Fredrickson who has agreed to sign form for patient to take to the [facility] to get a temporary handicap placard for 3 months. Patient would like for the form to be mailed to him.

## 2021-04-20 NOTE — Progress Notes (Signed)
Discussed IS, sternal precautions, diet, exercise, and CRPII. Pt with eyes closed, sometimes dozing off, but voiced understanding when appropriate. Will refer to [place]. He has been ambulating with controlled HR before tele came off. [place], ACSM 9:23 AM 04/20/2021

## 2021-04-20 NOTE — Telephone Encounter (Signed)
Patient called and wanted to know what was the procedure to get a temporary handicap plackard.

## 2021-04-20 NOTE — Progress Notes (Addendum)
Progress Note  Patient Name: Dwayne [name] Date of Encounter: 04/20/2021  [facility] Cardiologist: Mertie Moores, MD   Subjective   Breathing is Ok in bed    Chest is sore Inpatient Medications    Scheduled Meds:  acetaminophen  1,000 mg Oral Q6H   Or   acetaminophen (TYLENOL) oral liquid 160 mg/5 mL  1,000 mg Per Tube Q6H   aspirin EC  325 mg Oral Daily   Or   aspirin  324 mg Per Tube Daily   bisacodyl  10 mg Oral Daily   Or   bisacodyl  10 mg Rectal Daily   Chlorhexidine Gluconate Cloth  6 each Topical Daily   docusate sodium  200 mg Oral Daily   enoxaparin (LOVENOX) injection  40 mg Subcutaneous QHS   furosemide  40 mg Oral Daily   mouth rinse  15 mL Mouth Rinse BID   methocarbamol  500 mg Oral TID   metoCLOPramide  10 mg Oral TID WC & HS   metoprolol tartrate  25 mg Oral BID   Or   metoprolol tartrate  25 mg Per Tube BID   pantoprazole  40 mg Oral Daily   potassium chloride  20 mEq Oral Daily   rosuvastatin  40 mg Oral Daily   sodium chloride flush  3 mL Intravenous Q12H   terbinafine  250 mg Oral QPM   Continuous Infusions:  sodium chloride     promethazine (PHENERGAN) injection (IM or IVPB) 12.5 mg (04/17/21 2126)   PRN Meds: sodium chloride, alum & mag hydroxide-simeth, magnesium hydroxide, metoprolol tartrate, ondansetron (ZOFRAN) IV, oxyCODONE, promethazine (PHENERGAN) injection (IM or IVPB), sodium chloride flush, zolpidem   Vital Signs    Vitals:   04/19/21 2217 04/19/21 2222 04/20/21 0351 04/20/21 0352  BP: (!) 166/103 (!) 157/92 139/85 139/85  Pulse: 100 98 89   Resp: 20 20 19    Temp: 99.3 F (37.4 C)  (!) 97.5 F (36.4 C)   TempSrc: Oral  Oral   SpO2: 98% 97% 100%   Weight:      Height:        Intake/Output Summary (Last 24 hours) at 04/20/2021 0630 Last data filed at 04/19/2021 2222 Gross per 24 hour  Intake 490 ml  Output --  Net 490 ml   Last 3 Weights 04/19/2021 04/18/2021 04/18/2021  Weight (lbs) 229 lb 8 oz 234 lb 5.6  oz 229 lb 11.5 oz  Weight (kg) 104.1 kg 106.3 kg 104.2 kg      Telemetry    SR  - Personally Reviewed  ECG    No new tracing- Personally Reviewed  Physical Exam   GEN: No acute distress.  Laying in bed comfortably Neck JVP is normal  Cardiac: RRR, soft systolic murmur Respiratory: Clear to auscultation bilaterally. GI: Soft, nontender, non-distended  MS: No LE edema  Neuro:  Nonfocal  Psych: Normal affect   Labs    High Sensitivity Troponin:   Recent Labs  Lab 04/12/21 1155 04/12/21 1404  TROPONINIHS 10 9     Chemistry Recent Labs  Lab 04/16/21 0303 04/16/21 0836 04/16/21 1621 04/17/21 0447 04/18/21 0425 04/19/21 0319  NA 136   < > 136 135 136 136  K 4.5   < > 4.1 4.0 3.6 4.0  CL 106  --  106 103 103 104  CO2 23  --  22 27 26 26   GLUCOSE 136*  --  122* 136* 130* 94  BUN 6*  --  7* 9 21 19   CREATININE 0.81  --  0.89 0.87 0.99 0.86  CALCIUM 7.6*  --  7.7* 8.2* 8.2* 7.8*  MG 2.6*  --  2.1  --   --   --   GFRNONAA >60  --  >60 >60 >60 >60  ANIONGAP 7  --  8 5 7 6    < > = values in this interval not displayed.    Lipids  No results for input(s): CHOL, TRIG, HDL, LABVLDL, LDLCALC, CHOLHDL in the last 168 hours.   Hematology Recent Labs  Lab 04/17/21 0447 04/18/21 0425 04/19/21 0319  WBC 13.3* 9.9 7.9  RBC 2.69* 2.47* 2.31*  HGB 9.0* 8.2* 7.8*  HCT 26.7* 24.6* 22.6*  MCV 99.3 99.6 97.8  MCH 33.5 33.2 33.8  MCHC 33.7 33.3 34.5  RDW 13.5 13.3 13.2  PLT 147* 133* 122*   Thyroid No results for input(s): TSH, FREET4 in the last 168 hours.  BNPNo results for input(s): BNP, PROBNP in the last 168 hours.  DDimer No results for input(s): DDIMER in the last 168 hours.   Radiology    DG Chest 2 View  Result Date: 04/19/2021 CLINICAL DATA:  Status post coronary bypass surgery and aortic valve replacement EXAM: CHEST - 2 VIEW COMPARISON:  Previous studies including the examination of 04/18/2021 FINDINGS: Transverse diameter of heart is increased. There is  increased density in the left lower lung field obscuring the left hemidiaphragm with slight improvement. There are no new infiltrates or signs of pulmonary edema. There is blunting of left lateral CP angle. There is no pneumothorax. IMPRESSION: Increased density in the left lower lung fields suggests small effusion and underlying infiltrate. There is slight improvement in aeration of left lower lung fields. Electronically Signed   By: Elmer Picker M.D.   On: 04/19/2021 08:37    Cardiac Studies  Cath 04/13/21:   Ost LM to Dist LM lesion is 80% stenosed.   Prox LAD to Mid LAD lesion is 5% stenosed.   Prox LAD lesion is 30% stenosed.   1st Diag lesion is 90% stenosed.   Ost Cx to Prox Cx lesion is 5% stenosed.   2nd Mrg lesion is 15% stenosed.   Prox RCA lesion is 10% stenosed.   Since the 2017 acute catheterization there is now severe 80% eccentric left main stenosis with calcification superiorly.   The proximal LAD stent is patent and the previously occluded jailed diagonal vessel is now a diminutive vessel.  There is 30% stenosis proximal to the stent and mild smooth 20% narrowing beyond the stented segment.    Non-obstructive mild circumflex and RCA stenosis with a dominant RCA and septal collateralization to the LAD.   EF by echo today was 45%.  LVEDP is increased at 28 mm.   RECOMMENDATION: Will heparinize 8 hours post TR band removal.  Will initiate nitrates and continue carvedilol.  Surgical consultation for CABG revascularization   TTE 04/13/21: IMPRESSIONS     1. Left ventricular ejection fraction, by estimation, is 45%. Left  ventricular ejection fraction by 2D MOD biplane is 45.2 %. The left  ventricle has mildly decreased function. The left ventricle demonstrates  regional wall motion abnormalities (Apical  hypokinesis without LV thrombus). Left ventricular diastolic parameters  are indeterminate.   2. Right ventricular systolic function is normal. The right  ventricular  size is normal. Tricuspid regurgitation signal is inadequate for assessing  PA pressure.   3. The mitral valve is grossly normal. Trivial mitral  valve  regurgitation.   4. The aortic valve is tricuspid. Aortic valve regurgitation is mild to  moderate. No aortic stenosis is present.   5. Aortic dilatation noted. There is mild dilatation of the aortic root,  measuring 43 mm.   6. The inferior vena cava is normal in size with greater than 50%  respiratory variability, suggesting right atrial pressure of 3 mmHg.   Transthoracic Echocardiogram: Date: 04/20/2016 Results: Study Conclusions   - Left ventricle: The cavity size was normal. Wall thickness was    normal. Systolic function was mildly to moderately reduced. The    estimated ejection fraction was in the range of 40% to 45%.    Diffuse hypokinesis. Doppler parameters are consistent with    abnormal left ventricular relaxation (grade 1 diastolic    dysfunction).  - Aortic valve: Trileaflet; mildly thickened, mildly calcified    leaflets. There was trivial regurgitation.  - Mitral valve: There was mild regurgitation.    Carotid Vascular Duplex: Date: 01/23/2016 Results: Bilateral: mild mixed plaque origin ICA. Mild soft plaque proximal  ICA and ECA. Vertebral artery flow is antegrade. 1-39% ICA  plaquing. Left ECA velocities are elevated.    Left/Right Heart Catheterizations: Date: 01/20/2016 Results: Mid RCA lesion, 10 %stenosed. Ost 1st Mrg to 1st Mrg lesion, 20 %stenosed. 2nd Mrg lesion, 20 %stenosed. Mid Cx lesion, 10 %stenosed. Prox Cx to Mid Cx lesion, 10 %stenosed. Ost LM to LM lesion, 30 %stenosed. Ost 1st Diag to 1st Diag lesion, 100 %stenosed. A STENT PROMUS PREM MR 3.5X16 drug eluting stent was successfully placed. Prox LAD lesion, 100 %stenosed. Post intervention, there is a 0% residual stenosis. Mid LAD lesion, 20 %stenosed. There is moderate left ventricular systolic dysfunction. LV end  diastolic pressure is mildly elevated. The left ventricular ejection fraction is 25-35% by visual estimate. There is no mitral valve regurgitation.   1. Acute anterior STEMI secondary to occluded proximal LAD 2. Successful PTCA/DES x 1 proximal to mid LAD 3. Mild disease in the left main, RCA and Circumflex 4. Moderate segmental LV systolic dysfunction   Recommendations: Will continue DAPT for one year with ASA and Brilinta. Will start beta blocker and change statin to Crestor. Will continue Ace-inh. Echo in before discharge.   Patient Profile     65 y.o. male with history of LAD STEMI s/p PCI in 2017, ischemic cardiomyopathy with LVEF 25-30% which improved to 40-45%, prior CVA with mild carotid disease, HTN and HLD who presented with chest pain with concern for UA. Cath revealed 80% distal LM now awaiting CABG evaluation.  Assessment & Plan    1  CAD    Pt with severe dz   He is now day 5 post CABG and AVR (LIMA to LAD; SVG to OM; SVG to D1:  AVR wit h27 mm Inspiris Resilia valve )  Volume status looks OK  Will make sure he has outpt follow up      2  AV dz   s/p AVR 27 mm Inspriris   Echo is pending    #Ischemic CM with LVEF 40-45%:   Follow up echo pending   WIll follow over time    Add low dose Cozaar   Should tolderate   On metoprolol  Will consolidated to XL as outpt vs coreg     #HLD: LDL well controlled at 53, TG 119, HDL 32 -Continue home crestor 40mg  daily   #HTN: -  Pt is on  37.5 bid (dose just increased)  Can  try to  add low dose cozaar to regimen 25 mg   ( he was on ACE I prior) BP should tolerate   If not now then as outpt.  Will make sure he has appt in cardiology in 2 wks     For questions or updates, please contact [facility] Please consult [email or web address] for contact info under        Signed, Dorris Carnes, MD  04/20/2021, 6:30 AM

## 2021-04-20 NOTE — Progress Notes (Signed)
Pt is alert, fully oriented. He is hemodynamically stable.  He expressed his frustration about the heart monitor and all the routine nursing and plan of care. He refused to wear heart monitor and pulse oxymetry tonight. Pt stated " Take this dam thing out of me! I don't want this monitor tonight anymore. I want to sleep. I haven't slept since I've been here!" I need something to help me sleep.   Pt agreed to take bedtime meds. We offered Ambient to help him sleep. His pain was well tolerated with Tylenol.   We explained the monitor necessity and risk for not wearing the heart monitor after and episode of high heart rate 140 BPM today when ambulated in hall way per RN day shift reported. Pt persistently refused the monitor. Ivin Booty, the in charge nurse made aware. We paged Dr. Kipp Brood, the on-call provider. Awaiting for response. Pt has no signs of acute distress. We will continue to monitor.  Adalea Handler. RN

## 2021-04-20 NOTE — Progress Notes (Signed)
Pt being D/C, VSS, Chest tube sutures removed, IV removed, education complete.   Chrisandra Carota, RN 04/20/2021 8:24 AM

## 2021-04-26 ENCOUNTER — Telehealth: Payer: Self-pay | Admitting: *Deleted

## 2021-04-26 NOTE — Telephone Encounter (Signed)
Patient's wife called with concerns over patient's recovery. Per wife, patient is experiencing pain, nausea and lack of appetite. Wife states patient had one spout of diarrhea last night. Per patient he takes oxycodone once in the am and once before bed. He takes Tylenol throughout the day for pain. He also reports taking zofran in the morning with his oxycodone. Patient reports bouts of nausea throughout the day. Per patient he has no appetite. Wife states he only wants Pepsi, [employer] and honey nut cheerios. Advised patient to increase food and protein intake, particularly prior to taking medications. Advised to stay hydrated throughout the day with water. Patient has virtual follow up appt with Dr. Kipp Brood and per patient's request, appt changed to in office appt. Patient and wife verbalize understanding.

## 2021-04-30 ENCOUNTER — Ambulatory Visit (INDEPENDENT_AMBULATORY_CARE_PROVIDER_SITE_OTHER): Payer: Self-pay | Admitting: Thoracic Surgery (Cardiothoracic Vascular Surgery)

## 2021-04-30 ENCOUNTER — Telehealth (HOSPITAL_COMMUNITY): Payer: Self-pay

## 2021-04-30 ENCOUNTER — Telehealth: Payer: PPO | Admitting: Thoracic Surgery (Cardiothoracic Vascular Surgery)

## 2021-04-30 ENCOUNTER — Other Ambulatory Visit: Payer: Self-pay

## 2021-04-30 VITALS — BP 120/83 | HR 110 | Resp 20 | Ht 70.0 in | Wt 217.0 lb

## 2021-04-30 DIAGNOSIS — Z951 Presence of aortocoronary bypass graft: Secondary | ICD-10-CM

## 2021-04-30 NOTE — Telephone Encounter (Signed)
Will check insurance benefits closer to scheduling and/or into the new year 2023. 

## 2021-04-30 NOTE — Progress Notes (Signed)
      [address]       [place] [address]             [phone]        [facility] Medical Record #[id number] Date of Birth: 11-06-1955  Referring: Troy Sine, MD Primary Care: Gaynelle Arabian, MD Primary Cardiologist:Philip Nahser, MD  Reason for visit:   follow-up  History of Present Illness:     65 year old male status post CABG presents in follow-up.  Overall he is doing well.  He has no complaints today.  Physical Exam: BP 120/83   Pulse (!) 110   Resp 20   Ht 5\' 10"  (1.778 m)   Wt 217 lb (98.4 kg)   SpO2 96% Comment: RA  BMI 31.14 kg/m   Alert NAD Incision clean.  Sternum stable Abdomen soft, ND No peripheral edema      Assessment / Plan:   64 year old male status post CABG AVR.  Currently doing well. Follow-up in 1 month with a chest x-ray.  If that is clear then he can be discharged to cardiac rehab.   Lajuana Matte 04/30/2021 4:54 PM

## 2021-04-30 NOTE — Telephone Encounter (Signed)
Called patient to see if he is interested in the Cardiac Rehab Program. Patient expressed interest. Explained scheduling process and went over insurance, patient verbalized understanding. Will contact patient for scheduling once f/u has been completed.  °

## 2021-05-03 NOTE — Progress Notes (Addendum)
Cardiology Office Note    Date:  05/04/2021   ID:  Dwayne [name], DOB [birth year], MRN [id number]  PCP:  Gaynelle Arabian, MD  Cardiologist:  Werner Lean, MD  Electrophysiologist:  None   Chief Complaint: f/u CABG, AVR  History of Present Illness:   Dwayne [name] is a 65 y.o. male with history of CAD (STEMI 2017 s/p PCI to LAD then recent CABG 04/2021), ICM/chronic combined CHF, prior CVA, mild carotid stenosis (near-normal by last duplex 03/2021), HTN, HLD, ED, internal hemorrhoids, splenomegaly, diverticulosis. He had prior STEMI of LAD in 2017 with ICM with initial EF 25-30% that improved to 40-45% after. He was previously seen by Dr. Acie Fredrickson but more recently Dr. Gasper Sells. He was recently admitted 03/2021 with chest pain and found to have multivessel CAD with 80% LM stenosis warranting CABG. His echo showed EF 45%, mild-moderate AI with mild dilation of aortic root. Intra-op TEE confirmed moderate AI with mild dilation of aortic root. He underwent CABG x3 with LIMA to LAD, reverse saphenous vein graft to obtuse marginal, reverse saphenous vein graft to first diagonal, AVR with 27 mm Inspiris Resilia tissue valve. Post-op echo showed EF 45-50% with no perivalvular leak. Post-hospital course was notable for nausea and NSR with bigeminy but otherwise relatively uncomplicated - did have post-op ABL anemia/thrombocytosis. Discharge Hgb 7.8, plt 122. He took a post-hospital course of Lasix/KCl x 5 days.  He is seen for follow-up back today overall feeling like he is doing well. He does note generalized fatigue but is feeling more like herself every day. No CP, dyspnea, edema, orthopnea or significant palpitations. He is noted to have soft BP and mild tachycardia today. Today his BP was 100/70 by MA, rechecked by me 87/70 on the left and 90/72 on the right. He is also mildly tachycardic as he was prior to discharge. Notes outline he had exertional tachycardia to the 130s. Resting  pulse is right around 100 today. He does feel slightly dizzy if he's in the shower and closes his eyes but otherwise no significant dizziness, pre-syncope or syncope. He is not dizzy or SOB today. No fever or chills. He is not SOB or hypoxic. His wife notes his pre-surgery BP historically ran 1teens/70s.   He saw Dr. Kipp Brood on 12/2 and BP was 120/83, HR 110, felt overall to be doing well. He has dressed up as [place] for the community in the past.   Labwork independently reviewed: 03/2021 K 4.0, Cr 0.86, Hgb 7.8, plt 122, Mg 2.1, LDL 53, trig 119, A1c wnl 12/2020 LFTs wnl   Past Medical History:  Diagnosis Date   Acute ST elevation myocardial infarction (STEMI) involving left anterior descending (LAD) coronary artery (HCC)    Adenomatous colon polyp 2000   Arthritis    Coronary artery disease involving native coronary artery of native heart without angina pectoris 03/23/2016   Diverticulosis    Erectile dysfunction    History of stroke 01/23/2017   Hypercholesteremia    Hypertension    Internal hemorrhoids    Kidney stones    Myocardial infarction [facility])    Occipital cerebral infarction ([facility]) 03/23/2016   Splenomegaly     Past Surgical History:  Procedure Laterality Date   AORTIC VALVE REPLACEMENT N/A 04/15/2021   Procedure: AORTIC VALVE REPLACEMENT (AVR) USING 27 MM INSPIRIS RESILIA  AORTIC VALVE;  Surgeon: Lajuana Matte, MD;  Location: [place];  Service: Open Heart Surgery;  Laterality: N/A;   CARDIAC CATHETERIZATION N/A 01/20/2016  Procedure: Left Heart Cath and Coronary Angiography;  Surgeon: Burnell Blanks, MD; LAD 100%, D1 100%, multi-vessel dz 10-30% in other vessels, EF 25-35%, elevated LVEDP   CARDIAC CATHETERIZATION N/A 01/20/2016   Procedure: Coronary Stent Intervention;  Surgeon: Burnell Blanks, MD; PROMUS PREM MR 3.5X16 mm DES LAD, D1 jailed by the stent but was already occluded   CHOLECYSTECTOMY  2009   COLONOSCOPY     CORONARY ARTERY BYPASS  GRAFT N/A 04/15/2021   Procedure: CORONARY ARTERY BYPASS GRAFTING (CABG) x 3 ON CARDIOPULMONARY BYPASS;  Surgeon: Lajuana Matte, MD;  Location: [place];  Service: Open Heart Surgery;  Laterality: N/A;   Flow trac   ENDOVEIN HARVEST OF GREATER SAPHENOUS VEIN Right 04/15/2021   Procedure: ENDOVEIN HARVEST OF GREATER SAPHENOUS VEIN;  Surgeon: Lajuana Matte, MD;  Location: [place];  Service: Open Heart Surgery;  Laterality: Right;   EYE SURGERY     Age 79   LEFT HEART CATH AND CORONARY ANGIOGRAPHY N/A 04/13/2021   Procedure: LEFT HEART CATH AND CORONARY ANGIOGRAPHY;  Surgeon: Troy Sine, MD;  Location: [place] [facility];  Service: Cardiovascular;  Laterality: N/A;   [place]  2008   SHOULDER SURGERY  1998   Left shoulder fracture-clavicle   WRIST FRACTURE SURGERY  1995   Bilateral    Current Medications: Current Meds  Medication Sig   aspirin EC 81 MG tablet Take 81 mg by mouth 2 (two) times daily. Swallow whole.   lisinopril (ZESTRIL) 5 MG tablet Take 5 mg by mouth daily.   Metoprolol Tartrate 37.5 MG TABS Take 37.5 mg by mouth 2 (two) times daily.   Multiple Vitamin (MULTIVITAMIN WITH MINERALS) TABS tablet Take 1 tablet by mouth daily.   nitroGLYCERIN (NITROSTAT) 0.4 MG SL tablet 1 tablet PRN CP   ondansetron (ZOFRAN) 4 MG tablet Take 4 mg by mouth every 8 (eight) hours as needed for nausea/vomiting, nausea or vomiting.   oxyCODONE (OXY IR/ROXICODONE) 5 MG immediate release tablet Take 1 tablet (5 mg total) by mouth every 6 (six) hours as needed.   rosuvastatin (CRESTOR) 40 MG tablet Take 40 mg by mouth daily.   terbinafine (LAMISIL) 250 MG tablet Take 1 tablet (250 mg total) by mouth daily.   tiZANidine (ZANAFLEX) 2 MG tablet Take 4 mg by mouth every 8 (eight) hours as needed for muscle spasms.   [DISCONTINUED] furosemide (LASIX) 40 MG tablet Take 1 tablet (40 mg total) by mouth daily. For 5 days then stop.   [DISCONTINUED] potassium  chloride SA (KLOR-CON) 20 MEQ tablet Take 1 tablet (20 mEq total) by mouth daily. For 5 days then stop.     Allergies:   Codeine and Lipitor [atorvastatin]   Social History   Socioeconomic History   Marital status: Married    Spouse name: Not on file   Number of children: 1   Years of education: Not on file   Highest education level: Not on file  Occupational History   Occupation: Research Tech  Tobacco Use   Smoking status: Never   Smokeless tobacco: Never  Vaping Use   Vaping Use: Never used  Substance and Sexual Activity   Alcohol use: Yes    Alcohol/week: 0.0 standard drinks    Comment: 2+ per week   Drug use: No   Sexual activity: Not on file  Other Topics Concern   Not on file  Social History Narrative   Not on file  Social Determinants of Health   Financial Resource Strain: Not on file  Food Insecurity: Not on file  Transportation Needs: Not on file  Physical Activity: Not on file  Stress: Not on file  Social Connections: Not on file     Family History:  The patient's family history includes CVA in his brother, brother, brother, and father; Heart disease in his father. There is no history of Colon polyps, Colon cancer, Esophageal cancer, Stomach cancer, Pancreatic cancer, or Liver disease.  ROS:   Please see the history of present illness.  All other systems are reviewed and otherwise negative.    EKGs/Labs/Other Studies Reviewed:    Studies reviewed are outlined and summarized above. Reports included below if pertinent.  TEE 04/15/21 POST-OP IMPRESSIONS  Overall, there were no significant changes from pre-bypass.  _ Left Ventricle: The left ventricle is unchanged from pre-bypass.  _ Right Ventricle: The right ventricle appears unchanged from pre-bypass.  _ Aorta: The aorta appears unchanged from pre-bypass.  _ Left Atrial Appendage: The left atrial appendage appears unchanged from  pre-bypass.  _ Aortic Valve: A bioprosthetic bioprosthetic valve  was placed, leaflets  are  freely mobile Manufactured by; a Size; 22mm. No regurgitation post repair.  The  gradient recorded across the prosthetic valve is within the expected  range. No  perivalvular leak noted.  _ Mitral Valve: The mitral valve appears unchanged from pre-bypass.  _ Tricuspid Valve: The tricuspid valve appears unchanged from pre-bypass.  _ Pulmonic Valve: The pulmonic valve appears unchanged from pre-bypass.  _ Interatrial Septum: The interatrial septum appears unchanged from  pre-bypass.  _ Comments: EF as before, 45-50%  new aortic valve with all leaflets moving well.   Cath 04/13/21   Ost LM to Dist LM lesion is 80% stenosed.   Prox LAD to Mid LAD lesion is 5% stenosed.   Prox LAD lesion is 30% stenosed.   1st Diag lesion is 90% stenosed.   Ost Cx to Prox Cx lesion is 5% stenosed.   2nd Mrg lesion is 15% stenosed.   Prox RCA lesion is 10% stenosed.   Since the 2017 acute catheterization there is now severe 80% eccentric left main stenosis with calcification superiorly.   The proximal LAD stent is patent and the previously occluded jailed diagonal vessel is now a diminutive vessel.  There is 30% stenosis proximal to the stent and mild smooth 20% narrowing beyond the stented segment.    Non-obstructive mild circumflex and RCA stenosis with a dominant RCA and septal collateralization to the LAD.   EF by echo today was 45%.  LVEDP is increased at 28 mm.   RECOMMENDATION: Will heparinize 8 hours post TR band removal.  Will initiate nitrates and continue carvedilol.  Surgical consultation for CABG revascularization.  2D echo 04/13/21  1. Left ventricular ejection fraction, by estimation, is 45%. Left  ventricular ejection fraction by 2D MOD biplane is 45.2 %. The left  ventricle has mildly decreased function. The left ventricle demonstrates  regional wall motion abnormalities (Apical  hypokinesis without LV thrombus). Left ventricular diastolic parameters  are  indeterminate.   2. Right ventricular systolic function is normal. The right ventricular  size is normal. Tricuspid regurgitation signal is inadequate for assessing  PA pressure.   3. The mitral valve is grossly normal. Trivial mitral valve  regurgitation.   4. The aortic valve is tricuspid. Aortic valve regurgitation is mild to  moderate. No aortic stenosis is present.   5. Aortic dilatation noted. There  is mild dilatation of the aortic root,  measuring 43 mm.   6. The inferior vena cava is normal in size with greater than 50%  respiratory variability, suggesting right atrial pressure of 3 mmHg.   Comparison(s): A prior study was performed on 04/20/2016. Aortic root has  increased in size.   03/2016 2D echo  - Left ventricle: The cavity size was normal. Wall thickness was    normal. Systolic function was mildly to moderately reduced. The    estimated ejection fraction was in the range of 40% to 45%.    Diffuse hypokinesis. Doppler parameters are consistent with    abnormal left ventricular relaxation (grade 1 diastolic    dysfunction).  - Aortic valve: Trileaflet; mildly thickened, mildly calcified    leaflets. There was trivial regurgitation.  - Mitral valve: There was mild regurgitation.   Impressions:  - Mildly improved EF (was 30%) when compared to prior study.    EKG:  EKG is ordered today, personally reviewed, demonstrating sinus tach 102bpm first degree AVB, nonspecific diffuse TW changes I, II, avL, V4-V6  Recent Labs: 01/26/2021: ALT 23 04/16/2021: Magnesium 2.1 05/04/2021: BUN WILL FOLLOW; Creatinine, Ser WILL FOLLOW; Hemoglobin 11.2; Platelets 372; Potassium WILL FOLLOW; Sodium WILL FOLLOW  Recent Lipid Panel    Component Value Date/Time   CHOL 109 04/13/2021 0342   CHOL 115 04/04/2018 0838   TRIG 119 04/13/2021 0342   HDL 32 (L) 04/13/2021 0342   HDL 41 04/04/2018 0838   CHOLHDL 3.4 04/13/2021 0342   VLDL 24 04/13/2021 0342   LDLCALC 53 04/13/2021 0342    LDLCALC 53 04/04/2018 0838    PHYSICAL EXAM:    VS:  BP 100/70 (BP Location: Left Arm, Patient Position: Sitting, Cuff Size: Normal)   Pulse (!) 102   Ht 5\' 10"  (1.778 m)   Wt 216 lb (98 kg)   SpO2 95%   BMI 30.99 kg/m   BMI: Body mass index is 30.99 kg/m.  GEN: Well nourished, well developed male in no acute distress HEENT: normocephalic, atraumatic Neck: no JVD, carotid bruits, or masses Cardiac: RRR with mild tachycardia; crisp valve sounds without significant murmurs, rubs, or gallops, no edema  Respiratory:  clear to auscultation bilaterally, normal work of breathing GI: soft, nontender, nondistended, + BS MS: no deformity or atrophy Skin: warm and dry, no rash Neuro:  Alert and Oriented x 3, Strength and sensation are intact, follows commands Psych: euthymic mood, full affect  Wt Readings from Last 3 Encounters:  05/04/21 216 lb (98 kg)  04/30/21 217 lb (98.4 kg)  04/20/21 225 lb 5 oz (102.2 kg)     ASSESSMENT & PLAN:   1. CAD with prior PCI, recent CABG with HLD goal LDL<70, here with sinus tachycardia and hypotension - clinically the patient is feeling well and appears well, non-toxic appearing, steady gait, no significant murmurs or clinical exam findings aside from soft BP and mild tachycardia. EKG shows sinus tach 102bpm first degree AVB, nonspecific diffuse TW changes I, II, avL, V4-V6. I discussed this case with Dr. Gasper Sells - will plan to check labs today to include CBC, BMET, TSH and obtain 2V CXR. He is not having any chest pain, dyspnea or symptoms of heart failure to suggest acute graft failure or valvular dysfunction. We will stop lisinopril, continue metoprolol at present dose, and plan early follow-up - will move up next OV to 12/22 with Dr. Gasper Sells. Return precautions reviewed. He also was encouraged to take the dose of aspirin  prescribed at recent discharge of 325mg  daily - was doing 2 81mg  tablets daily.  2. AI s/p recent bioprosthetic AVR/dilated  aortic root - f/u echo planned 05/28/21; will keep this per Dr. Gasper Sells. SBE prophylaxis related on AVS but will remind patient again when calling for labs as well as we did not go into detail during the [place]. He will need serial monitoring of his dilated aortic root; consider CTA when post-op issues settle down.  3. Ischemic cardiomyopathy/chronic combined CHF - clinically appears euvolemic. No edema on exam and lungs relatively clear. Hold lisinopril as above. Continue metoprolol as we are able. Not a candidate for aggressive GDMT otherwise due to hypotension at this time.  4. Essential HTN with hypotension - managed in context above  5. Mild carotid disease - near-normal by last carotid duplex. Follow clinically; will defer to primary cardiologist for timing of further surveillance if necessary    Cardiac Rehabilitation Eligibility Assessment  The patient is NOT ready to start cardiac rehabilitation due to: Other (Soft BP, tachycardia, also needs to be cleared by CVTS)    Disposition: F/u with Dr. Gasper Sells on 05/20/21.   Medication Adjustments/Labs and Tests Ordered: Current medicines are reviewed at length with the patient today.  Concerns regarding medicines are outlined above. Medication changes, Labs and Tests ordered today are summarized above and listed in the Patient Instructions accessible in Encounters.   Signed, Charlie Pitter, PA-C  05/04/2021 4:39 PM    [place] Phone: [phone]; Fax: [phone]

## 2021-05-04 ENCOUNTER — Encounter: Payer: Self-pay | Admitting: Physician Assistant

## 2021-05-04 ENCOUNTER — Ambulatory Visit
Admission: RE | Admit: 2021-05-04 | Discharge: 2021-05-04 | Disposition: A | Discharge status: Home / Self Care | Payer: PPO | Source: Ambulatory Visit | Attending: Physician Assistant | Admitting: Physician Assistant

## 2021-05-04 ENCOUNTER — Ambulatory Visit: Payer: PPO | Admitting: Physician Assistant

## 2021-05-04 ENCOUNTER — Other Ambulatory Visit: Payer: Self-pay

## 2021-05-04 VITALS — BP 100/70 | HR 102 | Ht 70.0 in | Wt 216.0 lb

## 2021-05-04 DIAGNOSIS — R000 Tachycardia, unspecified: Secondary | ICD-10-CM

## 2021-05-04 DIAGNOSIS — I2510 Atherosclerotic heart disease of native coronary artery without angina pectoris: Secondary | ICD-10-CM | POA: Diagnosis not present

## 2021-05-04 DIAGNOSIS — I779 Disorder of arteries and arterioles, unspecified: Secondary | ICD-10-CM

## 2021-05-04 DIAGNOSIS — I10 Essential (primary) hypertension: Secondary | ICD-10-CM

## 2021-05-04 DIAGNOSIS — I351 Nonrheumatic aortic (valve) insufficiency: Secondary | ICD-10-CM | POA: Diagnosis not present

## 2021-05-04 DIAGNOSIS — Z952 Presence of prosthetic heart valve: Secondary | ICD-10-CM | POA: Diagnosis not present

## 2021-05-04 DIAGNOSIS — I5042 Chronic combined systolic (congestive) and diastolic (congestive) heart failure: Secondary | ICD-10-CM | POA: Diagnosis not present

## 2021-05-04 DIAGNOSIS — J90 Pleural effusion, not elsewhere classified: Secondary | ICD-10-CM | POA: Diagnosis not present

## 2021-05-04 DIAGNOSIS — R918 Other nonspecific abnormal finding of lung field: Secondary | ICD-10-CM | POA: Diagnosis not present

## 2021-05-04 DIAGNOSIS — Z951 Presence of aortocoronary bypass graft: Secondary | ICD-10-CM | POA: Diagnosis not present

## 2021-05-04 DIAGNOSIS — E785 Hyperlipidemia, unspecified: Secondary | ICD-10-CM | POA: Diagnosis not present

## 2021-05-04 LAB — CBC
Hematocrit: 33.7 % — ABNORMAL LOW (ref 37.5–51.0)
Hemoglobin: 11.2 g/dL — ABNORMAL LOW (ref 13.0–17.7)
MCH: 31.3 pg (ref 26.6–33.0)
MCHC: 33.2 g/dL (ref 31.5–35.7)
MCV: 94 fL (ref 79–97)
Platelets: 372 10*3/uL (ref 150–450)
RBC: 3.58 x10E6/uL — ABNORMAL LOW (ref 4.14–5.80)
RDW: 15 % (ref 11.6–15.4)
WBC: 7.7 10*3/uL (ref 3.4–10.8)

## 2021-05-04 LAB — BASIC METABOLIC PANEL
BUN/Creatinine Ratio: 15 (ref 10–24)
BUN: 13 mg/dL (ref 8–27)
CO2: 26 mmol/L (ref 20–29)
Calcium: 8.9 mg/dL (ref 8.6–10.2)
Chloride: 101 mmol/L (ref 96–106)
Creatinine, Ser: 0.86 mg/dL (ref 0.76–1.27)
Glucose: 110 mg/dL — ABNORMAL HIGH (ref 70–99)
Potassium: 4.2 mmol/L (ref 3.5–5.2)
Sodium: 138 mmol/L (ref 134–144)
eGFR: 95 mL/min/{1.73_m2} (ref >59)

## 2021-05-04 MED ORDER — ASPIRIN EC 325 MG PO TBEC: 325.0000 mg | DELAYED_RELEASE_TABLET | Freq: Every day | ORAL | 0 refills | Status: DC

## 2021-05-04 NOTE — Patient Instructions (Addendum)
Medication Instructions:  Your physician has recommended you make the following change in your medication:   STOP Aspirin 81  START Aspirin 325 mg taking 1 daly  STOP Lisinopril    *If you need a refill on your cardiac medications before your next appointment, please call your pharmacy*   Lab Work: TODAY:  STAT CBC. BMET, & TSH   If you have labs (blood work) drawn today and your tests are completely normal, you will receive your results only by: [place] (if you have MyChart) OR A paper copy in the mail If you have any lab test that is abnormal or we need to change your treatment, we will call you to review the results.   Testing/Procedures: A chest x-ray takes a picture of the organs and structures inside the chest, including the heart, lungs, and blood vessels. This test can show several things, including, whether the heart is enlarges; whether fluid is building up in the lungs; and whether pacemaker / defibrillator leads are still in place. Go to [place], before 4:30  [place], [place] [address] [phone]   Follow-Up: At [facility], you and your health needs are our priority.  As part of our continuing mission to provide you with exceptional heart care, we have created designated Provider Care Teams.  These Care Teams include your primary Cardiologist (physician) and Advanced Practice Providers (APPs -  Physician Assistants and Nurse Practitioners) who all work together to provide you with the care you need, when you need it.  We recommend signing up for the patient portal called "MyChart".  Sign up information is provided on this After Visit Summary.  MyChart is used to connect with patients for Virtual Visits (Telemedicine).  Patients are able to view lab/test results, encounter notes, upcoming appointments, etc.  Non-urgent messages can be sent to your provider as well.   To learn more about what you can do with MyChart, go  to [email or web address].    Your next appointment:   05/20/21 arrive at 7:45  The format for your next appointment:   In Person  Provider:   Werner Lean, MD     Other Instructions  Endocarditis Information  You may be at risk for developing endocarditis since you have an artificial heart valve or a repaired heart valve. Endocarditis is an infection of the lining of the heart or heart valves. Certain surgical and dental procedures may put you at risk, such as teeth cleaning or other dental procedures or other medical procedures. Notify our office or your dentist before having any dental work or invasive/surgical procedures. You will need to take antibiotics before certain procedures. To prevent endocarditis, maintain good oral health. Seek prompt medical attention for any mouth/gum, skin or urinary tract infections.

## 2021-05-05 ENCOUNTER — Telehealth: Payer: Self-pay | Admitting: Internal Medicine

## 2021-05-05 LAB — TSH: TSH: 2.1 u[IU]/mL (ref 0.450–4.500)

## 2021-05-05 NOTE — Progress Notes (Signed)
Pt has been made aware of normal result and verbalized understanding.  jw

## 2021-05-05 NOTE — Telephone Encounter (Signed)
Call pt to discuss his Chest X-Ray results and he has found the Metoprolol.

## 2021-05-05 NOTE — Telephone Encounter (Signed)
Called pt in regards to metoprolol tartrate.  Pt reports he does not have this medication.  I advised pt that a script was sent once discharged from ED.  Pt has not checked with pharmacy to see if med is ready.  I advised pt to call pharmacy if med script is not available to call back and I will ensure script is sent in.  Pt expresses taking carvedilol I advised him that med was stopped at discharge from ED. I advised pt to stop taking carvedilol and start metoprolol 37.5 mg PO BID.  Pt will call back if further assistance is needed.

## 2021-05-05 NOTE — Telephone Encounter (Signed)
Pt c/o medication issue:  1. Name of Medication: Metoprolol Tartrate 37.5 MG TABS  2. How are you currently taking this medication (dosage and times per day)? Take 37.5 mg by mouth 2 (two) times daily  3. Are you having a reaction (difficulty breathing--STAT)? no  4. What is your medication issue? Patient was calling to get more info on this. He states that he doesn't have a prescription for this. Needing to if he is supposed to be still taking this. Looks like it was given to him in the hospital.  .

## 2021-05-12 ENCOUNTER — Other Ambulatory Visit: Payer: Self-pay | Admitting: Physician Assistant

## 2021-05-17 NOTE — Progress Notes (Signed)
Cardiology Office Note:    Date:  05/20/2021   ID:  Dwayne [name], DOB [birth year], MRN [id number]  PCP:  Gaynelle Arabian, MD  Pronunciation: Per Mar'   [facility] Providers Cardiologist:  Werner Lean, MD     Referring MD: Gaynelle Arabian, MD   CC:  follow up CABG  History of Present Illness:    Dwayne [name] is a 65 y.o. male with a hx of CAD s/p prior PCI (LAD STEMI 2017), Ischemic Cardiomyopathy EF 25-30%-> 40-45%, Prior CVA in 2017 with mild CAS, HTN and HLD seen 03/17/21.  Had hip surgery without issue.  I have seen the patient in interval in the hospital.  CP and found to have multivessel CAD with 80% LM stenosis warranting CABG. His echo showed EF 45%, mild-moderate AI with mild dilation of aortic root. Intra-op TEE confirmed moderate AI with mild dilation of aortic root. He underwent CABG x3 with LIMA to LAD, reverse saphenous vein graft to obtuse marginal, reverse saphenous vein graft to first diagonal, AVR with 27 mm Inspiris Resilia tissue valve. Anemia (stable) at discharge.  Found to have bigeminy at that time.  I discussed his care with Ms. Idolina Primer 05/04/21 with resting sinus tachycardia.  CXR WNL.  Seen 05/20/21.  Patient notes that he is doing good.  Hanging around the house and moving things with no pain.  Still have some sternal pain. Continues to improve.   There are no interval hospital/ED visit.    No chest pain or pressure .  No SOB/DOE and no PND/Orthopnea.  No weight gain or leg swelling.  No palpitations or syncope. Ambulatory blood pressure is elevated 1300/90    Past Medical History:  Diagnosis Date   Acute ST elevation myocardial infarction (STEMI) involving left anterior descending (LAD) coronary artery (HCC)    Adenomatous colon polyp 2000   Aortic insufficiency    Arthritis    Carotid stenosis    Coronary artery disease involving native coronary artery of native heart without angina pectoris 03/23/2016   Dilated aortic root (HCC)     Diverticulosis    Erectile dysfunction    History of stroke 01/23/2017   Hypercholesteremia    Hypertension    Internal hemorrhoids    Kidney stones    Myocardial infarction [facility])    Occipital cerebral infarction ([place]) 03/23/2016   S/P AVR    Splenomegaly     Past Surgical History:  Procedure Laterality Date   AORTIC VALVE REPLACEMENT N/A 04/15/2021   Procedure: AORTIC VALVE REPLACEMENT (AVR) USING 27 MM INSPIRIS RESILIA  AORTIC VALVE;  Surgeon: Lajuana Matte, MD;  Location: [place];  Service: Open Heart Surgery;  Laterality: N/A;   CARDIAC CATHETERIZATION N/A 01/20/2016   Procedure: Left Heart Cath and Coronary Angiography;  Surgeon: Burnell Blanks, MD; LAD 100%, D1 100%, multi-vessel dz 10-30% in other vessels, EF 25-35%, elevated LVEDP   CARDIAC CATHETERIZATION N/A 01/20/2016   Procedure: Coronary Stent Intervention;  Surgeon: Burnell Blanks, MD; PROMUS PREM MR 3.5X16 mm DES LAD, D1 jailed by the stent but was already occluded   CHOLECYSTECTOMY  2009   COLONOSCOPY     CORONARY ARTERY BYPASS GRAFT N/A 04/15/2021   Procedure: CORONARY ARTERY BYPASS GRAFTING (CABG) x 3 ON CARDIOPULMONARY BYPASS;  Surgeon: Lajuana Matte, MD;  Location: [place];  Service: Open Heart Surgery;  Laterality: N/A;   Flow trac   ENDOVEIN HARVEST OF GREATER SAPHENOUS VEIN Right 04/15/2021   Procedure: ENDOVEIN HARVEST OF GREATER  SAPHENOUS VEIN;  Surgeon: Lajuana Matte, MD;  Location: [place];  Service: Open Heart Surgery;  Laterality: Right;   EYE SURGERY     Age 61   LEFT HEART CATH AND CORONARY ANGIOGRAPHY N/A 04/13/2021   Procedure: LEFT HEART CATH AND CORONARY ANGIOGRAPHY;  Surgeon: Troy Sine, MD;  Location: [place] [facility];  Service: Cardiovascular;  Laterality: N/A;   [place]  2008   SHOULDER SURGERY  1998   Left shoulder fracture-clavicle   WRIST FRACTURE SURGERY  1995   Bilateral    Current Medications: Current Meds   Medication Sig   aspirin EC 325 MG tablet Take 1 tablet (325 mg total) by mouth daily.   lisinopril (ZESTRIL) 5 MG tablet Take 1 tablet (5 mg total) by mouth daily.   metoprolol succinate (TOPROL XL) 50 MG 24 hr tablet Take 1.5 tablets (75 mg total) by mouth daily. Take with or immediately following a meal.   Multiple Vitamin (MULTIVITAMIN WITH MINERALS) TABS tablet Take 1 tablet by mouth daily.   nitroGLYCERIN (NITROSTAT) 0.4 MG SL tablet 1 tablet PRN CP   ondansetron (ZOFRAN) 4 MG tablet Take 4 mg by mouth every 8 (eight) hours as needed for nausea/vomiting, nausea or vomiting.   oxyCODONE (OXY IR/ROXICODONE) 5 MG immediate release tablet Take 1 tablet (5 mg total) by mouth every 6 (six) hours as needed.   rosuvastatin (CRESTOR) 40 MG tablet Take 40 mg by mouth daily.   terbinafine (LAMISIL) 250 MG tablet Take 1 tablet (250 mg total) by mouth daily.   tiZANidine (ZANAFLEX) 2 MG tablet Take 4 mg by mouth every 8 (eight) hours as needed for muscle spasms.   [DISCONTINUED] Metoprolol Tartrate 37.5 MG TABS Take 37.5 mg by mouth 2 (two) times daily.     Allergies:   Codeine and Lipitor [atorvastatin]   Social History   Socioeconomic History   Marital status: Married    Spouse name: Not on file   Number of children: 1   Years of education: Not on file   Highest education level: Not on file  Occupational History   Occupation: Research Tech  Tobacco Use   Smoking status: Never   Smokeless tobacco: Never  Vaping Use   Vaping Use: Never used  Substance and Sexual Activity   Alcohol use: Yes    Alcohol/week: 0.0 standard drinks    Comment: 2+ per week   Drug use: No   Sexual activity: Not on file  Other Topics Concern   Not on file  Social History Narrative   Not on file   Social Determinants of Health   Financial Resource Strain: Not on file  Food Insecurity: Not on file  Transportation Needs: Not on file  Physical Activity: Not on file  Stress: Not on file  Social  Connections: Not on file    Social: History of playing [place], Born at [facility]  Family History: The patient's family history includes CVA in his brother, brother, brother, and father; Heart disease in his father. There is no history of Colon polyps, Colon cancer, Esophageal cancer, Stomach cancer, Pancreatic cancer, or Liver disease.  ROS:   Please see the history of present illness.     All other systems reviewed and are negative.  EKGs/Labs/Other Studies Reviewed:    The following studies were reviewed today:  EKG:   03/16/21: SR 1st heart block   Transthoracic Echocardiogram: Date: 04/20/2016 Results: Study Conclusions   -  Left ventricle: The cavity size was normal. Wall thickness was    normal. Systolic function was mildly to moderately reduced. The    estimated ejection fraction was in the range of 40% to 45%.    Diffuse hypokinesis. Doppler parameters are consistent with    abnormal left ventricular relaxation (grade 1 diastolic    dysfunction).  - Aortic valve: Trileaflet; mildly thickened, mildly calcified    leaflets. There was trivial regurgitation.  - Mitral valve: There was mild regurgitation.   Carotid Vascular Duplex: Date: 01/23/2016 Results: Bilateral: mild mixed plaque origin ICA. Mild soft plaque proximal  ICA and ECA. Vertebral artery flow is antegrade. 1-39% ICA  plaquing. Left ECA velocities are elevated.   Left/Right Heart Catheterizations: Date: 01/20/2016 Results: Mid RCA lesion, 10 %stenosed. Ost 1st Mrg to 1st Mrg lesion, 20 %stenosed. 2nd Mrg lesion, 20 %stenosed. Mid Cx lesion, 10 %stenosed. Prox Cx to Mid Cx lesion, 10 %stenosed. Ost LM to LM lesion, 30 %stenosed. Ost 1st Diag to 1st Diag lesion, 100 %stenosed. A STENT PROMUS PREM MR 3.5X16 drug eluting stent was successfully placed. Prox LAD lesion, 100 %stenosed. Post intervention, there is a 0% residual stenosis. Mid LAD lesion, 20 %stenosed. There is moderate left  ventricular systolic dysfunction. LV end diastolic pressure is mildly elevated. The left ventricular ejection fraction is 25-35% by visual estimate. There is no mitral valve regurgitation.   1. Acute anterior STEMI secondary to occluded proximal LAD 2. Successful PTCA/DES x 1 proximal to mid LAD 3. Mild disease in the left main, RCA and Circumflex 4. Moderate segmental LV systolic dysfunction   Recommendations: Will continue DAPT for one year with ASA and Brilinta. Will start beta blocker and change statin to Crestor. Will continue Ace-inh. Echo in before discharge.   Recent Labs: 01/26/2021: ALT 23 04/16/2021: Magnesium 2.1 05/04/2021: BUN 13; Creatinine, Ser 0.86; Hemoglobin 11.2; Platelets 372; Potassium 4.2; Sodium 138; TSH 2.100  Recent Lipid Panel    Component Value Date/Time   CHOL 109 04/13/2021 0342   CHOL 115 04/04/2018 0838   TRIG 119 04/13/2021 0342   HDL 32 (L) 04/13/2021 0342   HDL 41 04/04/2018 0838   CHOLHDL 3.4 04/13/2021 0342   VLDL 24 04/13/2021 0342   LDLCALC 53 04/13/2021 0342   LDLCALC 53 04/04/2018 0838    Physical Exam:    VS:  BP 130/90    Pulse 86    Ht 5\' 10"  (1.778 m)    Wt 97.5 kg    SpO2 97%    BMI 30.85 kg/m     Wt Readings from Last 3 Encounters:  05/20/21 97.5 kg  05/04/21 98 kg  04/30/21 98.4 kg     Gen: no distress   Neck: No JVD Ears: Bilateral  Pilar Plate Sign Cardiac: No Rubs or Gallops, no Murmur, regular, +2 radial pulses Respiratory: Clear to auscultation bilaterally, normal effort, normal  respiratory rate GI: Soft, nontender, non-distended  MS: No  edema;  moves all extremities Integument: Skin feels warm, CABG scar c/d/i Neuro:  At time of evaluation, alert and oriented to person/place/time/situation  Psych: Normal affect, patient feels    ASSESSMENT:    1. CAD in native artery   2. Essential hypertension   3. HFrEF (heart failure with reduced ejection fraction) (HCC)     PLAN:     CAD with 2022 LIMA to LAD, saphenous  vein graft to OM, SVG D1 to first diagonal 27 mm Inspiris after Moderate AI HFrEF and HTN CAS HLD hx  of 2017 CVA Mild TAA 41 mm at 11/22 - NYHA class I, Stage B, euvolemic, etiology from ischemia and may have recovered - increase to succinate (75 mg PO daily) - add back ACEi - ASA and driving per CT surgery, suspect at next visit change to 81 mg PO daily - No PRN nitro needed - dental ppx (Amoxicillin) - rosuvastatin 40, LDL goal < 55 (was at goal 04/13/21 - at next visit will consider MRA vs SGLT2i - BMP in 2 weeks  Three months me or Dayna   Medication Adjustments/Labs and Tests Ordered: Current medicines are reviewed at length with the patient today.  Concerns regarding medicines are outlined above.  Orders Placed This Encounter  Procedures   Basic metabolic panel    Meds ordered this encounter  Medications   lisinopril (ZESTRIL) 5 MG tablet    Sig: Take 1 tablet (5 mg total) by mouth daily.    Dispense:  90 tablet    Refill:  3   metoprolol succinate (TOPROL XL) 50 MG 24 hr tablet    Sig: Take 1.5 tablets (75 mg total) by mouth daily. Take with or immediately following a meal.    Dispense:  135 tablet    Refill:  3     Patient Instructions  Medication Instructions:  Your physician has recommended you make the following change in your medication:  STOP: metoprolol tartrate  START: metoprolol succinate 75 mg by mouth once daily  START: lisinopril 5 mg by mouth once daily  *If you need a refill on your cardiac medications before your next appointment, please call your pharmacy*   Lab Work: IN 2 WEEKS: BMET  If you have labs (blood work) drawn today and your tests are completely normal, you will receive your results only by: [place] (if you have MyChart) OR A paper copy in the mail If you have any lab test that is abnormal or we need to change your treatment, we will call you to review the results.   Testing/Procedures: NONE   Follow-Up: At [facility], you and your health needs are our priority.  As part of our continuing mission to provide you with exceptional heart care, we have created designated Provider Care Teams.  These Care Teams include your primary Cardiologist (physician) and Advanced Practice Providers (APPs -  Physician Assistants and Nurse Practitioners) who all work together to provide you with the care you need, when you need it.  We recommend signing up for the patient portal called "MyChart".  Sign up information is provided on this After Visit Summary.  MyChart is used to connect with patients for Virtual Visits (Telemedicine).  Patients are able to view lab/test results, encounter notes, upcoming appointments, etc.  Non-urgent messages can be sent to your provider as well.   To learn more about what you can do with MyChart, go to [email or web address].    Your next appointment:   3 month(s)  The format for your next appointment:   In Person  Provider:   Werner Lean, MD  or Melina Copa, PA-C      {    Signed, Werner Lean, MD  05/20/2021 8:53 AM    [place]

## 2021-05-20 ENCOUNTER — Encounter: Payer: Self-pay | Admitting: Internal Medicine

## 2021-05-20 ENCOUNTER — Other Ambulatory Visit: Payer: Self-pay

## 2021-05-20 ENCOUNTER — Ambulatory Visit: Payer: PPO | Admitting: Internal Medicine

## 2021-05-20 VITALS — BP 130/90 | HR 86 | Ht 70.0 in | Wt 215.0 lb

## 2021-05-20 DIAGNOSIS — I10 Essential (primary) hypertension: Secondary | ICD-10-CM | POA: Diagnosis not present

## 2021-05-20 DIAGNOSIS — I5020 Unspecified systolic (congestive) heart failure: Secondary | ICD-10-CM | POA: Insufficient documentation

## 2021-05-20 DIAGNOSIS — I2510 Atherosclerotic heart disease of native coronary artery without angina pectoris: Secondary | ICD-10-CM

## 2021-05-20 MED ORDER — LISINOPRIL 5 MG PO TABS: 5.0000 mg | ORAL_TABLET | Freq: Every day | ORAL | 3 refills | Status: DC

## 2021-05-20 MED ORDER — METOPROLOL SUCCINATE ER 50 MG PO TB24: 75.0000 mg | ORAL_TABLET | Freq: Every day | ORAL | 3 refills | Status: DC

## 2021-05-20 NOTE — Patient Instructions (Addendum)
Medication Instructions:  Your physician has recommended you make the following change in your medication:  STOP: metoprolol tartrate  START: metoprolol succinate 75 mg by mouth once daily  START: lisinopril 5 mg by mouth once daily  *If you need a refill on your cardiac medications before your next appointment, please call your pharmacy*   Lab Work: IN 2 WEEKS: BMET  If you have labs (blood work) drawn today and your tests are completely normal, you will receive your results only by: [place] (if you have MyChart) OR A paper copy in the mail If you have any lab test that is abnormal or we need to change your treatment, we will call you to review the results.   Testing/Procedures: NONE   Follow-Up: At [facility], you and your health needs are our priority.  As part of our continuing mission to provide you with exceptional heart care, we have created designated Provider Care Teams.  These Care Teams include your primary Cardiologist (physician) and Advanced Practice Providers (APPs -  Physician Assistants and Nurse Practitioners) who all work together to provide you with the care you need, when you need it.  We recommend signing up for the patient portal called "MyChart".  Sign up information is provided on this After Visit Summary.  MyChart is used to connect with patients for Virtual Visits (Telemedicine).  Patients are able to view lab/test results, encounter notes, upcoming appointments, etc.  Non-urgent messages can be sent to your provider as well.   To learn more about what you can do with MyChart, go to [email or web address].    Your next appointment:   3 month(s)  The format for your next appointment:   In Person  Provider:   Werner Lean, MD  or Melina Copa, PA-C      {

## 2021-05-28 ENCOUNTER — Ambulatory Visit (HOSPITAL_COMMUNITY): Payer: PPO | Attending: Cardiovascular Disease

## 2021-05-28 ENCOUNTER — Other Ambulatory Visit: Payer: Self-pay

## 2021-05-28 DIAGNOSIS — I350 Nonrheumatic aortic (valve) stenosis: Secondary | ICD-10-CM | POA: Diagnosis not present

## 2021-05-28 LAB — ECHOCARDIOGRAM COMPLETE
Area-P 1/2: 3.24 cm2
S' Lateral: 4 cm

## 2021-06-01 ENCOUNTER — Ambulatory Visit: Payer: PPO | Admitting: Podiatry

## 2021-06-01 ENCOUNTER — Other Ambulatory Visit: Payer: Self-pay | Admitting: Thoracic Surgery (Cardiothoracic Vascular Surgery)

## 2021-06-01 ENCOUNTER — Other Ambulatory Visit: Payer: Self-pay

## 2021-06-01 ENCOUNTER — Encounter: Payer: Self-pay | Admitting: Podiatry

## 2021-06-01 DIAGNOSIS — L603 Nail dystrophy: Secondary | ICD-10-CM

## 2021-06-01 DIAGNOSIS — Z951 Presence of aortocoronary bypass graft: Secondary | ICD-10-CM

## 2021-06-01 MED ORDER — TERBINAFINE HCL 250 MG PO TABS: 250.0000 mg | ORAL_TABLET | Freq: Every day | ORAL | 0 refills | Status: DC

## 2021-06-01 NOTE — Patient Instructions (Signed)
Dr. Hyatt has sent over a refill for Lamisil to your pharmacy today. The instructions on your bottle will say "take 1 tablet daily", however, he would like for you to take one pill every other day. He will follow up with you in 3 months to re-evaluate your toenails. 

## 2021-06-01 NOTE — Progress Notes (Signed)
He presents today for follow-up of his nail fungus.  He denies fever chills nausea vomiting muscle aches pain states that he is doing just fine denies any problems taking medication.  No rashes are noted.  States that he is recovering from open heart surgery.  Objective: Vital signs are stable alert oriented x3.  Pulses are palpable.  Toenails are grown out by greater than 50% at this point.  Looking very good.  Assessment: Well-healing onychomycosis long-term therapy.  Plan: At this point he has completed his 120 days so we will start him back on 1 every other day.

## 2021-06-03 ENCOUNTER — Other Ambulatory Visit: Payer: PPO | Admitting: *Deleted

## 2021-06-03 ENCOUNTER — Other Ambulatory Visit: Payer: Self-pay

## 2021-06-03 DIAGNOSIS — I10 Essential (primary) hypertension: Secondary | ICD-10-CM | POA: Diagnosis not present

## 2021-06-03 DIAGNOSIS — I2510 Atherosclerotic heart disease of native coronary artery without angina pectoris: Secondary | ICD-10-CM

## 2021-06-03 LAB — BASIC METABOLIC PANEL
BUN/Creatinine Ratio: 10 (ref 10–24)
BUN: 8 mg/dL (ref 8–27)
CO2: 24 mmol/L (ref 20–29)
Calcium: 9.1 mg/dL (ref 8.6–10.2)
Chloride: 102 mmol/L (ref 96–106)
Creatinine, Ser: 0.78 mg/dL (ref 0.76–1.27)
Glucose: 114 mg/dL — ABNORMAL HIGH (ref 70–99)
Potassium: 4.3 mmol/L (ref 3.5–5.2)
Sodium: 138 mmol/L (ref 134–144)
eGFR: 98 mL/min/{1.73_m2} (ref >59)

## 2021-06-08 ENCOUNTER — Other Ambulatory Visit: Payer: Self-pay

## 2021-06-08 ENCOUNTER — Other Ambulatory Visit: Payer: Self-pay | Admitting: *Deleted

## 2021-06-08 ENCOUNTER — Ambulatory Visit (INDEPENDENT_AMBULATORY_CARE_PROVIDER_SITE_OTHER): Payer: Self-pay | Admitting: Surgical

## 2021-06-08 ENCOUNTER — Ambulatory Visit
Admission: RE | Admit: 2021-06-08 | Discharge: 2021-06-08 | Disposition: A | Discharge status: Home / Self Care | Payer: PPO | Source: Ambulatory Visit | Attending: Thoracic Surgery (Cardiothoracic Vascular Surgery) | Admitting: Thoracic Surgery (Cardiothoracic Vascular Surgery)

## 2021-06-08 VITALS — BP 141/93 | HR 93 | Resp 20 | Ht 70.0 in | Wt 217.0 lb

## 2021-06-08 DIAGNOSIS — Z951 Presence of aortocoronary bypass graft: Secondary | ICD-10-CM

## 2021-06-08 DIAGNOSIS — Z952 Presence of prosthetic heart valve: Secondary | ICD-10-CM | POA: Diagnosis not present

## 2021-06-08 DIAGNOSIS — Z953 Presence of xenogenic heart valve: Secondary | ICD-10-CM

## 2021-06-08 NOTE — Patient Instructions (Signed)
Discussed routine activity progression including driving and lifting.

## 2021-06-08 NOTE — Progress Notes (Signed)
[address]       [place] [address]             [phone]      Dwayne [name] [place] Medical Record #[id number] Date of Birth: 1956-05-10  Referring: Troy Sine, MD Primary Care: Gaynelle Arabian, MD Primary Cardiologist: Werner Lean, MD   Chief Complaint:   POST OP FOLLOW UP 04/15/2021 Patient:  Dwayne [name] Pre-Op Dx: Left main coronary artery disease                         Unstable angina                         History of previous PCI                         Moderate aortic regurgitation Post-op Dx: Same Procedure: CABG X 3.  LIMA to LAD, reverse saphenous vein graft to obtuse marginal, reverse saphenous vein graft to first diagonal. Endoscopic greater saphenous vein harvest on the right Aortic valve replacement 27 mm Inspiris Resilia valve   Surgeon and Role:      * Lightfoot, Lucile Crater, MD - Primary    *D. Tacy Dura, PA-C-provided assistance for vein harvesting as well as valve placement. History of Present Illness:    The patient is a 66 year old male status post the above described procedure seen in the office on today's date routine postsurgical follow-up.  He reports that he has had minimal pain postoperatively and none at this point.  He is tolerating routine activities and has started to drive.  He denies fevers, chills or other significant constitutional symptoms.  He did have some early constipation but this is resolved.  He is not having any chest pain, shortness of breath or palpitations.  He is not having any lower extremity edema.  Overall he was quite pleased with his progress.      Past Medical History:  Diagnosis Date   Acute ST elevation myocardial infarction (STEMI) involving left anterior descending (LAD) coronary artery (HCC)    Adenomatous colon polyp 2000   Aortic insufficiency    Arthritis    Carotid stenosis    Coronary artery disease involving native coronary artery of native heart without  angina pectoris 03/23/2016   Dilated aortic root (HCC)    Diverticulosis    Erectile dysfunction    History of stroke 01/23/2017   Hypercholesteremia    Hypertension    Internal hemorrhoids    Kidney stones    Myocardial infarction [facility])    Occipital cerebral infarction ([place]) 03/23/2016   S/P AVR    Splenomegaly      Social History   Tobacco Use  Smoking Status Never  Smokeless Tobacco Never    Social History   Substance and Sexual Activity  Alcohol Use Yes   Alcohol/week: 0.0 standard drinks   Comment: 2+ per week     Allergies  Allergen Reactions   Codeine Nausea Only   Lipitor [Atorvastatin] Other (See Comments)    Myalgia    Current Outpatient Medications  Medication Sig Dispense Refill   aspirin EC 325 MG tablet Take 1 tablet (325 mg total) by mouth daily. 30 tablet 0   lisinopril (ZESTRIL) 5 MG tablet Take 1 tablet (5 mg total) by mouth daily. 90 tablet 3   metoprolol succinate (TOPROL XL) 50  MG 24 hr tablet Take 1.5 tablets (75 mg total) by mouth daily. Take with or immediately following a meal. 135 tablet 3   Multiple Vitamin (MULTIVITAMIN WITH MINERALS) TABS tablet Take 1 tablet by mouth daily.     nitroGLYCERIN (NITROSTAT) 0.4 MG SL tablet 1 tablet PRN CP     ondansetron (ZOFRAN) 4 MG tablet Take 4 mg by mouth every 8 (eight) hours as needed for nausea/vomiting, nausea or vomiting.     oxyCODONE (OXY IR/ROXICODONE) 5 MG immediate release tablet Take 1 tablet (5 mg total) by mouth every 6 (six) hours as needed. 30 tablet 0   rosuvastatin (CRESTOR) 40 MG tablet Take 40 mg by mouth daily.     terbinafine (LAMISIL) 250 MG tablet Take 1 tablet (250 mg total) by mouth daily. 30 tablet 0   tiZANidine (ZANAFLEX) 2 MG tablet Take 4 mg by mouth every 8 (eight) hours as needed for muscle spasms.     No current facility-administered medications for this visit.       Physical Exam: Ht 5\' 10"  (1.778 m)    BMI 30.85 kg/m   General appearance: alert,  cooperative, and no distress Heart: regular rate and rhythm and no murmurs gallops or rubs Lungs: clear to auscultation bilaterally Abdomen: Benign exam Extremities: No edema Wound: Incisions healing well without evidence of infection   Diagnostic Studies & Laboratory data:     Recent Radiology Findings:   DG Chest 2 View  Result Date: 06/08/2021 CLINICAL DATA:  Follow-up CABG EXAM: CHEST - 2 VIEW COMPARISON:  05/04/2021 FINDINGS: Previous median sternotomy and CABG. Previous aortic valve replacement. Heart size is normal. Mediastinal shadows otherwise unremarkable. The lungs are clear. The vascularity is normal. Resolution of previously seen left effusion. No acute bone finding. IMPRESSION: Previous CABG and AVR.  No active disease presently. Electronically Signed   By: Nelson Chimes M.D.   On: 06/08/2021 09:47      Recent Lab Findings: Lab Results  Component Value Date   WBC 7.7 05/04/2021   HGB 11.2 (L) 05/04/2021   HCT 33.7 (L) 05/04/2021   PLT 372 05/04/2021   GLUCOSE 114 (H) 06/03/2021   CHOL 109 04/13/2021   TRIG 119 04/13/2021   HDL 32 (L) 04/13/2021   LDLCALC 53 04/13/2021   ALT 23 01/26/2021   AST 15 01/26/2021   NA 138 06/03/2021   K 4.3 06/03/2021   CL 102 06/03/2021   CREATININE 0.78 06/03/2021   BUN 8 06/03/2021   CO2 24 06/03/2021   TSH 2.100 05/04/2021   INR 1.4 (H) 04/15/2021   HGBA1C 4.9 04/12/2021      Assessment / Plan: Patient is doing quite well.  His chest x-ray was reviewed and there are no significant findings.  He is healing well in regards to his incisions.  Activity level has progressed in a routine manner.  He is okay to start cardiac rehab.  He was somewhat hypertensive on this visit but it is noted he was recently started on lisinopril.  Cardiology can continue to titrate his medications long-term.  From a surgical perspective he does not require further follow-up.  Should he have any changes that would require surgical management we are happy  to see him again but for now we will make it a as needed basis.      Medication Changes: No orders of the defined types were placed in this encounter.     John Giovanni, PA-C  06/08/2021 9:50 AM

## 2021-06-21 ENCOUNTER — Ambulatory Visit: Payer: PPO | Admitting: Internal Medicine

## 2021-06-23 ENCOUNTER — Telehealth (HOSPITAL_COMMUNITY): Payer: Self-pay

## 2021-06-23 NOTE — Telephone Encounter (Signed)
Pt insurance is active and benefits verified through [employer]. Co-pay $15.00, DED $0.00/$0.00 met, out of pocket $3,200.00/$0.00 met, co-insurance 0%. No pre-authorization required. Rasmeet/[employer], 06/22/21 @ 10:24AM, EEA#[id number]

## 2021-06-23 NOTE — Telephone Encounter (Signed)
Called patient to see if he was interested in participating in the Cardiac Rehab Program. Patient stated yes. Patient will come in for orientation on 07/15/21 @ 8AM and will attend the 830AM exercise class. Went over insurance, patient verbalized understanding.    Tourist information centre manager.

## 2021-07-09 ENCOUNTER — Telehealth (HOSPITAL_COMMUNITY): Payer: Self-pay

## 2021-07-09 NOTE — Telephone Encounter (Signed)
Successful telephone encounter to patient to confirm cardiac rehab orientation appointment for 07/15/21 at 8:00 am. Health assessment completed. Confirmed patient has received packet with directions and cardiac rehab contact. All questions answered.

## 2021-07-15 ENCOUNTER — Encounter (HOSPITAL_COMMUNITY)
Admission: RE | Admit: 2021-07-15 | Discharge: 2021-07-15 | Disposition: A | Discharge status: Home / Self Care | Payer: PPO | Source: Ambulatory Visit | Attending: Internal Medicine | Admitting: Internal Medicine

## 2021-07-15 ENCOUNTER — Encounter (HOSPITAL_COMMUNITY): Payer: Self-pay

## 2021-07-15 ENCOUNTER — Other Ambulatory Visit: Payer: Self-pay

## 2021-07-15 VITALS — BP 132/94 | HR 79 | Ht 70.0 in | Wt 217.6 lb

## 2021-07-15 DIAGNOSIS — Z951 Presence of aortocoronary bypass graft: Secondary | ICD-10-CM | POA: Insufficient documentation

## 2021-07-15 DIAGNOSIS — Z952 Presence of prosthetic heart valve: Secondary | ICD-10-CM

## 2021-07-15 DIAGNOSIS — Z953 Presence of xenogenic heart valve: Secondary | ICD-10-CM | POA: Insufficient documentation

## 2021-07-15 NOTE — Progress Notes (Signed)
Patient here for cardiac rehab orientation. Vital signs are as follows  Heart rate 70 Blood pressure 132/94 Resting heart rate/ BP Heart rate 104 Blood pressure 142/92 post 6 minute walk test Heart rate 69 Blood pressure 130/88 2 minutes post 6 minute walk test.  Marya Amsler is taking his medications as prescribed. Intermittent PVC's noted asymptomatic. Will forward today's blood pressures to Dr Oralia Rud office for review.  Barnet Pall, RN,BSN 07/15/2021 12:53 PM

## 2021-07-15 NOTE — Progress Notes (Signed)
Cardiac Rehab Medication Review by a Nurse  Does the patient  feel that his/her medications are working for him/her?  yes  Has the patient been experiencing any side effects to the medications prescribed?  no  Does the patient measure his/her own blood pressure or blood glucose at home?  yes   Does the patient have any problems obtaining medications due to transportation or finances?   no  Understanding of regimen: good Understanding of indications: good Potential of compliance: good    Nurse comments: Dwayne [name] is taking his medications as prescribed and has a good understanding of what his medications are for. Dwayne [name] has a BP cuff/ monitor at home. Dwayne [name] says he checks his blood pressures every other day.    Christa See Johnatan Baskette RN 07/15/2021 8:16 AM

## 2021-07-15 NOTE — Progress Notes (Signed)
Cardiac Individual Treatment Plan  Patient Details  Name: Dwayne [name] MRN: [id number] Date of Birth: 10/30/1955 Referring Provider:   Flowsheet Row CARDIAC REHAB PHASE II ORIENTATION from 07/15/2021 in [facility]  Referring Provider Rudean Haskell, MD       Initial Encounter Date:  [place] PHASE II ORIENTATION from 07/15/2021 in [facility]  Date 07/15/21       Visit Diagnosis: 04/15/21 S/P CABG x 3  04/15/21 S/P AVR (aortic valve replacement)  Patient's Home Medications on Admission:  Current Outpatient Medications:    acetaminophen (TYLENOL) 500 MG tablet, Take 500 mg by mouth every 6 (six) hours as needed for mild pain., Disp: , Rfl:    aspirin EC 325 MG tablet, Take 1 tablet (325 mg total) by mouth daily., Disp: 30 tablet, Rfl: 0   lisinopril (ZESTRIL) 5 MG tablet, Take 1 tablet (5 mg total) by mouth daily., Disp: 90 tablet, Rfl: 3   metoprolol succinate (TOPROL XL) 50 MG 24 hr tablet, Take 1.5 tablets (75 mg total) by mouth daily. Take with or immediately following a meal., Disp: 135 tablet, Rfl: 3   Multiple Vitamin (MULTIVITAMIN WITH MINERALS) TABS tablet, Take 1 tablet by mouth daily., Disp: , Rfl:    nitroGLYCERIN (NITROSTAT) 0.4 MG SL tablet, 0.4 mg every 5 (five) minutes as needed., Disp: , Rfl:    rosuvastatin (CRESTOR) 40 MG tablet, Take 40 mg by mouth daily., Disp: , Rfl:    terbinafine (LAMISIL) 250 MG tablet, Take 1 tablet (250 mg total) by mouth daily. (Patient taking differently: Take 250 mg by mouth every other day.), Disp: 30 tablet, Rfl: 0   ondansetron (ZOFRAN) 4 MG tablet, Take 4 mg by mouth every 8 (eight) hours as needed for nausea/vomiting, nausea or vomiting. (Patient not taking: Reported on 07/15/2021), Disp: , Rfl:    oxyCODONE (OXY IR/ROXICODONE) 5 MG immediate release tablet, Take 1 tablet (5 mg total) by mouth every 6 (six) hours as needed. (Patient not taking:  Reported on 07/15/2021), Disp: 30 tablet, Rfl: 0  Past Medical History: Past Medical History:  Diagnosis Date   Acute ST elevation myocardial infarction (STEMI) involving left anterior descending (LAD) coronary artery (HCC)    Adenomatous colon polyp 2000   Aortic insufficiency    Arthritis    Carotid stenosis    Coronary artery disease involving native coronary artery of native heart without angina pectoris 03/23/2016   Dilated aortic root (HCC)    Diverticulosis    Erectile dysfunction    History of stroke 01/23/2017   Hypercholesteremia    Hypertension    Internal hemorrhoids    Kidney stones    Myocardial infarction [facility])    Occipital cerebral infarction ([place]) 03/23/2016   S/P AVR    Splenomegaly     Tobacco Use: Social History   Tobacco Use  Smoking Status Never  Smokeless Tobacco Never    Labs: Recent Review Flowsheet Data     Labs for ITP Cardiac and Pulmonary Rehab Latest Ref Rng & Units 04/15/2021 04/15/2021 04/15/2021 04/16/2021 04/16/2021   Cholestrol 0 - 200 mg/dL - - - - -   LDLCALC 0 - 99 mg/dL - - - - -   HDL >40 mg/dL - - - - -   Trlycerides <150 mg/dL - - - - -   Hemoglobin A1c 4.8 - 5.6 % - - - - -   PHART 7.350 - 7.450 - 7.536(H) 7.367 7.408  7.419   PCO2ART 32.0 - 48.0 mmHg - 28.8(L) 47.4 40.4 38.8   HCO3 20.0 - 28.0 mmol/L - 24.4 27.2 25.3 25.0   TCO2 22 - 32 mmol/L 27 25 29 27 26    O2SAT % - 100.0 98.0 99.0 98.0       Capillary Blood Glucose: Lab Results  Component Value Date   GLUCAP 105 (H) 04/19/2021   GLUCAP 97 04/18/2021   GLUCAP 85 04/18/2021   GLUCAP 98 04/18/2021   GLUCAP 109 (H) 04/18/2021     Exercise Target Goals: Exercise Program Goal: Individual exercise prescription set using results from initial 6 min walk test and THRR while considering  patients activity barriers and safety.   Exercise Prescription Goal: Starting with aerobic activity 30 plus minutes a day, 3 days per week for initial exercise prescription.  Provide home exercise prescription and guidelines that participant acknowledges understanding prior to discharge.  Activity Barriers & Risk Stratification:  Activity Barriers & Cardiac Risk Stratification - 07/15/21 1016       Activity Barriers & Cardiac Risk Stratification   Activity Barriers Right Hip Replacement;Joint Problems    Cardiac Risk Stratification High             6 Minute Walk:  6 Minute Walk     Row Name 07/15/21 1015         6 Minute Walk   Phase Initial     Distance 1503 feet     Walk Time 6 minutes     # of Rest Breaks 0     MPH 2.85     METS 3.36     RPE 9     Perceived Dyspnea  0     VO2 Peak 11.75     Symptoms No     Resting HR 70 bpm     Resting BP 132/94     Resting Oxygen Saturation  97 %     Exercise Oxygen Saturation  during 6 min walk 98 %     Max Ex. HR 104 bpm     Max Ex. BP 142/92     2 Minute Post BP 130/88              Oxygen Initial Assessment:   Oxygen Re-Evaluation:   Oxygen Discharge (Final Oxygen Re-Evaluation):   Initial Exercise Prescription:  Initial Exercise Prescription - 07/15/21 1000       Date of Initial Exercise RX and Referring Provider   Date 07/15/21    Referring Provider Rudean Haskell, MD    Expected Discharge Date 09/10/21      NuStep   Level 3    SPM 75    Minutes 15    METs 2.5      Arm Ergometer   Level 2    RPM 60    Minutes 15    METs 2.8      Prescription Details   Frequency (times per week) 3    Duration Progress to 30 minutes of continuous aerobic without signs/symptoms of physical distress      Intensity   THRR 40-80% of Max Heartrate 61-122    Ratings of Perceived Exertion 11-13    Perceived Dyspnea 0-4      Progression   Progression Continue progressive overload as per policy without signs/symptoms or physical distress.      Resistance Training   Training Prescription Yes    Weight 4 lbs    Reps 10-15  Perform Capillary Blood Glucose  checks as needed.  Exercise Prescription Changes:   Exercise Comments:   Exercise Goals and Review:   Exercise Goals     Row Name 07/15/21 1017             Exercise Goals   Increase Physical Activity Yes       Intervention Provide advice, education, support and counseling about physical activity/exercise needs.;Develop an individualized exercise prescription for aerobic and resistive training based on initial evaluation findings, risk stratification, comorbidities and participant's personal goals.       Expected Outcomes Short Term: Attend rehab on a regular basis to increase amount of physical activity.;Long Term: Add in home exercise to make exercise part of routine and to increase amount of physical activity.;Long Term: Exercising regularly at least 3-5 days a week.       Increase Strength and Stamina Yes       Intervention Provide advice, education, support and counseling about physical activity/exercise needs.;Develop an individualized exercise prescription for aerobic and resistive training based on initial evaluation findings, risk stratification, comorbidities and participant's personal goals.       Expected Outcomes Short Term: Increase workloads from initial exercise prescription for resistance, speed, and METs.;Short Term: Perform resistance training exercises routinely during rehab and add in resistance training at home;Long Term: Improve cardiorespiratory fitness, muscular endurance and strength as measured by increased METs and functional capacity (6MWT)       Able to understand and use rate of perceived exertion (RPE) scale Yes       Intervention Provide education and explanation on how to use RPE scale       Expected Outcomes Short Term: Able to use RPE daily in rehab to express subjective intensity level;Long Term:  Able to use RPE to guide intensity level when exercising independently       Knowledge and understanding of Target Heart Rate Range (THRR) Yes        Intervention Provide education and explanation of THRR including how the numbers were predicted and where they are located for reference       Expected Outcomes Short Term: Able to state/look up THRR;Short Term: Able to use daily as guideline for intensity in rehab;Long Term: Able to use THRR to govern intensity when exercising independently       Understanding of Exercise Prescription Yes       Intervention Provide education, explanation, and written materials on patient's individual exercise prescription       Expected Outcomes Short Term: Able to explain program exercise prescription;Long Term: Able to explain home exercise prescription to exercise independently                Exercise Goals Re-Evaluation :    Discharge Exercise Prescription (Final Exercise Prescription Changes):   Nutrition:  Target Goals: Understanding of nutrition guidelines, daily intake of sodium 1500mg , cholesterol 200mg , calories 30% from fat and 7% or less from saturated fats, daily to have 5 or more servings of fruits and vegetables.  Biometrics:  Pre Biometrics - 07/15/21 0745       Pre Biometrics   Waist Circumference 44 inches    Hip Circumference 44.25 inches    Waist to Hip Ratio 0.99 %    Triceps Skinfold 13 mm    % Body Fat 29.9 %    Grip Strength 38 kg    Flexibility --   Not performed, recent hip replacement   Single Leg Stand 6.25 seconds  Nutrition Therapy Plan and Nutrition Goals:   Nutrition Assessments:  MEDIFICTS Score Key: ?70 Need to make dietary changes  40-70 Heart Healthy Diet ? 40 Therapeutic Level Cholesterol Diet   Picture Your Plate Scores: <00 Unhealthy dietary pattern with much room for improvement. 41-50 Dietary pattern unlikely to meet recommendations for good health and room for improvement. 51-60 More healthful dietary pattern, with some room for improvement.  >60 Healthy dietary pattern, although there may be some specific behaviors  that could be improved.    Nutrition Goals Re-Evaluation:   Nutrition Goals Discharge (Final Nutrition Goals Re-Evaluation):   Psychosocial: Target Goals: Acknowledge presence or absence of significant depression and/or stress, maximize coping skills, provide positive support system. Participant is able to verbalize types and ability to use techniques and skills needed for reducing stress and depression.  Initial Review & Psychosocial Screening:  Initial Psych Review & Screening - 07/15/21 0813       Initial Review   Current issues with None Identified      Family Dynamics   Good Support System? Yes   Marya Amsler has his wife and daughter  for support     Barriers   Psychosocial barriers to participate in program There are no identifiable barriers or psychosocial needs.      Screening Interventions   Interventions Encouraged to exercise             Quality of Life Scores:  Quality of Life - 07/15/21 1013       Quality of Life   Select Quality of Life      Quality of Life Scores   Health/Function Pre 27.07 %    Socioeconomic Pre 25.08 %    Psych/Spiritual Pre 25.21 %    Family Pre 28.8 %    GLOBAL Pre 26.58 %            Scores of 19 and below usually indicate a poorer quality of life in these areas.  A difference of  2-3 points is a clinically meaningful difference.  A difference of 2-3 points in the total score of the Quality of Life Index has been associated with significant improvement in overall quality of life, self-image, physical symptoms, and general health in studies assessing change in quality of life.  PHQ-9: Recent Review Flowsheet Data     Depression screen [facility] 2/9 07/15/2021   Decreased Interest 0   Down, Depressed, Hopeless 0   PHQ - 2 Score 0      Interpretation of Total Score  Total Score Depression Severity:  1-4 = Minimal depression, 5-9 = Mild depression, 10-14 = Moderate depression, 15-19 = Moderately severe depression, 20-27 = Severe  depression   Psychosocial Evaluation and Intervention:   Psychosocial Re-Evaluation:   Psychosocial Discharge (Final Psychosocial Re-Evaluation):   Vocational Rehabilitation: Provide vocational rehab assistance to qualifying candidates.   Vocational Rehab Evaluation & Intervention:  Vocational Rehab - 07/15/21 0901       Initial Vocational Rehab Evaluation & Intervention   Assessment shows need for Vocational Rehabilitation No   Marya Amsler is retired and does not need vocational rehab at this time            Education: Education Goals: Education classes will be provided on a weekly basis, covering required topics. Participant will state understanding/return demonstration of topics presented.  Learning Barriers/Preferences:  Learning Barriers/Preferences - 07/15/21 1014       Learning Barriers/Preferences   Learning Barriers None    Learning Preferences Audio;Written Material;Computer/Internet;Group Instruction;Individual  Instruction;Pictoral;Skilled Demonstration;Verbal Instruction             Education Topics: Hypertension, Hypertension Reduction -Define heart disease and high blood pressure. Discus how high blood pressure affects the body and ways to reduce high blood pressure.   Exercise and Your Heart -Discuss why it is important to exercise, the FITT principles of exercise, normal and abnormal responses to exercise, and how to exercise safely.   Angina -Discuss definition of angina, causes of angina, treatment of angina, and how to decrease risk of having angina.   Cardiac Medications -Review what the following cardiac medications are used for, how they affect the body, and side effects that may occur when taking the medications.  Medications include Aspirin, Beta blockers, calcium channel blockers, ACE Inhibitors, angiotensin receptor blockers, diuretics, digoxin, and antihyperlipidemics.   Congestive Heart Failure -Discuss the definition of CHF, how to  live with CHF, the signs and symptoms of CHF, and how keep track of weight and sodium intake.   Heart Disease and Intimacy -Discus the effect sexual activity has on the heart, how changes occur during intimacy as we age, and safety during sexual activity.   Smoking Cessation / COPD -Discuss different methods to quit smoking, the health benefits of quitting smoking, and the definition of COPD.   Nutrition I: Fats -Discuss the types of cholesterol, what cholesterol does to the heart, and how cholesterol levels can be controlled.   Nutrition II: Labels -Discuss the different components of food labels and how to read food label   Heart Parts/Heart Disease and PAD -Discuss the anatomy of the heart, the pathway of blood circulation through the heart, and these are affected by heart disease.   Stress I: Signs and Symptoms -Discuss the causes of stress, how stress may lead to anxiety and depression, and ways to limit stress.   Stress II: Relaxation -Discuss different types of relaxation techniques to limit stress.   Warning Signs of Stroke / TIA -Discuss definition of a stroke, what the signs and symptoms are of a stroke, and how to identify when someone is having stroke.   Knowledge Questionnaire Score:  Knowledge Questionnaire Score - 07/15/21 1014       Knowledge Questionnaire Score   Pre Score 19/24             Core Components/Risk Factors/Patient Goals at Admission:  Personal Goals and Risk Factors at Admission - 07/15/21 1014       Core Components/Risk Factors/Patient Goals on Admission    Weight Management Yes;Weight Maintenance    Intervention Weight Management: Develop a combined nutrition and exercise program designed to reach desired caloric intake, while maintaining appropriate intake of nutrient and fiber, sodium and fats, and appropriate energy expenditure required for the weight goal.;Weight Management: Provide education and appropriate resources to help  participant work on and attain dietary goals.;Weight Management/Obesity: Establish reasonable short term and long term weight goals.    Expected Outcomes Short Term: Continue to assess and modify interventions until short term weight is achieved;Long Term: Adherence to nutrition and physical activity/exercise program aimed toward attainment of established weight goal;Understanding of distribution of calorie intake throughout the day with the consumption of 4-5 meals/snacks    Hypertension Yes    Intervention Provide education on lifestyle modifcations including regular physical activity/exercise, weight management, moderate sodium restriction and increased consumption of fresh fruit, vegetables, and low fat dairy, alcohol moderation, and smoking cessation.;Monitor prescription use compliance.    Expected Outcomes Short Term: Continued assessment and intervention until  BP is < 140/95mm HG in hypertensive participants. < 130/53mm HG in hypertensive participants with diabetes, heart failure or chronic kidney disease.;Long Term: Maintenance of blood pressure at goal levels.    Lipids Yes    Intervention Provide education and support for participant on nutrition & aerobic/resistive exercise along with prescribed medications to achieve LDL 70mg , HDL >40mg .    Expected Outcomes Short Term: Participant states understanding of desired cholesterol values and is compliant with medications prescribed. Participant is following exercise prescription and nutrition guidelines.;Long Term: Cholesterol controlled with medications as prescribed, with individualized exercise RX and with personalized nutrition plan. Value goals: LDL < 70mg , HDL > 40 mg.             Core Components/Risk Factors/Patient Goals Review:    Core Components/Risk Factors/Patient Goals at Discharge (Final Review):    ITP Comments:  ITP Comments     Row Name 07/15/21 0813           ITP Comments Dr Fransico Him MD, Medical Director                 Comments: Marya Amsler attended orientation on 07/15/2021 to review rules and guidelines for program.  Completed 6 minute walk test, Intitial ITP, and exercise prescription. Diastolic BP's ranged from 88-94 will forward to Dr Gasper Sells. Telemetry-Sinus Rhythm, first degree heart block intermittent PVC's.  Asymptomatic. Safety measures and social distancing in place per CDC guidelines.Harrell Gave RN BSN

## 2021-07-19 ENCOUNTER — Encounter (HOSPITAL_COMMUNITY): Payer: PPO

## 2021-07-19 ENCOUNTER — Telehealth (HOSPITAL_COMMUNITY): Payer: Self-pay | Admitting: *Deleted

## 2021-07-19 ENCOUNTER — Telehealth: Payer: Self-pay | Admitting: Internal Medicine

## 2021-07-19 DIAGNOSIS — I10 Essential (primary) hypertension: Secondary | ICD-10-CM

## 2021-07-19 DIAGNOSIS — Z79899 Other long term (current) drug therapy: Secondary | ICD-10-CM

## 2021-07-19 NOTE — Telephone Encounter (Signed)
-----   Message from Werner Lean, MD sent at 07/17/2021  3:19 PM EST ----- Regarding: FW: diastolic BP elevations and PVC's We can increase his lisinopril to 10 mg for HTN BMP in two weeks  ----- Message ----- From: Magda Kiel, RN Sent: 07/15/2021   1:45 PM EST To: Werner Lean, MD, # Subject: diastolic BP elevations and PVC's              Good afternoon Dr Gasper Sells,  Mr Lehrmann attended  cardiac rehab orientation this morning. I want to bring it to your attention that Mr Grigorian's diastolic BP's were elevated today  Vital signs are as follows  Heart rate 70 Blood pressure 132/94 Resting heart rate/ BP Heart rate 104 Blood pressure 142/92 post 6 minute walk test Heart rate 69 Blood pressure 130/88 2 minutes post 6 minute walk test.   Mr Timberman is taking 75 mg of Toprol Daily and 5 mg of lisinopril daily. I asked the patient how his blood pressures are at home he said similar. Marya Amsler is taking his medications as prescribed. Intermittent PVC's were also noted  he was asymptomatic. It looks like he had some PVC's in the hospital.  Mr Kargbo will start exercise on 07/26/21 as he will be out of town next week.  Please let us know if you have any concerns with exercise.  Thanks for your assistance,  I am faxing the vital signs and ECG tracings to your office for review.  Sincerely, Barnet Pall RN Cardiac Rehab

## 2021-07-19 NOTE — Telephone Encounter (Signed)
Left the pt a message to call the office back and request to speak with any triage nurse, to endorse medication regimen and increase in lisinopril, as indicated in this note by Dr. Gasper Sells and Verdis Frederickson RN from [facility].      Dwayne Kiel, RN      8:30 AM Note ----- Message from Dwayne Lean, MD sent at 07/17/2021  3:19 PM EST ----- Regarding: FW: diastolic BP elevations and PVC's We can increase his lisinopril to 10 mg for HTN BMP in two weeks   ----- Message ----- From: Dwayne Kiel, RN Sent: 07/15/2021   1:45 PM EST To: Dwayne Lean, MD, # Subject: diastolic BP elevations and PVC's               Good afternoon Dr Gasper Sells,   Dwayne [name] attended  cardiac rehab orientation this morning. I want to bring it to your attention that Dwayne [name]'s diastolic BP's were elevated today  Vital signs are as follows  Heart rate 70 Blood pressure 132/94 Resting heart rate/ BP Heart rate 104 Blood pressure 142/92 post 6 minute walk test Heart rate 69 Blood pressure 130/88 2 minutes post 6 minute walk test.    Dwayne [name] is taking 75 mg of Toprol Daily and 5 mg of lisinopril daily. I asked the patient how his blood pressures are at home he said similar. Dwayne [name] is taking his medications as prescribed. Intermittent PVC's were also noted  he was asymptomatic. It looks like he had some PVC's in the hospital.   Dwayne [name] will start exercise on 07/26/21 as he will be out of town next week.   Please let us know if you have any concerns with exercise.   Thanks for your assistance,   I am faxing the vital signs and ECG tracings to your office for review.   Sincerely, Barnet Pall RN Cardiac Rehab

## 2021-07-19 NOTE — Telephone Encounter (Signed)
Cardiac rehab called and wanted to see if triage could call patient to let him know of his dosage increase in the medication lisinopril (ZESTRIL) 5 MG tablet. Patient is currently on vacation and will back into their office next week. If you have any questions, please contact cardiac rehab

## 2021-07-21 ENCOUNTER — Encounter (HOSPITAL_COMMUNITY): Payer: PPO

## 2021-07-21 MED ORDER — LISINOPRIL 10 MG PO TABS: 10.0000 mg | ORAL_TABLET | Freq: Every day | ORAL | 3 refills | Status: DC

## 2021-07-21 NOTE — Telephone Encounter (Signed)
-----   Message from Werner Lean, MD sent at 07/17/2021  3:19 PM EST ----- Regarding: FW: diastolic BP elevations and PVC's We can increase his lisinopril to 10 mg for HTN BMP in two weeks  Called and spoke to patient, orders placed for repeat BMP, lab appt scheduled for 3/10, Lisinopril RX sent to pharmacy on file. Pt states he will take 2 tablets of what he currently has before getting this rx (currently on 5mg )

## 2021-07-23 ENCOUNTER — Encounter (HOSPITAL_COMMUNITY): Payer: PPO

## 2021-07-26 ENCOUNTER — Other Ambulatory Visit: Payer: Self-pay

## 2021-07-26 ENCOUNTER — Encounter (HOSPITAL_COMMUNITY)
Admission: RE | Admit: 2021-07-26 | Discharge: 2021-07-26 | Disposition: A | Discharge status: Home / Self Care | Payer: PPO | Source: Ambulatory Visit | Attending: Internal Medicine | Admitting: Internal Medicine

## 2021-07-26 DIAGNOSIS — Z951 Presence of aortocoronary bypass graft: Secondary | ICD-10-CM

## 2021-07-26 DIAGNOSIS — Z952 Presence of prosthetic heart valve: Secondary | ICD-10-CM

## 2021-07-26 DIAGNOSIS — Z953 Presence of xenogenic heart valve: Secondary | ICD-10-CM | POA: Diagnosis not present

## 2021-07-26 NOTE — Progress Notes (Signed)
Daily Session Note  Patient Details  Name: Dwayne [name] MRN: [id number] Date of Birth: 07/21/1955 Referring Provider:   Flowsheet Row CARDIAC REHAB PHASE II ORIENTATION from 07/15/2021 in [place]  Referring Provider Rudean Haskell, MD       Encounter Date: 07/26/2021  Check In:  Session Check In - 07/26/21 0830       Check-In   Supervising physician immediately available to respond to emergencies [facility] immediately available    Physician(s) Dr Eliseo Squires    Location [facility]    Staff Present Barnet Pall, RN, Milus Glazier, MS, ACSM-CEP, CCRP, Exercise Physiologist;Jetta Walker BS, ACSM EP-C, Exercise Physiologist    Virtual Visit No    Medication changes reported     Yes    Comments 10 mg increase Lisinopril  once daily    Fall or balance concerns reported    No    Tobacco Cessation No Change    Current number of cigarettes/nicotine per day     0    Warm-up and Cool-down Performed as group-led instruction    Resistance Training Performed Yes    VAD Patient? No    PAD/SET Patient? No      Pain Assessment   Currently in Pain? No/denies    Pain Score 0-No pain    Multiple Pain Sites No             Capillary Blood Glucose: No results found for this or any previous visit (from the past 24 hour(s)).   Exercise Prescription Changes - 07/26/21 1100       Response to Exercise   Blood Pressure (Admit) 120/80    Blood Pressure (Exercise) 150/84    Blood Pressure (Exit) 128/84    Heart Rate (Admit) 77 bpm    Heart Rate (Exercise) 112 bpm    Heart Rate (Exit) 77 bpm    Rating of Perceived Exertion (Exercise) 11    Symptoms None    Comments Pt's first day in the CRP2 program    Duration Continue with 30 min of aerobic exercise without signs/symptoms of physical distress.    Intensity THRR unchanged      Progression   Progression Continue to progress workloads to maintain intensity without  signs/symptoms of physical distress.    Average METs 2.6      Resistance Training   Training Prescription Yes    Weight 4 lbs    Reps 10-15    Time 10 Minutes      Interval Training   Interval Training No      NuStep   Level 3    SPM 80    Minutes 15    METs 2.7      Arm Ergometer   Level 2.5    Watts 20    RPM 60    Minutes 15    METs 2.5             Social History   Tobacco Use  Smoking Status Never  Smokeless Tobacco Never    Goals Met:  Personal goals reviewed No report of concerns or symptoms today Strength training completed today  Goals Unmet:  Not Applicable  Comments: Dwayne [name] started cardiac rehab today.  Pt tolerated light exercise without difficulty. VSS, telemetry-Sinus Rhythm with PVC, asymptomatic.  Medication list reconciled. Pt denies barriers to medicaiton compliance.  PSYCHOSOCIAL ASSESSMENT:  PHQ-0. Pt exhibits positive coping skills, hopeful outlook with supportive family. No psychosocial needs identified at this  time, no psychosocial interventions necessary.    Pt enjoys wine, riding his motorcycle and traveling.   Pt oriented to exercise equipment and routine.    Understanding verbalized. Blood pressures were improved today as Dr Joan Mayans increased his lisinopril.Barnet Pall, RN,BSN 07/26/2021 1:37 PM    Dr. Fransico Him is Medical Director for Cardiac Rehab at [facility].

## 2021-07-28 ENCOUNTER — Encounter (HOSPITAL_COMMUNITY)
Admission: RE | Admit: 2021-07-28 | Discharge: 2021-07-28 | Disposition: A | Discharge status: Home / Self Care | Payer: PPO | Source: Ambulatory Visit | Attending: Internal Medicine | Admitting: Internal Medicine

## 2021-07-28 ENCOUNTER — Other Ambulatory Visit: Payer: Self-pay

## 2021-07-28 ENCOUNTER — Other Ambulatory Visit: Payer: Self-pay | Admitting: Cardiovascular Disease

## 2021-07-28 DIAGNOSIS — Z951 Presence of aortocoronary bypass graft: Secondary | ICD-10-CM | POA: Diagnosis not present

## 2021-07-28 DIAGNOSIS — Z952 Presence of prosthetic heart valve: Secondary | ICD-10-CM | POA: Insufficient documentation

## 2021-07-30 ENCOUNTER — Other Ambulatory Visit: Payer: Self-pay

## 2021-07-30 ENCOUNTER — Encounter (HOSPITAL_COMMUNITY)
Admission: RE | Admit: 2021-07-30 | Discharge: 2021-07-30 | Disposition: A | Discharge status: Home / Self Care | Payer: PPO | Source: Ambulatory Visit | Attending: Internal Medicine | Admitting: Internal Medicine

## 2021-07-30 DIAGNOSIS — Z952 Presence of prosthetic heart valve: Secondary | ICD-10-CM

## 2021-07-30 DIAGNOSIS — Z951 Presence of aortocoronary bypass graft: Secondary | ICD-10-CM | POA: Diagnosis not present

## 2021-08-02 ENCOUNTER — Other Ambulatory Visit: Payer: Self-pay

## 2021-08-02 ENCOUNTER — Encounter (HOSPITAL_COMMUNITY)
Admission: RE | Admit: 2021-08-02 | Discharge: 2021-08-02 | Disposition: A | Discharge status: Home / Self Care | Payer: PPO | Source: Ambulatory Visit | Attending: Internal Medicine | Admitting: Internal Medicine

## 2021-08-02 DIAGNOSIS — Z952 Presence of prosthetic heart valve: Secondary | ICD-10-CM

## 2021-08-02 DIAGNOSIS — Z951 Presence of aortocoronary bypass graft: Secondary | ICD-10-CM | POA: Diagnosis not present

## 2021-08-04 ENCOUNTER — Encounter (HOSPITAL_COMMUNITY)
Admission: RE | Admit: 2021-08-04 | Discharge: 2021-08-04 | Disposition: A | Discharge status: Home / Self Care | Payer: PPO | Source: Ambulatory Visit | Attending: Internal Medicine | Admitting: Internal Medicine

## 2021-08-04 ENCOUNTER — Other Ambulatory Visit: Payer: Self-pay

## 2021-08-04 DIAGNOSIS — Z951 Presence of aortocoronary bypass graft: Secondary | ICD-10-CM

## 2021-08-04 DIAGNOSIS — Z952 Presence of prosthetic heart valve: Secondary | ICD-10-CM

## 2021-08-06 ENCOUNTER — Other Ambulatory Visit: Payer: Self-pay

## 2021-08-06 ENCOUNTER — Encounter (HOSPITAL_COMMUNITY)
Admission: RE | Admit: 2021-08-06 | Discharge: 2021-08-06 | Disposition: A | Discharge status: Home / Self Care | Payer: PPO | Source: Ambulatory Visit | Attending: Internal Medicine | Admitting: Internal Medicine

## 2021-08-06 ENCOUNTER — Other Ambulatory Visit: Payer: PPO | Admitting: *Deleted

## 2021-08-06 DIAGNOSIS — I10 Essential (primary) hypertension: Secondary | ICD-10-CM | POA: Diagnosis not present

## 2021-08-06 DIAGNOSIS — Z79899 Other long term (current) drug therapy: Secondary | ICD-10-CM

## 2021-08-06 DIAGNOSIS — Z952 Presence of prosthetic heart valve: Secondary | ICD-10-CM

## 2021-08-06 DIAGNOSIS — Z951 Presence of aortocoronary bypass graft: Secondary | ICD-10-CM

## 2021-08-06 LAB — BASIC METABOLIC PANEL
BUN/Creatinine Ratio: 14 (ref 10–24)
BUN: 12 mg/dL (ref 8–27)
CO2: 22 mmol/L (ref 20–29)
Calcium: 9.2 mg/dL (ref 8.6–10.2)
Chloride: 100 mmol/L (ref 96–106)
Creatinine, Ser: 0.87 mg/dL (ref 0.76–1.27)
Glucose: 92 mg/dL (ref 70–99)
Potassium: 4.3 mmol/L (ref 3.5–5.2)
Sodium: 136 mmol/L (ref 134–144)
eGFR: 95 mL/min/{1.73_m2} (ref >59)

## 2021-08-09 ENCOUNTER — Other Ambulatory Visit: Payer: Self-pay

## 2021-08-09 ENCOUNTER — Encounter (HOSPITAL_COMMUNITY)
Admission: RE | Admit: 2021-08-09 | Discharge: 2021-08-09 | Disposition: A | Discharge status: Home / Self Care | Payer: PPO | Source: Ambulatory Visit | Attending: Internal Medicine | Admitting: Internal Medicine

## 2021-08-09 DIAGNOSIS — Z952 Presence of prosthetic heart valve: Secondary | ICD-10-CM

## 2021-08-09 DIAGNOSIS — Z951 Presence of aortocoronary bypass graft: Secondary | ICD-10-CM

## 2021-08-11 ENCOUNTER — Encounter (HOSPITAL_COMMUNITY)
Admission: RE | Admit: 2021-08-11 | Discharge: 2021-08-11 | Disposition: A | Discharge status: Home / Self Care | Payer: PPO | Source: Ambulatory Visit | Attending: Internal Medicine | Admitting: Internal Medicine

## 2021-08-11 ENCOUNTER — Other Ambulatory Visit: Payer: Self-pay

## 2021-08-11 DIAGNOSIS — Z951 Presence of aortocoronary bypass graft: Secondary | ICD-10-CM

## 2021-08-11 DIAGNOSIS — Z952 Presence of prosthetic heart valve: Secondary | ICD-10-CM

## 2021-08-11 NOTE — Progress Notes (Signed)
Cardiac Individual Treatment Plan ? ?Patient Details  ?[name]: Dwayne [name] ?MRN: [id number] ?Date of Birth: [birth year] ?Referring Provider:   ?Flowsheet Row CARDIAC REHAB PHASE II ORIENTATION from 07/15/2021 in [place]  ?Referring Provider Dwayne Haskell, MD  ? ?  ? ? ?Initial Encounter Date:  ?Flowsheet Row CARDIAC REHAB PHASE II ORIENTATION from 07/15/2021 in [place]  ?Date 07/15/21  ? ?  ? ? ?Visit Diagnosis: 04/15/21 S/P AVR (aortic valve replacement) ? ?04/15/21 S/P CABG x 3 ? ?Patient's Home Medications on Admission: ? ?Current Outpatient Medications:  ?  acetaminophen (TYLENOL) 500 MG tablet, Take 500 mg by mouth every 6 (six) hours as needed for mild pain., Disp: , Rfl:  ?  aspirin EC 325 MG tablet, Take 1 tablet (325 mg total) by mouth daily., Disp: 30 tablet, Rfl: 0 ?  lisinopril (ZESTRIL) 10 MG tablet, Take 1 tablet (10 mg total) by mouth daily., Disp: 90 tablet, Rfl: 3 ?  metoprolol succinate (TOPROL XL) 50 MG 24 hr tablet, Take 1.5 tablets (75 mg total) by mouth daily. Take with or immediately following a meal., Disp: 135 tablet, Rfl: 3 ?  Multiple Vitamin (MULTIVITAMIN WITH MINERALS) TABS tablet, Take 1 tablet by mouth daily., Disp: , Rfl:  ?  nitroGLYCERIN (NITROSTAT) 0.4 MG SL tablet, 0.4 mg every 5 (five) minutes as needed., Disp: , Rfl:  ?  ondansetron (ZOFRAN) 4 MG tablet, Take 4 mg by mouth every 8 (eight) hours as needed for nausea/vomiting, nausea or vomiting. (Patient not taking: Reported on 07/15/2021), Disp: , Rfl:  ?  oxyCODONE (OXY IR/ROXICODONE) 5 MG immediate release tablet, Take 1 tablet (5 mg total) by mouth every 6 (six) hours as needed. (Patient not taking: Reported on 07/15/2021), Disp: 30 tablet, Rfl: 0 ?  rosuvastatin (CRESTOR) 40 MG tablet, Take 40 mg by mouth daily., Disp: , Rfl:  ?  terbinafine (LAMISIL) 250 MG tablet, Take 1 tablet (250 mg total) by mouth daily. (Patient taking differently: Take 250 mg by  mouth every other day.), Disp: 30 tablet, Rfl: 0 ? ?Past Medical History: ?Past Medical History:  ?Diagnosis Date  ? Acute ST elevation myocardial infarction (STEMI) involving left anterior descending (LAD) coronary artery (HCC)   ? Adenomatous colon polyp 2000  ? Aortic insufficiency   ? Arthritis   ? Carotid stenosis   ? Coronary artery disease involving native coronary artery of native heart without angina pectoris 03/23/2016  ? Dilated aortic root ([place])   ? Diverticulosis   ? Erectile dysfunction   ? History of stroke 01/23/2017  ? Hypercholesteremia   ? Hypertension   ? Internal hemorrhoids   ? Kidney stones   ? Myocardial infarction [facility])   ? Occipital cerebral infarction ([place]) 03/23/2016  ? S/P AVR   ? Splenomegaly   ? ? ?Tobacco Use: ?Social History  ? ?Tobacco Use  ?Smoking Status Never  ?Smokeless Tobacco Never  ? ? ?Labs: ?Recent Review Flowsheet Data   ? ? Labs for [facility] Cardiac and Pulmonary Rehab Latest Ref Rng & Units 04/15/2021 04/15/2021 04/15/2021 04/16/2021 04/16/2021  ? Cholestrol 0 - 200 mg/dL - - - - -  ? LDLCALC 0 - 99 mg/dL - - - - -  ? HDL >40 mg/dL - - - - -  ? Trlycerides <150 mg/dL - - - - -  ? Hemoglobin A1c 4.8 - 5.6 % - - - - -  ? PHART 7.350 - 7.450 - 7.536(H) 7.367 7.408  7.419  ? PCO2ART 32.0 - 48.0 mmHg - 28.8(L) 47.4 40.4 38.8  ? HCO3 20.0 - 28.0 mmol/L - 24.4 27.2 25.3 25.0  ? TCO2 22 - 32 mmol/L '27 25 29 27 26  '$ ? O2SAT % - 100.0 98.0 99.0 98.0  ? ?  ? ? ?Capillary Blood Glucose: ?Lab Results  ?Component Value Date  ? GLUCAP 105 (H) 04/19/2021  ? GLUCAP 97 04/18/2021  ? GLUCAP 85 04/18/2021  ? GLUCAP 98 04/18/2021  ? GLUCAP 109 (H) 04/18/2021  ? ? ? ?Exercise Target Goals: ?Exercise Program Goal: ?Individual exercise prescription set using results from initial 6 min walk test and THRR while considering  patient?s activity barriers and safety.  ? ?Exercise Prescription Goal: ?Starting with aerobic activity 30 plus minutes a day, 3 days per week for initial exercise prescription.  Provide home exercise prescription and guidelines that participant acknowledges understanding prior to discharge. ? ?Activity Barriers & Risk Stratification: ? Activity Barriers & Cardiac Risk Stratification - 07/15/21 1016   ? ?  ? Activity Barriers & Cardiac Risk Stratification  ? Activity Barriers Right Hip Replacement;Joint Problems   ? Cardiac Risk Stratification High   ? ?  ?  ? ?  ? ? ?6 Minute Walk: ? 6 Minute Walk   ? ? Dwayne [name] 07/15/21 1015  ?  ?  ?  ? 6 Minute Walk  ? Phase Initial    ? Distance 1503 feet    ? Walk Time 6 minutes    ? # of Rest Breaks 0    ? MPH 2.85    ? METS 3.36    ? RPE 9    ? Perceived Dyspnea  0    ? VO2 Peak 11.75    ? Symptoms No    ? Resting HR 70 bpm    ? Resting BP 132/94    ? Resting Oxygen Saturation  97 %    ? Exercise Oxygen Saturation  during 6 min walk 98 %    ? Max Ex. HR 104 bpm    ? Max Ex. BP 142/92    ? 2 Minute Post BP 130/88    ? ?  ?  ? ?  ? ? ?Oxygen Initial Assessment: ? ? ?Oxygen Re-Evaluation: ? ? ?Oxygen Discharge (Final Oxygen Re-Evaluation): ? ? ?Initial Exercise Prescription: ? Initial Exercise Prescription - 07/15/21 1000   ? ?  ? Date of Initial Exercise RX and Referring Provider  ? Date 07/15/21   ? Referring Provider Dwayne Haskell, MD   ? Expected Discharge Date 09/10/21   ?  ? NuStep  ? Level 3   ? SPM 75   ? Minutes 15   ? METs 2.5   ?  ? Arm Ergometer  ? Level 2   ? RPM 60   ? Minutes 15   ? METs 2.8   ?  ? Prescription Details  ? Frequency (times per week) 3   ? Duration Progress to 30 minutes of continuous aerobic without signs/symptoms of physical distress   ?  ? Intensity  ? THRR 40-80% of Max Heartrate 61-122   ? Ratings of Perceived Exertion 11-13   ? Perceived Dyspnea 0-4   ?  ? Progression  ? Progression Continue progressive overload as per policy without signs/symptoms or physical distress.   ?  ? Resistance Training  ? Training Prescription Yes   ? Weight 4 lbs   ? Reps 10-15   ? ?  ?  ? ?  ? ? ?  Perform Capillary Blood Glucose  checks as needed. ? ?Exercise Prescription Changes: ? ? Exercise Prescription Changes   ? ? Dwayne [name] 07/26/21 1100 08/04/21 1200  ?  ?  ?  ?  ? Response to Exercise  ? Blood Pressure (Admit) 120/80 135/88     ? Blood Pressure (Exercise) 150/84 142/92     ? Blood Pressure (Exit) 128/84 114/84     ? Heart Rate (Admit) 77 bpm 81 bpm     ? Heart Rate (Exercise) 112 bpm 105 bpm     ? Heart Rate (Exit) 77 bpm 84 bpm     ? Rating of Perceived Exertion (Exercise) 11 10     ? Symptoms None None     ? Comments Pt's first day in the CRP2 program Reviewed METs     ? Duration Continue with 30 min of aerobic exercise without signs/symptoms of physical distress. Continue with 30 min of aerobic exercise without signs/symptoms of physical distress.     ? Intensity THRR unchanged THRR unchanged     ?  ? Progression  ? Progression Continue to progress workloads to maintain intensity without signs/symptoms of physical distress. Continue to progress workloads to maintain intensity without signs/symptoms of physical distress.     ? Average METs 2.6 3.1     ?  ? Resistance Training  ? Training Prescription Yes No     ? Weight 4 lbs No weights on Wednesdays     ? Reps 10-15 --     ? Time 10 Minutes --     ?  ? Interval Training  ? Interval Training No No     ?  ? NuStep  ? Level 3 3     ? SPM 80 90     ? Minutes 15 15     ? METs 2.7 3.2     ?  ? Arm Ergometer  ? Level 2.5 2.5     ? Watts 20 --     ? RPM 60 --     ? Minutes 15 15     ? METs 2.5 3     ? ?  ?  ? ?  ? ? ?Exercise Comments: ? ? Exercise Comments   ? ? Ecorse [name] 07/26/21 1159 08/04/21 1210  ?  ?  ?  ? Exercise Comments Pt's first day in the CRP2 program. No complaints with todays session and pt is off to a good start. Reviewed METs. Pt making good progress in the [place] program.     ? ?  ?  ? ?  ? ? ?Exercise Goals and Review: ? ? Exercise Goals   ? ? Fallis [name] 07/15/21 1017  ?  ?  ?  ?  ?  ? Exercise Goals  ? Increase Physical Activity Yes      ? Intervention Provide advice,  education, support and counseling about physical activity/exercise needs.;Develop an individualized exercise prescription for aerobic and resistive training based on initial evaluation findings, risk stratificatio

## 2021-08-13 ENCOUNTER — Other Ambulatory Visit: Payer: Self-pay

## 2021-08-13 ENCOUNTER — Encounter (HOSPITAL_COMMUNITY)
Admission: RE | Admit: 2021-08-13 | Discharge: 2021-08-13 | Disposition: A | Discharge status: Home / Self Care | Payer: PPO | Source: Ambulatory Visit | Attending: Internal Medicine | Admitting: Internal Medicine

## 2021-08-13 DIAGNOSIS — Z952 Presence of prosthetic heart valve: Secondary | ICD-10-CM

## 2021-08-13 DIAGNOSIS — Z951 Presence of aortocoronary bypass graft: Secondary | ICD-10-CM

## 2021-08-13 NOTE — Progress Notes (Addendum)
Reviewed home exercise Rx with patient today.  Encouraged warm-up, cool-down, and stretching. Reviewed THRR of  61-122 and keeping RPE between 11-13. Encouraged to hydrate with activity.  ?Reviewed weather parameters for temperature and humidity for safe exercise outdoors. Reviewed S/S to terminate exercise and when to call 911 vs MD. Reviewed the use of NTG and pt was encouraged to carry at all times. Pt encouraged to always carry a cell phone for safety when exercising outdoors. Pt verbalized understanding of the home exercise Rx and was provided a copy.  ? ?Lesly Rubenstein MS, ACSM-CEP, CCRP  ?

## 2021-08-16 ENCOUNTER — Encounter (HOSPITAL_COMMUNITY): Payer: PPO

## 2021-08-18 ENCOUNTER — Encounter (HOSPITAL_COMMUNITY)
Admission: RE | Admit: 2021-08-18 | Discharge: 2021-08-18 | Disposition: A | Discharge status: Home / Self Care | Payer: PPO | Source: Ambulatory Visit | Attending: Internal Medicine | Admitting: Internal Medicine

## 2021-08-18 ENCOUNTER — Other Ambulatory Visit: Payer: Self-pay

## 2021-08-18 DIAGNOSIS — Z951 Presence of aortocoronary bypass graft: Secondary | ICD-10-CM

## 2021-08-18 DIAGNOSIS — Z952 Presence of prosthetic heart valve: Secondary | ICD-10-CM

## 2021-08-20 ENCOUNTER — Ambulatory Visit: Payer: PPO | Admitting: Internal Medicine

## 2021-08-20 ENCOUNTER — Encounter (HOSPITAL_COMMUNITY)
Admission: RE | Admit: 2021-08-20 | Discharge: 2021-08-20 | Disposition: A | Discharge status: Home / Self Care | Payer: PPO | Source: Ambulatory Visit | Attending: Internal Medicine | Admitting: Internal Medicine

## 2021-08-20 ENCOUNTER — Other Ambulatory Visit: Payer: Self-pay

## 2021-08-20 ENCOUNTER — Encounter (HOSPITAL_COMMUNITY): Payer: PPO

## 2021-08-20 DIAGNOSIS — Z952 Presence of prosthetic heart valve: Secondary | ICD-10-CM

## 2021-08-20 DIAGNOSIS — Z951 Presence of aortocoronary bypass graft: Secondary | ICD-10-CM

## 2021-08-23 ENCOUNTER — Encounter (HOSPITAL_COMMUNITY)
Admission: RE | Admit: 2021-08-23 | Discharge: 2021-08-23 | Disposition: A | Discharge status: Home / Self Care | Payer: PPO | Source: Ambulatory Visit | Attending: Internal Medicine | Admitting: Internal Medicine

## 2021-08-23 ENCOUNTER — Other Ambulatory Visit: Payer: Self-pay

## 2021-08-23 DIAGNOSIS — Z951 Presence of aortocoronary bypass graft: Secondary | ICD-10-CM

## 2021-08-23 DIAGNOSIS — Z952 Presence of prosthetic heart valve: Secondary | ICD-10-CM

## 2021-08-23 NOTE — Progress Notes (Signed)
?Cardiology Office Note:   ? ?Date:  08/25/2021  ? ?ID:  Dwayne [name], DOB 1955-11-11, MRN [id number] ? ?PCP:  Gaynelle Arabian, MD ? ?Pronunciation: Per Mar' ?  ?[facility] Providers ?Cardiologist:  Werner Lean, MD    ? ?Referring MD: Gaynelle Arabian, MD  ? ?CC:  follow up CABG ? ?History of Present Illness:   ? ?Dwayne [name] is a 66 y.o. male with a hx of CAD s/p prior PCI (LAD STEMI 2017), Ischemic Cardiomyopathy EF 25-30%-> 40-45%, Prior CVA in 2017 with mild CAS, HTN and HLD seen 03/17/21.  Had hip surgery without issue.   ?Seen in 2022: Found to have multivessel CAD with 80% LM stenosis warranting CABG. His echo showed EF 45%, mild-moderate AI with mild dilation of aortic root. Intra-op TEE confirmed moderate AI with mild dilation of aortic root. He underwent CABG x3 with LIMA to LAD, reverse saphenous vein graft to obtuse marginal, reverse saphenous vein graft to first diagonal, AVR with 27 mm Inspiris Resilia tissue valve. ? ?Patient notes that he is doing fine.   ?Has been doing well in Cardiac rehab; we have received notes about his care through there ?There are no interval hospital/ED visit.   ? ?No chest pain or pressure .  No SOB/DOE and no PND/Orthopnea.  No weight gain or leg swelling.  No palpitations or syncope. No nitroglycerin needed. ? ?Ambulatory blood pressure 130/90. ? ? ?Past Medical History:  ?Diagnosis Date  ? Acute ST elevation myocardial infarction (STEMI) involving left anterior descending (LAD) coronary artery (HCC)   ? Adenomatous colon polyp 2000  ? Aortic insufficiency   ? Arthritis   ? Carotid stenosis   ? Coronary artery disease involving native coronary artery of native heart without angina pectoris 03/23/2016  ? Dilated aortic root ([place])   ? Diverticulosis   ? Erectile dysfunction   ? History of stroke 01/23/2017  ? Hypercholesteremia   ? Hypertension   ? Internal hemorrhoids   ? Kidney stones   ? Myocardial infarction [facility])   ? Occipital cerebral infarction  ([place]) 03/23/2016  ? S/P AVR   ? Splenomegaly   ? ? ?Past Surgical History:  ?Procedure Laterality Date  ? AORTIC VALVE REPLACEMENT N/A 04/15/2021  ? Procedure: AORTIC VALVE REPLACEMENT (AVR) USING 27 MM INSPIRIS RESILIA  AORTIC VALVE;  Surgeon: Lajuana Matte, MD;  Location: [place];  Service: Open Heart Surgery;  Laterality: N/A;  ? CARDIAC CATHETERIZATION N/A 01/20/2016  ? Procedure: Left Heart Cath and Coronary Angiography;  Surgeon: Burnell Blanks, MD; LAD 100%, D1 100%, multi-vessel dz 10-30% in other vessels, EF 25-35%, elevated LVEDP  ? CARDIAC CATHETERIZATION N/A 01/20/2016  ? Procedure: Coronary Stent Intervention;  Surgeon: Burnell Blanks, MD; PROMUS PREM MR 3.5X16 mm DES LAD, D1 jailed by the stent but was already occluded  ? CHOLECYSTECTOMY  2009  ? COLONOSCOPY    ? CORONARY ARTERY BYPASS GRAFT N/A 04/15/2021  ? Procedure: CORONARY ARTERY BYPASS GRAFTING (CABG) x 3 ON CARDIOPULMONARY BYPASS;  Surgeon: Lajuana Matte, MD;  Location: [place];  Service: Open Heart Surgery;  Laterality: N/A;   ?Flow trac  ? ENDOVEIN HARVEST OF GREATER SAPHENOUS VEIN Right 04/15/2021  ? Procedure: ENDOVEIN HARVEST OF GREATER SAPHENOUS VEIN;  Surgeon: Lajuana Matte, MD;  Location: [place];  Service: Open Heart Surgery;  Laterality: Right;  ? EYE SURGERY    ? Age 29  ? LEFT HEART CATH AND CORONARY ANGIOGRAPHY N/A 04/13/2021  ? Procedure:  LEFT HEART CATH AND CORONARY ANGIOGRAPHY;  Surgeon: Troy Sine, MD;  Location: [facility];  Service: Cardiovascular;  Laterality: N/A;  ? [place]  ? REFRACTIVE SURGERY  2008  ? SHOULDER SURGERY  1998  ? Left shoulder fracture-clavicle  ? WRIST FRACTURE SURGERY  1995  ? Bilateral  ? ? ?Current Medications: ?Current Meds  ?Medication Sig  ? acetaminophen (TYLENOL) 500 MG tablet Take 500 mg by mouth every 6 (six) hours as needed for mild pain.  ? aspirin EC 325 MG tablet Take 1 tablet (325 mg total) by mouth daily.  ? lisinopril (ZESTRIL) 10  MG tablet Take 1 tablet (10 mg total) by mouth daily.  ? metoprolol succinate (TOPROL XL) 50 MG 24 hr tablet Take 1.5 tablets (75 mg total) by mouth daily. Take with or immediately following a meal.  ? Multiple Vitamin (MULTIVITAMIN WITH MINERALS) TABS tablet Take 1 tablet by mouth daily.  ? nitroGLYCERIN (NITROSTAT) 0.4 MG SL tablet 0.4 mg every 5 (five) minutes as needed.  ? rosuvastatin (CRESTOR) 40 MG tablet Take 40 mg by mouth daily.  ? terbinafine (LAMISIL) 250 MG tablet Take 1 tablet (250 mg total) by mouth daily. (Patient taking differently: Take 250 mg by mouth every other day.)  ?  ? ?Allergies:   Codeine and Lipitor [atorvastatin]  ? ?Social History  ? ?Socioeconomic History  ? Marital status: Married  ?  Spouse name: Not on file  ? Number of children: 1  ? Years of education: 45  ? Highest education level: Not on file  ?Occupational History  ? Occupation: Proofreader  ? Occupation: Retired  ?Tobacco Use  ? Smoking status: Never  ? Smokeless tobacco: Never  ?Vaping Use  ? Vaping Use: Never used  ?Substance and Sexual Activity  ? Alcohol use: Yes  ?  Alcohol/week: 0.0 standard drinks  ?  Comment: 2+ per week  ? Drug use: No  ? Sexual activity: Not on file  ?Other Topics Concern  ? Not on file  ?Social History Narrative  ? Not on file  ? ?Social Determinants of Health  ? ?Financial Resource Strain: Not on file  ?Food Insecurity: Not on file  ?Transportation Needs: Not on file  ?Physical Activity: Not on file  ?Stress: Not on file  ?Social Connections: Not on file  ?  ?Social: History of playing [employer], [facility] at [facility] ? ?Family History: ?The patient's family history includes CVA in his brother, brother, brother, and father; Heart disease in his father. There is no history of Colon polyps, Colon cancer, Esophageal cancer, Stomach cancer, Pancreatic cancer, or Liver disease. ? ?ROS:   ?Please see the history of present illness.    ? All other systems reviewed and are negative. ? ?EKGs/Labs/Other  Studies Reviewed:   ? ?The following studies were reviewed today: ? ?EKG:   ?08/25/21: SR rate 83 No PVCs ?03/16/21: SR 1st heart block  ? ?Transthoracic Echocardiogram: ?Date: 04/20/2016 ?Results: ?Study Conclusions  ? ?- Left ventricle: The cavity size was normal. Wall thickness was  ?  normal. Systolic function was mildly to moderately reduced. The  ?  estimated ejection fraction was in the range of 40% to 45%.  ?  Diffuse hypokinesis. Doppler parameters are consistent with  ?  abnormal left ventricular relaxation (grade 1 diastolic  ?  dysfunction).  ?- Aortic valve: Trileaflet; mildly thickened, mildly calcified  ?  leaflets. There was trivial regurgitation.  ?- Mitral valve: There  was mild regurgitation.  ? ?Carotid Vascular Duplex: ?Date: 01/23/2016 ?Results: ?Bilateral: mild mixed plaque origin ICA. Mild soft plaque proximal  ?ICA and ECA. Vertebral artery flow is antegrade. 1-39% ICA  ?plaquing. Left ECA velocities are elevated.  ? ?Left/Right Heart Catheterizations: ?Date: 01/20/2016 ?Results: ?Mid RCA lesion, 10 %stenosed. ?Ost 1st Mrg to 1st Mrg lesion, 20 %stenosed. ?2nd Mrg lesion, 20 %stenosed. ?Mid Cx lesion, 10 %stenosed. ?Prox Cx to Mid Cx lesion, 10 %stenosed. ?Ost LM to LM lesion, 30 %stenosed. ?Ost 1st Diag to 1st Diag lesion, 100 %stenosed. ?A STENT PROMUS PREM MR 3.5X16 drug eluting stent was successfully placed. ?Prox LAD lesion, 100 %stenosed. ?Post intervention, there is a 0% residual stenosis. ?Mid LAD lesion, 20 %stenosed. ?There is moderate left ventricular systolic dysfunction. ?LV end diastolic pressure is mildly elevated. ?The left ventricular ejection fraction is 25-35% by visual estimate. ?There is no mitral valve regurgitation. ?  ?1. Acute anterior STEMI secondary to occluded proximal LAD ?2. Successful PTCA/DES x 1 proximal to mid LAD ?3. Mild disease in the left main, RCA and Circumflex ?4. Moderate segmental LV systolic dysfunction ?  ?Recommendations: Will continue DAPT for one  year with ASA and Brilinta. Will start beta blocker and change statin to Crestor. Will continue Ace-inh. Echo in before discharge.  ? ?Recent Labs: ?01/26/2021: ALT 23 ?04/16/2021: Magnesium 2.1 ?12/6/2

## 2021-08-25 ENCOUNTER — Other Ambulatory Visit: Payer: Self-pay

## 2021-08-25 ENCOUNTER — Encounter (HOSPITAL_COMMUNITY)
Admission: RE | Admit: 2021-08-25 | Discharge: 2021-08-25 | Disposition: A | Discharge status: Home / Self Care | Payer: PPO | Source: Ambulatory Visit | Attending: Internal Medicine | Admitting: Internal Medicine

## 2021-08-25 ENCOUNTER — Ambulatory Visit: Payer: PPO | Admitting: Internal Medicine

## 2021-08-25 ENCOUNTER — Encounter: Payer: Self-pay | Admitting: Internal Medicine

## 2021-08-25 VITALS — BP 127/90 | HR 83 | Ht 70.0 in | Wt 221.0 lb

## 2021-08-25 DIAGNOSIS — I2510 Atherosclerotic heart disease of native coronary artery without angina pectoris: Secondary | ICD-10-CM | POA: Diagnosis not present

## 2021-08-25 DIAGNOSIS — Z952 Presence of prosthetic heart valve: Secondary | ICD-10-CM

## 2021-08-25 DIAGNOSIS — I10 Essential (primary) hypertension: Secondary | ICD-10-CM | POA: Diagnosis not present

## 2021-08-25 DIAGNOSIS — I712 Thoracic aortic aneurysm, without rupture, unspecified: Secondary | ICD-10-CM | POA: Diagnosis not present

## 2021-08-25 DIAGNOSIS — Z951 Presence of aortocoronary bypass graft: Secondary | ICD-10-CM

## 2021-08-25 DIAGNOSIS — I493 Ventricular premature depolarization: Secondary | ICD-10-CM | POA: Diagnosis not present

## 2021-08-25 MED ORDER — ASPIRIN EC 81 MG PO TBEC: 81.0000 mg | DELAYED_RELEASE_TABLET | Freq: Every day | ORAL | 3 refills | Status: AC

## 2021-08-25 MED ORDER — NITROGLYCERIN 0.4 MG SL SUBL: 0.4000 mg | SUBLINGUAL_TABLET | SUBLINGUAL | 3 refills | Status: DC | PRN

## 2021-08-25 MED ORDER — METOPROLOL SUCCINATE ER 100 MG PO TB24: 100.0000 mg | ORAL_TABLET | Freq: Every day | ORAL | 3 refills | Status: DC

## 2021-08-25 NOTE — Patient Instructions (Signed)
Medication Instructions:  ?Your physician has recommended you make the following change in your medication:  ?DECREASE: Aspirin to 81 mg by mouth daily ?INCREASE: metoprolol succinate to 100 mg by mouth daily ?REFILLED: nitroglycering ?*If you need a refill on your cardiac medications before your next appointment, please call your pharmacy* ? ? ?Lab Work: ?NONE ?If you have labs (blood work) drawn today and your tests are completely normal, you will receive your results only by: ?MyChart Message (if you have MyChart) OR ?A paper copy in the mail ?If you have any lab test that is abnormal or we need to change your treatment, we will call you to review the results. ? ? ?Testing/Procedures: ?Your physician has requested that you have an echocardiogram in Early November. Echocardiography is a painless test that uses sound waves to create images of your heart. It provides your doctor with information about the size and shape of your heart and how well your heart?s chambers and valves are working. This procedure takes approximately one hour. There are no restrictions for this procedure. ? ? ? ?Follow-Up: ?At [facility], you and your health needs are our priority.  As part of our continuing mission to provide you with exceptional heart care, we have created designated Provider Care Teams.  These Care Teams include your primary Cardiologist (physician) and Advanced Practice Providers (APPs -  Physician Assistants and Nurse Practitioners) who all work together to provide you with the care you need, when you need it. ? ?We recommend signing up for the patient portal called "MyChart".  Sign up information is provided on this After Visit Summary.  MyChart is used to connect with patients for Virtual Visits (Telemedicine).  Patients are able to view lab/test results, encounter notes, upcoming appointments, etc.  Non-urgent messages can be sent to your provider as well.   ?To learn more about what you can do with MyChart, go  to [email or web address].   ? ?Your next appointment:   ?8 month(s) ? ?The format for your next appointment:   ?In Person ? ?Provider:   ?Werner Lean, MD   ? ?

## 2021-08-27 ENCOUNTER — Encounter (HOSPITAL_COMMUNITY): Payer: PPO

## 2021-08-30 ENCOUNTER — Encounter (HOSPITAL_COMMUNITY)
Admission: RE | Admit: 2021-08-30 | Discharge: 2021-08-30 | Disposition: A | Discharge status: Home / Self Care | Payer: PPO | Source: Ambulatory Visit | Attending: Internal Medicine | Admitting: Internal Medicine

## 2021-08-30 DIAGNOSIS — Z951 Presence of aortocoronary bypass graft: Secondary | ICD-10-CM | POA: Diagnosis not present

## 2021-08-30 DIAGNOSIS — Z953 Presence of xenogenic heart valve: Secondary | ICD-10-CM | POA: Insufficient documentation

## 2021-08-30 DIAGNOSIS — Z952 Presence of prosthetic heart valve: Secondary | ICD-10-CM

## 2021-09-01 ENCOUNTER — Encounter (HOSPITAL_COMMUNITY)
Admission: RE | Admit: 2021-09-01 | Discharge: 2021-09-01 | Disposition: A | Discharge status: Home / Self Care | Payer: PPO | Source: Ambulatory Visit | Attending: Internal Medicine | Admitting: Internal Medicine

## 2021-09-01 DIAGNOSIS — Z951 Presence of aortocoronary bypass graft: Secondary | ICD-10-CM

## 2021-09-01 DIAGNOSIS — Z952 Presence of prosthetic heart valve: Secondary | ICD-10-CM

## 2021-09-02 ENCOUNTER — Ambulatory Visit: Payer: PPO | Admitting: Podiatry

## 2021-09-02 ENCOUNTER — Encounter: Payer: Self-pay | Admitting: Podiatry

## 2021-09-02 DIAGNOSIS — L603 Nail dystrophy: Secondary | ICD-10-CM | POA: Diagnosis not present

## 2021-09-02 MED ORDER — TERBINAFINE HCL 250 MG PO TABS: 250.0000 mg | ORAL_TABLET | Freq: Every day | ORAL | 0 refills | Status: DC

## 2021-09-02 NOTE — Progress Notes (Signed)
He presents today for follow-up of nail fungus he is completed his first every other day dosing of the Lamisil.  Denies any problems with that.  States he has not started any new medications.  He states that they are getting there as he feels that they may be about 75% grown out.  He is lacking the results so far. ? ?Objective: Vital signs stable alert oriented x3 pulses are palpable no open lesions or wounds his nail plates do demonstrate hallux bilateral about 75 to 80% outgrowth at this point. ? ?Assessment: Well-healing onychomycosis with use of Lamisil long-term therapy. ? ?Plan: Were going to provide him another every other day dose of Lamisil. ?

## 2021-09-03 ENCOUNTER — Encounter (HOSPITAL_COMMUNITY)
Admission: RE | Admit: 2021-09-03 | Discharge: 2021-09-03 | Disposition: A | Discharge status: Home / Self Care | Payer: PPO | Source: Ambulatory Visit | Attending: Internal Medicine | Admitting: Internal Medicine

## 2021-09-03 VITALS — Ht 70.0 in | Wt 221.1 lb

## 2021-09-03 DIAGNOSIS — Z952 Presence of prosthetic heart valve: Secondary | ICD-10-CM

## 2021-09-03 DIAGNOSIS — Z951 Presence of aortocoronary bypass graft: Secondary | ICD-10-CM | POA: Diagnosis not present

## 2021-09-06 ENCOUNTER — Encounter (HOSPITAL_COMMUNITY)
Admission: RE | Admit: 2021-09-06 | Discharge: 2021-09-06 | Disposition: A | Discharge status: Home / Self Care | Payer: PPO | Source: Ambulatory Visit | Attending: Internal Medicine | Admitting: Internal Medicine

## 2021-09-06 DIAGNOSIS — Z951 Presence of aortocoronary bypass graft: Secondary | ICD-10-CM | POA: Diagnosis not present

## 2021-09-06 DIAGNOSIS — Z952 Presence of prosthetic heart valve: Secondary | ICD-10-CM

## 2021-09-06 NOTE — Progress Notes (Signed)
Occasional PVC's with one triplet noted today during exercise. Patient asymptomatic. Rate 120.  VSS. Will notify Dr Gasper Sells via message.Will continue to monitor the patient throughout  the program.Melis Trochez Venetia Maxon, RN,BSN ?09/06/2021 9:33 AM  ?

## 2021-09-07 DIAGNOSIS — I10 Essential (primary) hypertension: Secondary | ICD-10-CM | POA: Diagnosis not present

## 2021-09-07 DIAGNOSIS — E7800 Pure hypercholesterolemia, unspecified: Secondary | ICD-10-CM | POA: Diagnosis not present

## 2021-09-07 DIAGNOSIS — I208 Other forms of angina pectoris: Secondary | ICD-10-CM | POA: Diagnosis not present

## 2021-09-07 DIAGNOSIS — I2510 Atherosclerotic heart disease of native coronary artery without angina pectoris: Secondary | ICD-10-CM | POA: Diagnosis not present

## 2021-09-07 DIAGNOSIS — Z125 Encounter for screening for malignant neoplasm of prostate: Secondary | ICD-10-CM | POA: Diagnosis not present

## 2021-09-07 DIAGNOSIS — Z1389 Encounter for screening for other disorder: Secondary | ICD-10-CM | POA: Diagnosis not present

## 2021-09-07 DIAGNOSIS — Z0000 Encounter for general adult medical examination without abnormal findings: Secondary | ICD-10-CM | POA: Diagnosis not present

## 2021-09-07 DIAGNOSIS — R7309 Other abnormal glucose: Secondary | ICD-10-CM | POA: Diagnosis not present

## 2021-09-08 ENCOUNTER — Encounter (HOSPITAL_COMMUNITY)
Admission: RE | Admit: 2021-09-08 | Discharge: 2021-09-08 | Disposition: A | Discharge status: Home / Self Care | Payer: PPO | Source: Ambulatory Visit | Attending: Internal Medicine | Admitting: Internal Medicine

## 2021-09-08 DIAGNOSIS — Z952 Presence of prosthetic heart valve: Secondary | ICD-10-CM

## 2021-09-08 DIAGNOSIS — Z951 Presence of aortocoronary bypass graft: Secondary | ICD-10-CM | POA: Diagnosis not present

## 2021-09-08 NOTE — Progress Notes (Signed)
Discharge Progress Report ? ?Patient Details  ?Name: Dwayne [name] ?MRN: [id number] ?Date of Birth: 08/31/1955 ?Referring Provider:   ?Flowsheet Row CARDIAC REHAB PHASE II ORIENTATION from 07/15/2021 in [place]  ?Referring Provider Dwayne Haskell, MD  ? ?  ? ? ? ?Number of Visits: 18 ? ?Reason for Discharge:  ?Patient reached a stable level of exercise. ?Patient independent in their exercise. ?Patient has met program and personal goals. ? ?Smoking History:  ?Social History  ? ?Tobacco Use  ?Smoking Status Never  ?Smokeless Tobacco Never  ? ? ?Diagnosis:  ?04/15/21 S/P CABG x 3 ? ?04/15/21 S/P AVR (aortic valve replacement) ? ?ADL [facility]: ? ? ?Initial Exercise Prescription: ? Initial Exercise Prescription - 07/15/21 1000   ? ?  ? Date of Initial Exercise RX and Referring Provider  ? Date 07/15/21   ? Referring Provider Dwayne Haskell, MD   ? Expected Discharge Date 09/10/21   ?  ? NuStep  ? Level 3   ? SPM 75   ? Minutes 15   ? METs 2.5   ?  ? Arm Ergometer  ? Level 2   ? RPM 60   ? Minutes 15   ? METs 2.8   ?  ? Prescription Details  ? Frequency (times per week) 3   ? Duration Progress to 30 minutes of continuous aerobic without signs/symptoms of physical distress   ?  ? Intensity  ? THRR 40-80% of Max Heartrate 61-122   ? Ratings of Perceived Exertion 11-13   ? Perceived Dyspnea 0-4   ?  ? Progression  ? Progression Continue progressive overload as per policy without signs/symptoms or physical distress.   ?  ? Resistance Training  ? Training Prescription Yes   ? Weight 4 lbs   ? Reps 10-15   ? ?  ?  ? ?  ? ? ?Discharge Exercise Prescription (Final Exercise Prescription Changes): ? Exercise Prescription Changes - 09/08/21 1000   ? ?  ? Response to Exercise  ? Blood Pressure (Admit) 122/84   ? Blood Pressure (Exercise) 142/82   ? Blood Pressure (Exit) 122/78   ? Heart Rate (Admit) 61 bpm   ? Heart Rate (Exercise) 106 bpm   ? Heart Rate (Exit) 67 bpm   ? Rating of Perceived  Exertion (Exercise) 9   ? Symptoms None   ? Comments Pt graduated from the [place] program today   ? Duration Continue with 30 min of aerobic exercise without signs/symptoms of physical distress.   ? Intensity THRR unchanged   ?  ? Progression  ? Progression Continue to progress workloads to maintain intensity without signs/symptoms of physical distress.   ? Average METs 3.1   ?  ? Resistance Training  ? Training Prescription No   ? Weight No weights on Wednesdays   ?  ? Interval Training  ? Interval Training No   ?  ? NuStep  ? Level 6   ? Minutes 15   ? METs 3.1   ?  ? Arm Ergometer  ? Level 4   ? Minutes 15   ? METs 3.1   ?  ? Home Exercise Plan  ? Plans to continue exercise at Home (comment)   ? Frequency Add 3 additional days to program exercise sessions.   ? Initial Home Exercises Provided 08/13/21   ? ?  ?  ? ?  ? ? ?Functional Capacity: ? 6 Minute Walk   ? ? Row  Name 07/15/21 1015 09/03/21 1202  ?  ?  ? 6 Minute Walk  ? Phase Initial Discharge   ? Distance 1503 feet 1757 feet   ? Distance % Change -- 16.9 %   ? Distance Feet Change -- 254 ft   ? Walk Time 6 minutes 6 minutes   ? # of Rest Breaks 0 0   ? MPH 2.85 3.3   ? METS 3.36 3.8   ? RPE 9 8   ? Perceived Dyspnea  0 0   ? VO2 Peak 11.75 13.3   ? Symptoms No Yes (comment)   ? Comments -- 2-3/10 pian right hip, chronic   ? Resting HR 70 bpm 65 bpm   ? Resting BP 132/94 122/74   ? Resting Oxygen Saturation  97 % --   ? Exercise Oxygen Saturation  during 6 min walk 98 % --   ? Max Ex. HR 104 bpm 107 bpm   ? Max Ex. BP 142/92 142/84   ? 2 Minute Post BP 130/88 --   ? ?  ?  ? ?  ? ? ?Psychological, QOL, Others - Outcomes: ?PHQ 2/9: ? ?  09/08/2021  ?  9:07 AM 07/15/2021  ?  8:14 AM  ?Depression screen PHQ 2/9  ?Decreased Interest 0 0  ?Down, Depressed, Hopeless 0 0  ?PHQ - 2 Score 0 0  ? ? ?Quality of Life: ? Quality of Life - 09/03/21 1205   ? ?  ? Quality of Life  ? Select Quality of Life   ?  ? Quality of Life Scores  ? Health/Function Pre 27.07 %   ?  Health/Function Post 25.6 %   ? Health/Function % Change -5.43 %   ? Socioeconomic Pre 25.08 %   ? Socioeconomic Post 24.5 %   ? Socioeconomic % Change  -2.31 %   ? Psych/Spiritual Pre 25.21 %   ? Psych/Spiritual Post 21.14 %   ? Psych/Spiritual % Change -16.14 %   ? Family Pre 28.8 %   ? Family Post 26.4 %   ? Family % Change -8.33 %   ? GLOBAL Pre 26.58 %   ? GLOBAL Post 24.58 %   ? GLOBAL % Change -7.52 %   ? ?  ?  ? ?  ? ? ?Personal Goals: ?Goals established at orientation with interventions provided to work toward goal. ? Personal Goals and Risk Factors at Admission - 07/15/21 1014   ? ?  ? Core Components/Risk Factors/Patient Goals on Admission  ?  Weight Management Yes;Weight Maintenance   ? Intervention Weight Management: Develop a combined nutrition and exercise program designed to reach desired caloric intake, while maintaining appropriate intake of nutrient and fiber, sodium and fats, and appropriate energy expenditure required for the weight goal.;Weight Management: Provide education and appropriate resources to help participant work on and attain dietary goals.;Weight Management/Obesity: Establish reasonable short term and long term weight goals.   ? Expected Outcomes Short Term: Continue to assess and modify interventions until short term weight is achieved;Long Term: Adherence to nutrition and physical activity/exercise program aimed toward attainment of established weight goal;Understanding of distribution of calorie intake throughout the day with the consumption of 4-5 meals/snacks   ? Hypertension Yes   ? Intervention Provide education on lifestyle modifcations including regular physical activity/exercise, weight management, moderate sodium restriction and increased consumption of fresh fruit, vegetables, and low fat dairy, alcohol moderation, and smoking cessation.;Monitor prescription use compliance.   ? Expected Outcomes Short  Term: Continued assessment and intervention until BP is < 140/52m HG  in hypertensive participants. < 130/851mHG in hypertensive participants with diabetes, heart failure or chronic kidney disease.;Long Term: Maintenance of blood pressure at goal levels.   ? Lipids Yes   ? Intervention Provide education and support for participant on nutrition & aerobic/resistive exercise along with prescribed medications to achieve LDL <7048mHDL >61m77m ? Expected Outcomes Short Term: Participant states understanding of desired cholesterol values and is compliant with medications prescribed. Participant is following exercise prescription and nutrition guidelines.;Long Term: Cholesterol controlled with medications as prescribed, with individualized exercise RX and with personalized nutrition plan. Value goals: LDL < 70mg60mL > 40 mg.   ? ?  ?  ? ?  ?  ? ?Personal Goals Discharge: ? Goals and Risk Factor Review   ? ? Row [place] 07/26/21 0918 [id number]  ?  ?  ?  ?  ? Core Components/Risk Factors/Patient Goals Review  ? Personal Goals Review Weight Management/Obesity;Lipids;Hypertension Weight Management/Obesity;Lipids;Hypertension     ? Review Dwayne Marya Amslerted cardiac rehab on 07/26/21. Dwayne Marya Amslerwell with exercise. VSS as Dwayne's lisinopril was increased by his cardiologist. Dwayne Marya Amslerbeen doing well with exercise at cardiac rehab. Diastolic BP's remain in the mid 80's- 90's at times. Will forward to Dr ChandGasper Sellsreview.     ? Expected Outcomes Dwayne [name] continue to participate in phase 2 cardiac rehab for exercise, nutrition and lifestyle modifications Dwayne [name] continue to participate in phase 2 cardiac rehab for exercise, nutrition and lifestyle modifications     ? ?  ?  ? ?  ? ? ?Exercise Goals and Review: ? Exercise Goals   ? ? Row [place] 07/15/21 1017  ?  ?  ?  ?  ?  ? Exercise Goals  ? Increase Physical Activity Yes      ? Intervention Provide advice, education, support and counseling about physical activity/exercise needs.;Develop an individualized exercise prescription for aerobic and  resistive training based on initial evaluation findings, risk stratification, comorbidities and participant's personal goals.      ? Expected Outcomes Short Term: Attend rehab on a regular basis to increase amou

## 2021-09-10 ENCOUNTER — Encounter (HOSPITAL_COMMUNITY): Payer: PPO

## 2021-12-02 ENCOUNTER — Ambulatory Visit: Payer: PPO | Admitting: Podiatry

## 2021-12-02 ENCOUNTER — Encounter: Payer: Self-pay | Admitting: Podiatry

## 2021-12-02 DIAGNOSIS — L603 Nail dystrophy: Secondary | ICD-10-CM

## 2021-12-02 DIAGNOSIS — Z79899 Other long term (current) drug therapy: Secondary | ICD-10-CM | POA: Diagnosis not present

## 2021-12-02 MED ORDER — TERBINAFINE HCL 250 MG PO TABS: 250.0000 mg | ORAL_TABLET | Freq: Every day | ORAL | 0 refills | Status: DC

## 2021-12-02 NOTE — Progress Notes (Signed)
He presents today for follow-up of his nail fungus he is completed to every other day doses and is about 80 to 85% grown out.  He is very happy with the outcome denies fever chills nausea vomit muscle aches pains calf pain back pain chest pain shortness of breath or rashes or itching.  Objective: Nailplates are clearing 80 to 85%.  Assessment: Long-term therapy with Lamisil for onychomycosis.  Plan: We will start him on his third every other day dose to get this to clear out.

## 2022-03-03 ENCOUNTER — Ambulatory Visit: Payer: PPO | Admitting: Podiatry

## 2022-03-03 DIAGNOSIS — Z79899 Other long term (current) drug therapy: Secondary | ICD-10-CM

## 2022-03-03 MED ORDER — TERBINAFINE HCL 250 MG PO TABS: 250.0000 mg | ORAL_TABLET | Freq: Every day | ORAL | 0 refills | Status: DC

## 2022-03-03 NOTE — Progress Notes (Signed)
He presents today for follow-up of his every other day dosing of his Lamisil.  He states that almost there there is still some fungus present but I am doing really well taking the medicine and no complaints.  Objective: Vital signs are stable he is alert and oriented x3.  Pulses are palpable.  His nails have grown out by almost 90 to 95%.  They look very good at this point.  Assessment: Well-healing onychomycosis long-term therapy with Lamisil.  Plan: Ongoing encouraged him to take 1 more 60-day dose he will take 1 tablet every other day.  Follow-up with him in 3 months.

## 2022-03-30 ENCOUNTER — Ambulatory Visit (HOSPITAL_COMMUNITY): Payer: PPO | Attending: Internal Medicine

## 2022-03-30 DIAGNOSIS — I712 Thoracic aortic aneurysm, without rupture, unspecified: Secondary | ICD-10-CM | POA: Diagnosis not present

## 2022-03-30 DIAGNOSIS — I10 Essential (primary) hypertension: Secondary | ICD-10-CM | POA: Diagnosis not present

## 2022-03-30 DIAGNOSIS — I493 Ventricular premature depolarization: Secondary | ICD-10-CM | POA: Diagnosis not present

## 2022-03-30 LAB — ECHOCARDIOGRAM COMPLETE
AR max vel: 1.73 cm2
AV Area VTI: 1.7 cm2
AV Area mean vel: 1.92 cm2
AV Mean grad: 9 mmHg
AV Peak grad: 18.5 mmHg
Ao pk vel: 2.15 m/s
Area-P 1/2: 3.06 cm2
S' Lateral: 3.9 cm

## 2022-03-31 ENCOUNTER — Telehealth: Payer: Self-pay | Admitting: Internal Medicine

## 2022-03-31 DIAGNOSIS — I712 Thoracic aortic aneurysm, without rupture, unspecified: Secondary | ICD-10-CM

## 2022-03-31 DIAGNOSIS — Z79899 Other long term (current) drug therapy: Secondary | ICD-10-CM

## 2022-03-31 DIAGNOSIS — I5042 Chronic combined systolic (congestive) and diastolic (congestive) heart failure: Secondary | ICD-10-CM

## 2022-03-31 DIAGNOSIS — Z952 Presence of prosthetic heart valve: Secondary | ICD-10-CM

## 2022-03-31 MED ORDER — LISINOPRIL 20 MG PO TABS: 20.0000 mg | ORAL_TABLET | Freq: Every day | ORAL | 3 refills | Status: DC

## 2022-03-31 NOTE — Telephone Encounter (Signed)
-----   Message from Werner Lean, MD sent at 03/30/2022  3:50 PM EDT ----- BP elevated LVEF similar Aortic root similar Valve OK - echo in one year -ambulatory BP monitoring, If DBP 90+, increase lisinopril to 20 mg and BMP in two weeks

## 2022-03-31 NOTE — Telephone Encounter (Signed)
Pt is returning call. Transferred to [name], [name]

## 2022-03-31 NOTE — Telephone Encounter (Signed)
Left details on voicemail box per DPR. Dr Gasper Sells also posted to his MyChart. Order placed at this time for repeat ECHO, BMET in 2 weeks (scheduled for 04/14/22) , and Lisinopril '20mg'$  sent to pharmacy on file.

## 2022-03-31 NOTE — Telephone Encounter (Signed)
Pt returned call to office with wife on speakerphone. Had many questions about current and prior ECHO. Was questioning a lot on which valve he had replaced, when his prior STEMI was and what coronary artery was stented, and why mitral valve regurgitation is worsening. Answered all questions as thoroughly as possible. Patient and wife state they have not been checking BP at home, didn't know they were supposed to and didn't think his DBP had been 90+. Advised her that the 3 last readings in the chart all show 90 or above and asked that she check his BP on phone. DBP was 93. Wife states they will increase the Lisinopril as directed, will begin to routinely monitor his pressure, and will do labs on 11/16 as scheduled. No further questions.

## 2022-04-14 ENCOUNTER — Ambulatory Visit: Payer: PPO | Attending: Internal Medicine

## 2022-04-14 DIAGNOSIS — Z952 Presence of prosthetic heart valve: Secondary | ICD-10-CM | POA: Diagnosis not present

## 2022-04-14 DIAGNOSIS — Z79899 Other long term (current) drug therapy: Secondary | ICD-10-CM | POA: Diagnosis not present

## 2022-04-14 DIAGNOSIS — I712 Thoracic aortic aneurysm, without rupture, unspecified: Secondary | ICD-10-CM

## 2022-04-14 DIAGNOSIS — I5042 Chronic combined systolic (congestive) and diastolic (congestive) heart failure: Secondary | ICD-10-CM | POA: Diagnosis not present

## 2022-04-14 LAB — BASIC METABOLIC PANEL
BUN/Creatinine Ratio: 13 (ref 10–24)
BUN: 11 mg/dL (ref 8–27)
CO2: 21 mmol/L (ref 20–29)
Calcium: 9 mg/dL (ref 8.6–10.2)
Chloride: 102 mmol/L (ref 96–106)
Creatinine, Ser: 0.87 mg/dL (ref 0.76–1.27)
Glucose: 122 mg/dL — ABNORMAL HIGH (ref 70–99)
Potassium: 4.1 mmol/L (ref 3.5–5.2)
Sodium: 136 mmol/L (ref 134–144)
eGFR: 94 mL/min/{1.73_m2} (ref >59)

## 2022-06-02 ENCOUNTER — Ambulatory Visit: Payer: PPO | Admitting: Podiatry

## 2022-06-29 NOTE — Progress Notes (Unsigned)
Cardiology Office Note:    Date:  06/30/2022   ID:  [name], DOB 10-09-1955, MRN [id number]  PCP:  Gaynelle Arabian, MD Pronunciation: Per Mar'   [facility] Providers Cardiologist:  Werner Lean, MD     Referring MD: Gaynelle Arabian, MD   CC:  follow up CABG  History of Present Illness:    Dwayne [name] is a 67 y.o. male with a hx of CAD s/p prior PCI (LAD STEMI 2017), Ischemic Cardiomyopathy EF 25-30%-> 40-45%, Prior CVA in 2017 with mild CAS, HTN and HLD seen 03/17/21.  Had hip surgery without issue.   Seen in 2022: Found to have multivessel CAD with 80% LM stenosis warranting CABG. His echo showed EF 45%, mild-moderate AI with mild dilation of aortic root. Intra-op TEE confirmed moderate AI with mild dilation of aortic root. He underwent CABG x3 with LIMA to LAD, reverse saphenous vein graft to obtuse marginal, reverse saphenous vein graft to first diagonal, AVR with 27 mm Inspiris Resilia tissue valve. 2023: completed cardiac rehab without issues.  Patient notes that he is doing well.   Did a full [place] season. There are no interval hospital/ED visit.   He is remodeling his bathroom.  No chest pain or pressure .  No SOB/DOE and no PND/Orthopnea.  No weight gain or leg swelling.  No palpitations or syncope . No nitrogylcerin.      Past Medical History:  Diagnosis Date   Acute ST elevation myocardial infarction (STEMI) involving left anterior descending (LAD) coronary artery (HCC)    Adenomatous colon polyp 2000   Aortic insufficiency    Arthritis    Carotid stenosis    Coronary artery disease involving native coronary artery of native heart without angina pectoris 03/23/2016   Dilated aortic root (HCC)    Diverticulosis    Erectile dysfunction    History of stroke 01/23/2017   Hypercholesteremia    Hypertension    Internal hemorrhoids    Kidney stones    Myocardial infarction [facility])    Occipital cerebral infarction ([place]) 03/23/2016   S/P AVR     Splenomegaly     Past Surgical History:  Procedure Laterality Date   AORTIC VALVE REPLACEMENT N/A 04/15/2021   Procedure: AORTIC VALVE REPLACEMENT (AVR) USING 27 MM INSPIRIS RESILIA  AORTIC VALVE;  Surgeon: Lajuana Matte, MD;  Location: [place];  Service: Open Heart Surgery;  Laterality: N/A;   CARDIAC CATHETERIZATION N/A 01/20/2016   Procedure: Left Heart Cath and Coronary Angiography;  Surgeon: Burnell Blanks, MD; LAD 100%, D1 100%, multi-vessel dz 10-30% in other vessels, EF 25-35%, elevated LVEDP   CARDIAC CATHETERIZATION N/A 01/20/2016   Procedure: Coronary Stent Intervention;  Surgeon: Burnell Blanks, MD; PROMUS PREM MR 3.5X16 mm DES LAD, D1 jailed by the stent but was already occluded   CHOLECYSTECTOMY  2009   COLONOSCOPY     CORONARY ARTERY BYPASS GRAFT N/A 04/15/2021   Procedure: CORONARY ARTERY BYPASS GRAFTING (CABG) x 3 ON CARDIOPULMONARY BYPASS;  Surgeon: Lajuana Matte, MD;  Location: [place];  Service: Open Heart Surgery;  Laterality: N/A;   Flow trac   ENDOVEIN HARVEST OF GREATER SAPHENOUS VEIN Right 04/15/2021   Procedure: ENDOVEIN HARVEST OF GREATER SAPHENOUS VEIN;  Surgeon: Lajuana Matte, MD;  Location: [place];  Service: Open Heart Surgery;  Laterality: Right;   EYE SURGERY     Age 67   LEFT HEART CATH AND CORONARY ANGIOGRAPHY N/A 04/13/2021   Procedure: LEFT HEART CATH  AND CORONARY ANGIOGRAPHY;  Surgeon: Troy Sine, MD;  Location: [place] [facility];  Service: Cardiovascular;  Laterality: N/A;   [place]  2008   SHOULDER SURGERY  1998   Left shoulder fracture-clavicle   WRIST FRACTURE SURGERY  1995   Bilateral    Current Medications: Current Meds  Medication Sig   acetaminophen (TYLENOL) 500 MG tablet Take 500 mg by mouth every 6 (six) hours as needed for mild pain.   aspirin EC 81 MG tablet Take 1 tablet (81 mg total) by mouth daily. Swallow whole.   lisinopril (ZESTRIL) 20 MG tablet Take  1 tablet (20 mg total) by mouth daily.   metoprolol succinate (TOPROL-XL) 100 MG 24 hr tablet Take 1 tablet (100 mg total) by mouth daily. Take with or immediately following a meal.   Multiple Vitamin (MULTIVITAMIN WITH MINERALS) TABS tablet Take 1 tablet by mouth daily.   rosuvastatin (CRESTOR) 40 MG tablet Take 40 mg by mouth daily.   terbinafine (LAMISIL) 250 MG tablet Take 1 tablet (250 mg total) by mouth daily.   [DISCONTINUED] nitroGLYCERIN (NITROSTAT) 0.4 MG SL tablet Place 1 tablet (0.4 mg total) under the tongue every 5 (five) minutes as needed.   [DISCONTINUED] terbinafine (LAMISIL) 250 MG tablet Take 1 tablet (250 mg total) by mouth daily.     Allergies:   Codeine and Lipitor [atorvastatin]   Social History   Socioeconomic History   Marital status: Married    Spouse name: Not on file   Number of children: 1   Years of education: 16   Highest education level: Not on file  Occupational History   Occupation: Proofreader   Occupation: Retired  Tobacco Use   Smoking status: Never   Smokeless tobacco: Never  Vaping Use   Vaping Use: Never used  Substance and Sexual Activity   Alcohol use: Yes    Alcohol/week: 0.0 standard drinks of alcohol    Comment: 2+ per week   Drug use: No   Sexual activity: Not on file  Other Topics Concern   Not on file  Social History Narrative   Not on file   Social Determinants of Health   Financial Resource Strain: Not on file  Food Insecurity: Not on file  Transportation Needs: Not on file  Physical Activity: Not on file  Stress: Not on file  Social Connections: Not on file    Social: History of playing [employer], Born at [facility], remodeling   Family History: The patient's family history includes CVA in his brother, brother, brother, and father; Heart disease in his father. There is no history of Colon polyps, Colon cancer, Esophageal cancer, Stomach cancer, Pancreatic cancer, or Liver disease.  ROS:   Please see the  history of present illness.     All other systems reviewed and are negative.  EKGs/Labs/Other Studies Reviewed:    The following studies were reviewed today:  EKG:   06/30/22: SR 1st HB 08/25/21: SR rate 83 No PVCs 03/16/21: SR 1st heart block   Cardiac Studies & Procedures   CARDIAC CATHETERIZATION  CARDIAC CATHETERIZATION 04/13/2021  Narrative   Ost LM to Dist LM lesion is 80% stenosed.   Prox LAD to Mid LAD lesion is 5% stenosed.   Prox LAD lesion is 30% stenosed.   1st Diag lesion is 90% stenosed.   Ost Cx to Prox Cx lesion is 5% stenosed.   2nd Mrg lesion is 15% stenosed.  Prox RCA lesion is 10% stenosed.  Since the 2017 acute catheterization there is now severe 80% eccentric left main stenosis with calcification superiorly. The proximal LAD stent is patent and the previously occluded jailed diagonal vessel is now a diminutive vessel.  There is 30% stenosis proximal to the stent and mild smooth 20% narrowing beyond the stented segment.  Non-obstructive mild circumflex and RCA stenosis with a dominant RCA and septal collateralization to the LAD.  EF by echo today was 45%.  LVEDP is increased at 28 mm.  RECOMMENDATION: Will heparinize 8 hours post TR band removal.  Will initiate nitrates and continue carvedilol.  Surgical consultation for CABG revascularization.  Findings Coronary Findings Diagnostic  Dominance: Right  Left Main Ost LM to Dist LM lesion is 80% stenosed.  Left Anterior Descending Prox LAD lesion is 30% stenosed. Prox LAD to Mid LAD lesion is 5% stenosed. The lesion was previously treated .  First Diagonal Branch Vessel is small in size. 1st Diag lesion is 90% stenosed.  First Septal Branch Collaterals 1st Sept filled by collaterals from [place].  Left Circumflex Ost Cx to Prox Cx lesion is 5% stenosed.  Second Obtuse Marginal Branch 2nd Mrg lesion is 15% stenosed.  Right Coronary Artery Prox RCA lesion is 10% stenosed.  Intervention  No  interventions have been documented.   CARDIAC CATHETERIZATION  CARDIAC CATHETERIZATION 01/20/2016  Narrative  Mid RCA lesion, 10 %stenosed.  Ost 1st Mrg to 1st Mrg lesion, 20 %stenosed.  2nd Mrg lesion, 20 %stenosed.  Mid Cx lesion, 10 %stenosed.  Prox Cx to Mid Cx lesion, 10 %stenosed.  Ost LM to LM lesion, 30 %stenosed.  Ost 1st Diag to 1st Diag lesion, 100 %stenosed.  A STENT PROMUS PREM MR 3.5X16 drug eluting stent was successfully placed.  Prox LAD lesion, 100 %stenosed.  Post intervention, there is a 0% residual stenosis.  Mid LAD lesion, 20 %stenosed.  There is moderate left ventricular systolic dysfunction.  LV end diastolic pressure is mildly elevated.  The left ventricular ejection fraction is 25-35% by visual estimate.  There is no mitral valve regurgitation.  1. Acute anterior STEMI secondary to occluded proximal LAD 2. Successful PTCA/DES x 1 proximal to mid LAD 3. Mild disease in the left main, RCA and Circumflex 4. Moderate segmental LV systolic dysfunction  Recommendations: Will continue DAPT for one year with ASA and Brilinta. Will start beta blocker and change statin to Crestor. Will continue Ace-inh. Echo in before discharge.  Findings Coronary Findings Diagnostic  Dominance: Right  Left Main The lesion is discrete.  Left Anterior Descending Vessel is large. The lesion is thrombotic.  First Diagonal Branch Vessel is small in size.  Second Diagonal Branch Vessel is large in size.  Third Diagonal Branch Vessel is small in size.  Left Circumflex  First Obtuse Marginal Branch Vessel is moderate in size. The lesion is discrete.  Second Obtuse Marginal Branch Vessel is moderate in size. The lesion is discrete.  Third Obtuse Marginal Branch Vessel is small in size.  Right Coronary Artery Vessel is large. The lesion is discrete.  Right Posterior Descending Artery Vessel is large in size.  Intervention  Prox LAD  lesion Angioplasty Lesion crossed with guidewire. Pre-stent angioplasty was performed using a BALLOON EMERGE MR 2.5X12. A STENT PROMUS PREM MR 3.5X16 drug eluting stent was successfully placed. Stent strut is well apposed. Post-stent angioplasty was performed using a BALLOON Gilman EUPHORA [id number]. There is no pre-interventional antegrade distal flow (TIMI 0).  The post-interventional  distal flow is normal (TIMI 3). The intervention was successful . No complications occurred at this lesion. There is a 0% residual stenosis post intervention.     ECHOCARDIOGRAM  ECHOCARDIOGRAM COMPLETE 03/30/2022  Narrative ECHOCARDIOGRAM REPORT    Patient Name:   ANSEN SAYEGH [facility] Date of Exam: 03/30/2022 Medical Rec #:  [id number]        Height:       70.0 in Accession #:    [id number]       Weight:       221.1 lb Date of Birth:  1956-05-11        BSA:          2.178 m Patient Age:    67 years         BP:           127/90 mmHg Patient Gender: M                HR:           73 bpm. Exam Location:  [place]  Procedure: 2D Echo, Cardiac Doppler and Color Doppler  Indications:    I71.20 Thoracic aortic aneurysm  History:        Patient has prior history of Echocardiogram examinations, most recent 05/28/2021. Previous Myocardial Infarction and CAD, Prior CABG, Thoracic aortic aneurysm, Arrythmias:PVC, Signs/Symptoms:S/p AVR (66m Inspiris Resilia; Risk Factors:Hypertension and Dyslipidemia. Aortic Valve: valve is present in the aortic position. Procedure Date: 04/15/2021.  Sonographer:    WCoralyn HellingRDCS Referring Phys: [id number] CenterA Keonna Raether  IMPRESSIONS   1. Prominent postop septum. LVEF 45-50% with global HK. Left ventricular ejection fraction, by estimation, is 45 to 50%. The left ventricle has mildly decreased function. The left ventricle demonstrates global hypokinesis. Left ventricular diastolic parameters are consistent with Grade I diastolic dysfunction (impaired  relaxation). 2. Right ventricular systolic function is normal. The right ventricular size is normal. There is normal pulmonary artery systolic pressure. The estimated right ventricular systolic pressure is 276.1mmHg. 3. The mitral valve is grossly normal. Mild mitral valve regurgitation. No evidence of mitral stenosis. 4. The aortic valve has been repaired/replaced. Aortic valve regurgitation is not visualized. There is a valve present in the aortic position. Procedure Date: 04/15/2021. Echo findings are consistent with normal structure and function of the aortic valve prosthesis. Aortic valve area, by VTI measures 1.70 cm. Aortic valve mean gradient measures 9.0 mmHg. Aortic valve Vmax measures 2.15 m/s. 5. Aortic dilatation noted. There is mild dilatation of the aortic root, measuring 40 mm. 6. The inferior vena cava is normal in size with greater than 50% respiratory variability, suggesting right atrial pressure of 3 mmHg.  Comparison(s): No significant change from prior study.  FINDINGS Left Ventricle: Prominent postop septum. LVEF 45-50% with global HK. Left ventricular ejection fraction, by estimation, is 45 to 50%. The left ventricle has mildly decreased function. The left ventricle demonstrates global hypokinesis. The left ventricular internal cavity size was normal in size. There is no left ventricular hypertrophy. Abnormal (paradoxical) septal motion consistent with post-operative status. Left ventricular diastolic parameters are consistent with Grade I diastolic dysfunction (impaired relaxation).  Right Ventricle: The right ventricular size is normal. No increase in right ventricular wall thickness. Right ventricular systolic function is normal. There is normal pulmonary artery systolic pressure. The tricuspid regurgitant velocity is 2.10 m/s, and with an assumed right atrial pressure of 3 mmHg, the estimated right ventricular systolic pressure is 295.0mmHg.  Left Atrium: Left atrial  size was  normal in size.  Right Atrium: Right atrial size was normal in size.  Pericardium: There is no evidence of pericardial effusion.  Mitral Valve: The mitral valve is grossly normal. Mild mitral valve regurgitation. No evidence of mitral valve stenosis.  Tricuspid Valve: The tricuspid valve is grossly normal. Tricuspid valve regurgitation is mild . No evidence of tricuspid stenosis.  Aortic Valve: The aortic valve has been repaired/replaced. Aortic valve regurgitation is not visualized. Aortic valve mean gradient measures 9.0 mmHg. Aortic valve peak gradient measures 18.5 mmHg. Aortic valve area, by VTI measures 1.70 cm. There is a valve present in the aortic position. Procedure Date: 04/15/2021. Echo findings are consistent with normal structure and function of the aortic valve prosthesis.  Pulmonic Valve: The pulmonic valve was grossly normal. Pulmonic valve regurgitation is trivial. No evidence of pulmonic stenosis.  Aorta: Aortic dilatation noted. There is mild dilatation of the aortic root, measuring 40 mm.  Venous: The right lower pulmonary vein is normal. The inferior vena cava is normal in size with greater than 50% respiratory variability, suggesting right atrial pressure of 3 mmHg.  IAS/Shunts: The atrial septum is grossly normal.   LEFT VENTRICLE PLAX 2D LVIDd:         5.10 cm   Diastology LVIDs:         3.90 cm   LV e' medial:    5.98 cm/s LV PW:         1.30 cm   LV E/e' medial:  11.7 LV IVS:        1.00 cm   LV e' lateral:   12.10 cm/s LVOT diam:     2.40 cm   LV E/e' lateral: 5.8 LV SV:         78 LV SV Index:   36 LVOT Area:     4.52 cm   RIGHT VENTRICLE            IVC RV S prime:     7.51 cm/s  IVC diam: 1.40 cm TAPSE (M-mode): 1.8 cm RVSP:           20.6 mmHg  LEFT ATRIUM             Index        RIGHT ATRIUM           Index LA diam:        3.90 cm 1.79 cm/m   RA Pressure: 3.00 mmHg LA Vol (A2C):   60.6 ml 27.82 ml/m  RA Area:     14.70 cm LA Vol  (A4C):   62.1 ml 28.51 ml/m  RA Volume:   37.50 ml  17.22 ml/m LA Biplane Vol: 62.8 ml 28.83 ml/m AORTIC VALVE AV Area (Vmax):    1.73 cm AV Area (Vmean):   1.92 cm AV Area (VTI):     1.70 cm AV Vmax:           215.00 cm/s AV Vmean:          138.000 cm/s AV VTI:            0.458 m AV Peak Grad:      18.5 mmHg AV Mean Grad:      9.0 mmHg LVOT Vmax:         82.20 cm/s LVOT Vmean:        58.600 cm/s LVOT VTI:          0.172 m LVOT/AV VTI ratio: 0.38  AORTA Ao Root diam: 4.00 cm Ao Asc  diam:  3.40 cm  MITRAL VALVE               TRICUSPID VALVE MV Area (PHT): 3.06 cm    TR Peak grad:   17.6 mmHg MV Decel Time: 248 msec    TR Vmax:        210.00 cm/s MV E velocity: 70.20 cm/s  Estimated RAP:  3.00 mmHg MV A velocity: 73.40 cm/s  RVSP:           20.6 mmHg MV E/A ratio:  0.96 SHUNTS Systemic VTI:  0.17 m Systemic Diam: 2.40 cm  Eleonore Chiquito MD Electronically signed by Eleonore Chiquito MD Signature Date/Time: 03/30/2022/10:33:28 AM    Final   TEE  ECHO INTRAOPERATIVE TEE 04/16/2021  Narrative *INTRAOPERATIVE TRANSESOPHAGEAL REPORT *    Patient Name:   GILES CURRIE [facility] Date of Exam: 04/15/2021 Medical Rec #:  [id number]        Height:       70.0 in Accession #:    [id number]       Weight:       224.0 lb Date of Birth:  [birth year]        BSA:          2.19 m Patient Age:    88 years         BP:           165/75 mmHg Patient Gender: M                HR:           80 bpm. Exam Location:  Anesthesiology  Transesophogeal exam was perform intraoperatively during surgical procedure. Patient was closely monitored under general anesthesia during the entirety of examination.  Indications:     CABG Performing Phys: [id number] HARRELL O LIGHTFOOT  Complications: No known complications during this procedure. POST-OP IMPRESSIONS Overall, there were no significant changes from pre-bypass. _ Left Ventricle: The left ventricle is unchanged from pre-bypass. _ Right Ventricle:  The right ventricle appears unchanged from pre-bypass. _ Aorta: The aorta appears unchanged from pre-bypass. _ Left Atrial Appendage: The left atrial appendage appears unchanged from pre-bypass. _ Aortic Valve: A bioprosthetic bioprosthetic valve was placed, leaflets are freely mobile Manufactured by; a Size; 51m. No regurgitation post repair. The gradient recorded across the prosthetic valve is within the expected range. No perivalvular leak noted. _ Mitral Valve: The mitral valve appears unchanged from pre-bypass. _ Tricuspid Valve: The tricuspid valve appears unchanged from pre-bypass. _ Pulmonic Valve: The pulmonic valve appears unchanged from pre-bypass. _ Interatrial Septum: The interatrial septum appears unchanged from pre-bypass. _ Comments: EF as before, 45-50% new aortic valve with all leaflets moving well.  PRE-OP FINDINGS Left Ventricle: The left ventricle has mildly reduced systolic function, with an ejection fraction of 45-50%. The cavity size was normal. There is no increase in left ventricular wall thickness. There is no left ventricular hypertrophy. Left ventricular diastolic parameters are consistent with Grade I diastolic dysfunction (impaired relaxation).   Right Ventricle: The right ventricle has normal systolic function. The cavity was normal. There is no increase in right ventricular wall thickness.  Left Atrium: Left atrial size was normal in size. No left atrial/left atrial appendage thrombus was detected. The left atrial appendage is well visualized and there is no evidence of thrombus present.  Right Atrium: Right atrial size was normal in size.  Interatrial Septum: No atrial level shunt detected by color flow Doppler. Agitated saline contrast was given intravenously to  evaluate for intracardiac shunting. There is no evidence of a patent foramen ovale.  Pericardium: There is no evidence of pericardial effusion.  Mitral Valve: The mitral valve is normal in  structure. Mitral valve regurgitation is mild by color flow Doppler. There is No evidence of mitral stenosis.  Tricuspid Valve: The tricuspid valve was normal in structure. Tricuspid valve regurgitation was not visualized by color flow Doppler. No evidence of tricuspid stenosis is present.  Aortic Valve: The aortic valve is normal in structure. Aortic valve regurgitation is moderate by color flow Doppler. The jet is centrally-directed. There is no stenosis of the aortic valve. There is moderate thickening and moderate calcification present on the aortic valve right coronary, left coronary and non-coronary cusps with normal mobility.  Pulmonic Valve: The pulmonic valve was normal in structure, with normal. Pulmonic valve regurgitation is not visualized by color flow Doppler.   Aorta: The aortic root and ascending aorta are normal in size and structure. There is mild dilatation of the aortic root, measuring 44 mm.  Shunts: There is no evidence of an atrial septal defect.  +--------------+--------++ LEFT VENTRICLE         +--------------+--------++ PLAX 2D                +--------------+--------++ LVOT diam:    2.40 cm  +--------------+--------++ LVOT Area:    4.52 cm +--------------+--------++                        +--------------+--------++  +------------------+---------++ LV Volumes (MOD)            +------------------+---------++ LV area d, A2C:   42.00 cm +------------------+---------++ LV area s, A2C:   28.20 cm +------------------+---------++ LV major d, A2C:  9.07 cm   +------------------+---------++ LV major s, A2C:  7.30 cm   +------------------+---------++ LV vol d, MOD A2C:161.0 ml  +------------------+---------++ LV vol s, MOD A2C:88.4 ml   +------------------+---------++ LV SV MOD A2C:    72.6 ml   +------------------+---------++  +-------------+-----------++ AORTIC VALVE              +-------------+-----------++ AV Vmax:     113.50 cm/s +-------------+-----------++ AV Vmean:    78.400 cm/s +-------------+-----------++ AV VTI:      0.207 m     +-------------+-----------++ AV Peak Grad:5.2 mmHg    +-------------+-----------++ AV Mean Grad:3.0 mmHg    +-------------+-----------++ AR PHT:      413 msec    +-------------+-----------++  +-------------+-------++ AORTA                +-------------+-------++ Ao Root diam:4.30 cm +-------------+-------++ Ao STJ diam: 3.4 cm  +-------------+-------++   +--------------+-------+ SHUNTS                +--------------+-------+ Systemic Diam:2.40 cm +--------------+-------+   Myrtie Soman MD Electronically signed by Myrtie Soman MD Signature Date/Time: 04/16/2021/1:26:11 PM    Final             Recent Labs: 04/14/2022: BUN 11; Creatinine, Ser 0.87; Potassium 4.1; Sodium 136  Recent Lipid Panel    Component Value Date/Time   CHOL 109 04/13/2021 0342   CHOL 115 04/04/2018 0838   TRIG 119 04/13/2021 0342   HDL 32 (L) 04/13/2021 0342   HDL 41 04/04/2018 0838   CHOLHDL 3.4 04/13/2021 0342   VLDL 24 04/13/2021 0342   LDLCALC 53 04/13/2021 0342   LDLCALC 53 04/04/2018 0838    Physical Exam:    VS:  BP 110/70   Pulse  66   Ht '5\' 10"'$  (1.778 m)   SpO2 97%   BMI 31.73 kg/m     Wt Readings from Last 3 Encounters:  09/03/21 221 lb 1.9 oz (100.3 kg)  08/25/21 221 lb (100.2 kg)  07/15/21 217 lb 9.5 oz (98.7 kg)    Gen: No distress   Neck: No JVD Ears: Bilateral  Pilar Plate Sign Cardiac: No Rubs or Gallops, no murmur, regular, +2 radial pulses Respiratory: Clear to auscultation bilaterally, normal effort, normal  respiratory rate GI: Soft, nontender, non-distended  MS: no edema;  moves all extremities Integument: Skin feels warm Neuro:  At time of evaluation, alert and oriented to person/place/time/situation  Psych: Normal affect, patient feels  well  ASSESSMENT:    1. CAD in native artery   2. Aortic root dilation (HCC)   3. Mixed hyperlipidemia   4. HFrEF (heart failure with reduced ejection fraction) (HCC)     PLAN:    CAD with 2022 LIMA to LAD, saphenous vein graft to OM, SVG D1 to first diagonal 27 mm Inspiris after Moderate AI PVCs - ASA 81 mg  - rosuvastatin 40, lipids today, LDL < 55  - No PRN nitro needed (will refill) - dental ppx (Amoxicillin) - metoprolol succinate   HFrEF and HTN Rare PVCs Mild Aortic Dilation 41 mm  - NYHA class I, Stage B, euvolemic, etiology from ischemia  - continue lisinopril 10 mg, low threshold to add SGLT2i given leg swelling - BB as above - Echo  October-November 2024  Medication Adjustments/Labs and Tests Ordered: Current medicines are reviewed at length with the patient today.  Concerns regarding medicines are outlined above.  Orders Placed This Encounter  Procedures   ALT   Lipid panel   EKG 12-Lead    Meds ordered this encounter  Medications   nitroGLYCERIN (NITROSTAT) 0.4 MG SL tablet    Sig: Place 1 tablet (0.4 mg total) under the tongue every 5 (five) minutes as needed.    Dispense:  25 tablet    Refill:  3     Patient Instructions  Medication Instructions:  Your physician recommends that you continue on your current medications as directed. Please refer to the Current Medication list given to you today. REFILLED: Nitroglycerin  *If you need a refill on your cardiac medications before your next appointment, please call your pharmacy*   Lab Work: TODAY: FLP, ALT  If you have labs (blood work) drawn today and your tests are completely normal, you will receive your results only by: [place] (if you have MyChart) OR A paper copy in the mail If you have any lab test that is abnormal or we need to change your treatment, we will call you to review the results.   Testing/Procedures:  OCT-NOV 2024: Your physician has requested that you have an  echocardiogram. Echocardiography is a painless test that uses sound waves to create images of your heart. It provides your doctor with information about the size and shape of your heart and how well your heart's chambers and valves are working. This procedure takes approximately one hour. There are no restrictions for this procedure. Please do NOT wear cologne, perfume, aftershave, or lotions (deodorant is allowed). Please arrive 15 minutes prior to your appointment time.    Follow-Up: At [facility], you and your health needs are our priority.  As part of our continuing mission to provide you with exceptional heart care, we have created designated Provider Care Teams.  These Care Teams include  your primary Cardiologist (physician) and Advanced Practice Providers (APPs -  Physician Assistants and Nurse Practitioners) who all work together to provide you with the care you need, when you need it.    Your next appointment:   11 month(s)  Provider:   Werner Lean, MD       Signed, Werner Lean, MD  06/30/2022 8:45 AM    [place]

## 2022-06-30 ENCOUNTER — Encounter: Payer: Self-pay | Admitting: Internal Medicine

## 2022-06-30 ENCOUNTER — Ambulatory Visit: Payer: PPO | Attending: Internal Medicine | Admitting: Internal Medicine

## 2022-06-30 VITALS — BP 110/70 | HR 66 | Ht 70.0 in

## 2022-06-30 DIAGNOSIS — I77810 Thoracic aortic ectasia: Secondary | ICD-10-CM | POA: Diagnosis not present

## 2022-06-30 DIAGNOSIS — E782 Mixed hyperlipidemia: Secondary | ICD-10-CM | POA: Diagnosis not present

## 2022-06-30 DIAGNOSIS — I5020 Unspecified systolic (congestive) heart failure: Secondary | ICD-10-CM | POA: Diagnosis not present

## 2022-06-30 DIAGNOSIS — I2510 Atherosclerotic heart disease of native coronary artery without angina pectoris: Secondary | ICD-10-CM | POA: Diagnosis not present

## 2022-06-30 DIAGNOSIS — I712 Thoracic aortic aneurysm, without rupture, unspecified: Secondary | ICD-10-CM | POA: Insufficient documentation

## 2022-06-30 LAB — ALT: ALT: 20 IU/L (ref 0–44)

## 2022-06-30 LAB — LIPID PANEL
Chol/HDL Ratio: 3.1 ratio (ref 0.0–5.0)
Cholesterol, Total: 116 mg/dL (ref 100–199)
HDL: 37 mg/dL — ABNORMAL LOW (ref >39)
LDL Chol Calc (NIH): 60 mg/dL (ref 0–99)
Triglycerides: 97 mg/dL (ref 0–149)
VLDL Cholesterol Cal: 19 mg/dL (ref 5–40)

## 2022-06-30 MED ORDER — NITROGLYCERIN 0.4 MG SL SUBL: 0.4000 mg | SUBLINGUAL_TABLET | SUBLINGUAL | 3 refills | Status: DC | PRN

## 2022-06-30 NOTE — Patient Instructions (Signed)
Medication Instructions:  Your physician recommends that you continue on your current medications as directed. Please refer to the Current Medication list given to you today. REFILLED: Nitroglycerin  *If you need a refill on your cardiac medications before your next appointment, please call your pharmacy*   Lab Work: TODAY: FLP, ALT  If you have labs (blood work) drawn today and your tests are completely normal, you will receive your results only by: [place] (if you have MyChart) OR A paper copy in the mail If you have any lab test that is abnormal or we need to change your treatment, we will call you to review the results.   Testing/Procedures:  OCT-NOV 2024: Your physician has requested that you have an echocardiogram. Echocardiography is a painless test that uses sound waves to create images of your heart. It provides your doctor with information about the size and shape of your heart and how well your heart's chambers and valves are working. This procedure takes approximately one hour. There are no restrictions for this procedure. Please do NOT wear cologne, perfume, aftershave, or lotions (deodorant is allowed). Please arrive 15 minutes prior to your appointment time.    Follow-Up: At [facility], you and your health needs are our priority.  As part of our continuing mission to provide you with exceptional heart care, we have created designated Provider Care Teams.  These Care Teams include your primary Cardiologist (physician) and Advanced Practice Providers (APPs -  Physician Assistants and Nurse Practitioners) who all work together to provide you with the care you need, when you need it.    Your next appointment:   11 month(s)  Provider:   Werner Lean, MD

## 2022-07-01 ENCOUNTER — Telehealth: Payer: Self-pay | Admitting: Internal Medicine

## 2022-07-01 NOTE — Telephone Encounter (Signed)
The patient has been notified of the result and verbalized understanding.  All questions (if any) were answered. Gershon Crane, LPN 4/0/4591 3:68 PM

## 2022-07-01 NOTE — Telephone Encounter (Signed)
Patient was returning call 

## 2022-07-14 ENCOUNTER — Ambulatory Visit: Payer: PPO | Admitting: Internal Medicine

## 2022-08-19 ENCOUNTER — Other Ambulatory Visit: Payer: Self-pay | Admitting: Internal Medicine

## 2022-09-12 DIAGNOSIS — I10 Essential (primary) hypertension: Secondary | ICD-10-CM | POA: Diagnosis not present

## 2022-09-12 DIAGNOSIS — Z0000 Encounter for general adult medical examination without abnormal findings: Secondary | ICD-10-CM | POA: Diagnosis not present

## 2022-09-12 DIAGNOSIS — E7800 Pure hypercholesterolemia, unspecified: Secondary | ICD-10-CM | POA: Diagnosis not present

## 2022-09-12 DIAGNOSIS — I77810 Thoracic aortic ectasia: Secondary | ICD-10-CM | POA: Diagnosis not present

## 2022-09-12 DIAGNOSIS — I5020 Unspecified systolic (congestive) heart failure: Secondary | ICD-10-CM | POA: Diagnosis not present

## 2022-09-12 DIAGNOSIS — I110 Hypertensive heart disease with heart failure: Secondary | ICD-10-CM | POA: Diagnosis not present

## 2022-09-12 DIAGNOSIS — I2510 Atherosclerotic heart disease of native coronary artery without angina pectoris: Secondary | ICD-10-CM | POA: Diagnosis not present

## 2022-09-12 DIAGNOSIS — Z23 Encounter for immunization: Secondary | ICD-10-CM | POA: Diagnosis not present

## 2022-09-12 DIAGNOSIS — R7309 Other abnormal glucose: Secondary | ICD-10-CM | POA: Diagnosis not present

## 2022-09-12 DIAGNOSIS — Z1331 Encounter for screening for depression: Secondary | ICD-10-CM | POA: Diagnosis not present

## 2023-02-26 ENCOUNTER — Other Ambulatory Visit: Payer: Self-pay | Admitting: Internal Medicine

## 2023-02-26 DIAGNOSIS — Z952 Presence of prosthetic heart valve: Secondary | ICD-10-CM

## 2023-02-26 DIAGNOSIS — I712 Thoracic aortic aneurysm, without rupture, unspecified: Secondary | ICD-10-CM

## 2023-02-26 DIAGNOSIS — I5042 Chronic combined systolic (congestive) and diastolic (congestive) heart failure: Secondary | ICD-10-CM

## 2023-02-26 DIAGNOSIS — Z79899 Other long term (current) drug therapy: Secondary | ICD-10-CM

## 2023-03-20 ENCOUNTER — Ambulatory Visit (HOSPITAL_COMMUNITY): Payer: PPO | Attending: Internal Medicine

## 2023-03-20 DIAGNOSIS — I5042 Chronic combined systolic (congestive) and diastolic (congestive) heart failure: Secondary | ICD-10-CM | POA: Insufficient documentation

## 2023-03-20 DIAGNOSIS — Z79899 Other long term (current) drug therapy: Secondary | ICD-10-CM | POA: Diagnosis not present

## 2023-03-20 DIAGNOSIS — Z952 Presence of prosthetic heart valve: Secondary | ICD-10-CM | POA: Insufficient documentation

## 2023-03-20 DIAGNOSIS — I712 Thoracic aortic aneurysm, without rupture, unspecified: Secondary | ICD-10-CM | POA: Insufficient documentation

## 2023-03-20 LAB — ECHOCARDIOGRAM COMPLETE
AR max vel: 1.03 cm2
AV Area VTI: 1.06 cm2
AV Area mean vel: 0.98 cm2
AV Mean grad: 8 mm[Hg]
AV Peak grad: 14.1 mm[Hg]
Ao pk vel: 1.88 m/s
Area-P 1/2: 2.9 cm2
Est EF: 45
S' Lateral: 3.8 cm

## 2023-04-06 DIAGNOSIS — R040 Epistaxis: Secondary | ICD-10-CM | POA: Diagnosis not present

## 2023-04-06 DIAGNOSIS — I209 Angina pectoris, unspecified: Secondary | ICD-10-CM | POA: Diagnosis not present

## 2023-04-06 DIAGNOSIS — I10 Essential (primary) hypertension: Secondary | ICD-10-CM | POA: Diagnosis not present

## 2023-05-09 DIAGNOSIS — I10 Essential (primary) hypertension: Secondary | ICD-10-CM | POA: Diagnosis not present

## 2023-05-09 DIAGNOSIS — I2510 Atherosclerotic heart disease of native coronary artery without angina pectoris: Secondary | ICD-10-CM | POA: Diagnosis not present

## 2023-05-09 DIAGNOSIS — R040 Epistaxis: Secondary | ICD-10-CM | POA: Diagnosis not present

## 2023-06-28 ENCOUNTER — Other Ambulatory Visit: Payer: Self-pay | Admitting: Family Medicine

## 2023-06-28 DIAGNOSIS — R519 Headache, unspecified: Secondary | ICD-10-CM

## 2023-06-29 ENCOUNTER — Ambulatory Visit
Admission: RE | Admit: 2023-06-29 | Discharge: 2023-06-29 | Disposition: A | Discharge status: Home / Self Care | Payer: PPO | Source: Ambulatory Visit | Attending: Family Medicine | Admitting: Family Medicine

## 2023-06-29 DIAGNOSIS — R519 Headache, unspecified: Secondary | ICD-10-CM

## 2023-08-26 ENCOUNTER — Other Ambulatory Visit: Payer: Self-pay | Admitting: Internal Medicine

## 2023-09-20 ENCOUNTER — Other Ambulatory Visit: Payer: Self-pay | Admitting: Internal Medicine

## 2023-09-24 ENCOUNTER — Other Ambulatory Visit: Payer: Self-pay | Admitting: Internal Medicine

## 2023-10-11 ENCOUNTER — Other Ambulatory Visit: Payer: Self-pay | Admitting: Internal Medicine

## 2023-10-22 ENCOUNTER — Other Ambulatory Visit: Payer: Self-pay | Admitting: Internal Medicine

## 2024-02-21 ENCOUNTER — Other Ambulatory Visit: Payer: Self-pay

## 2024-02-21 DIAGNOSIS — I5042 Chronic combined systolic (congestive) and diastolic (congestive) heart failure: Secondary | ICD-10-CM

## 2024-02-21 DIAGNOSIS — Z79899 Other long term (current) drug therapy: Secondary | ICD-10-CM

## 2024-02-21 DIAGNOSIS — I712 Thoracic aortic aneurysm, without rupture, unspecified: Secondary | ICD-10-CM

## 2024-02-21 DIAGNOSIS — Z952 Presence of prosthetic heart valve: Secondary | ICD-10-CM

## 2024-02-21 MED ORDER — LISINOPRIL 20 MG PO TABS
20.0000 mg | ORAL_TABLET | Freq: Every day | ORAL | 0 refills | Status: DC
Start: 2024-02-21 — End: 2024-04-16

## 2024-03-04 ENCOUNTER — Other Ambulatory Visit: Payer: Self-pay | Admitting: Internal Medicine

## 2024-03-04 DIAGNOSIS — Z79899 Other long term (current) drug therapy: Secondary | ICD-10-CM

## 2024-03-04 DIAGNOSIS — I5042 Chronic combined systolic (congestive) and diastolic (congestive) heart failure: Secondary | ICD-10-CM

## 2024-03-04 DIAGNOSIS — Z952 Presence of prosthetic heart valve: Secondary | ICD-10-CM

## 2024-03-04 DIAGNOSIS — I712 Thoracic aortic aneurysm, without rupture, unspecified: Secondary | ICD-10-CM

## 2024-04-12 ENCOUNTER — Other Ambulatory Visit: Payer: Self-pay | Admitting: Internal Medicine

## 2024-04-12 DIAGNOSIS — I712 Thoracic aortic aneurysm, without rupture, unspecified: Secondary | ICD-10-CM

## 2024-04-12 DIAGNOSIS — I5042 Chronic combined systolic (congestive) and diastolic (congestive) heart failure: Secondary | ICD-10-CM

## 2024-04-12 DIAGNOSIS — Z79899 Other long term (current) drug therapy: Secondary | ICD-10-CM

## 2024-04-12 DIAGNOSIS — Z952 Presence of prosthetic heart valve: Secondary | ICD-10-CM

## 2024-04-16 ENCOUNTER — Ambulatory Visit: Attending: Cardiology | Admitting: Cardiology

## 2024-04-16 ENCOUNTER — Encounter: Payer: Self-pay | Admitting: Cardiology

## 2024-04-16 VITALS — BP 140/96 | HR 82 | Ht 70.0 in | Wt 213.8 lb

## 2024-04-16 DIAGNOSIS — I2510 Atherosclerotic heart disease of native coronary artery without angina pectoris: Secondary | ICD-10-CM | POA: Diagnosis not present

## 2024-04-16 DIAGNOSIS — I5042 Chronic combined systolic (congestive) and diastolic (congestive) heart failure: Secondary | ICD-10-CM

## 2024-04-16 DIAGNOSIS — I712 Thoracic aortic aneurysm, without rupture, unspecified: Secondary | ICD-10-CM | POA: Diagnosis not present

## 2024-04-16 DIAGNOSIS — Z79899 Other long term (current) drug therapy: Secondary | ICD-10-CM | POA: Diagnosis not present

## 2024-04-16 DIAGNOSIS — Z952 Presence of prosthetic heart valve: Secondary | ICD-10-CM

## 2024-04-16 DIAGNOSIS — Z951 Presence of aortocoronary bypass graft: Secondary | ICD-10-CM | POA: Diagnosis not present

## 2024-04-16 DIAGNOSIS — I5020 Unspecified systolic (congestive) heart failure: Secondary | ICD-10-CM

## 2024-04-16 MED ORDER — NITROGLYCERIN 0.4 MG SL SUBL: 0.4000 mg | SUBLINGUAL_TABLET | SUBLINGUAL | 2 refills | Status: AC | PRN

## 2024-04-16 MED ORDER — METOPROLOL SUCCINATE ER 100 MG PO TB24: ORAL_TABLET | ORAL | 3 refills | Status: AC

## 2024-04-16 MED ORDER — LISINOPRIL 20 MG PO TABS: 20.0000 mg | ORAL_TABLET | Freq: Every day | ORAL | 3 refills | Status: AC

## 2024-04-16 MED ORDER — AMOXICILLIN 500 MG PO CAPS: ORAL_CAPSULE | ORAL | 2 refills | Status: AC

## 2024-04-16 NOTE — Patient Instructions (Addendum)
 Medication Instructions:  Amoxicillin sent to take 30 - 60 mins before dental procedure.   *If you need a refill on your cardiac medications before your next appointment, please call your pharmacy*  Lab Work: None  If you have labs (blood work) drawn today and your tests are completely normal, you will receive your results only by: MyChart Message (if you have MyChart) OR A paper copy in the mail If you have any lab test that is abnormal or we need to change your treatment, we will call you to review the results.  Testing/Procedures: None   Follow-Up: At [facility], you and your health needs are our priority.  As part of our continuing mission to provide you with exceptional heart care, our providers are all part of one team.  This team includes your primary Cardiologist (physician) and Advanced Practice Providers or APPs (Physician Assistants and Nurse Practitioners) who all work together to provide you with the care you need, when you need it.  Your next appointment:   4 month(s)  Provider:   Stanly DELENA Leavens, MD   We recommend signing up for the patient portal called MyChart.  Sign up information is provided on this After Visit Summary.  MyChart is used to connect with patients for Virtual Visits (Telemedicine).  Patients are able to view lab/test results, encounter notes, upcoming appointments, etc.  Non-urgent messages can be sent to your provider as well.   To learn more about what you can do with MyChart, go to [email or web address].   Other Instructions None

## 2024-04-16 NOTE — Progress Notes (Signed)
 Cardiology Office Note:  .   Date:  04/16/2024  ID:  Dwayne [name], DOB 1955/08/12, MRN [id number] PCP: Auston Opal, DO   HeartCare Providers Cardiologist:  Stanly DELENA Leavens, MD {  History of Present Illness: Dwayne   Dwayne [name] is a 68 y.o. male with history of CAD (STEMI 2017 s/p PCI to LAD then CABG and AVR 04/2021), ICM, prior CVA 2017,  HTN, HLD.     CAD ICM 2017 STEMI.  DES to LAD.  EF 25 to 30%, improved to 40 to 45% 03/2021 admitted for chest pain, found to have multivessel CAD eventually prompting CABG x 3.  Preoperative TEE with mild to moderate AR with mild dilation of aortic root.  LIMA to LAD, reverse saphenous vein graft to obtuse marginal, reverse saphenous vein graft to first diagonal, AVR with 27 mm Inspiris Resilia tissue valve. Post-op echo showed EF 45-50%  04/2021 EF 40 to 45% 03/2022 EF 45 to 50% 02/2023 EF 45%  Social history  No prior tobacco use.  Occasional drinker 5 to 6/week.  No drugs. Lately active 1 to 2 days/week and walks around the house. Somewhat limited by prior hip replacement      Patient with history of CAD status post CABG and ICM with mildly reduced EF persistently since about 2017.  However has done well and followed on an annual basis.  Last seen 06/2022 doing well from a cardiac perspective without any acute complaints.  Was not needing his nitroglycerin .  Demonstrated NYHA class I symptoms.  Today patient presents for annual follow-up and he is accompanied with his wife.  Over the past year he has had no complaints and has done well.  Has not needed his nitroglycerin  whatsoever.  Continues to be decently active and mainly completing just household chores but demonstrating no exertional symptoms.  Denied any shortness of breath, orthopnea, peripheral edema.  Doing well overall.  They did report cough that is predominantly only at night and early in the morning.  Denied history of acid reflux.  Not clearly associated with  acidic or spicy foods.  Denied any constitutional symptoms, fever, weight loss, nausea, vomiting.   ROS: Denies: Chest pain, shortness of breath, orthopnea, peripheral edema, palpitations, decreased exercise intolerance, fatigue, lightheadedness.   Studies Reviewed: Dwayne    EKG Interpretation Date/Time:  Tuesday April 16 2024 15:09:56 EST Ventricular Rate:  82 PR Interval:  172 QRS Duration:  88 QT Interval:  390 QTC Calculation: 455 R Axis:   10  Text Interpretation: Sinus rhythm with occasional Premature ventricular complexes Nonspecific T wave abnormality When compared with ECG of 16-Apr-2021 06:53, Premature ventricular complexes are now Present Confirmed by Darryle Currier [id number]) on 04/16/2024 3:16:36 PM    Risk Assessment/Calculations:         Physical Exam:   VS:  BP (!) 140/96   Pulse 82   Ht 5' 10 (1.778 m)   Wt 213 lb 12.8 oz (97 kg)   SpO2 97%   BMI 30.68 kg/m    Wt Readings from Last 3 Encounters:  04/16/24 213 lb 12.8 oz (97 kg)  09/03/21 221 lb 1.9 oz (100.3 kg)  08/25/21 221 lb (100.2 kg)    GEN: Well nourished, well developed in no acute distress NECK: No JVD; No carotid bruits CARDIAC: RRR, no murmurs, rubs, gallops RESPIRATORY:  Clear to auscultation without rales, wheezing or rhonchi  ABDOMEN: Soft, non-tender, non-distended EXTREMITIES:  No edema; No deformity   ASSESSMENT AND PLAN: .  CAD Hyperlipidemia -03/2021 CABG x 3 Stable disease with no anginal complaints or equivalents. Continue with aspirin , Toprol -XL 100 mg, rosuvastatin  40 mg.  LDL is 67.  Ideally would be less than 55. Discussed possibly adding Zetia versus lifestyle modifications, he would like to work on his diet more and engage in more exercise which is reasonable. Will provide a refill on nitroglycerin  just so he has it.  Consider repeating LDL at follow-up appointment.  ICM with mildly reduced EF -02/2023 EF 45% Chronically has had mildly reduced EF since about 2017.  EF 45%  02/2023 with WMA, G2 DD, mildly reduced RV.  Aortic dilation 40 mm.  Well compensated disease, euvolemic with NYHA class I symptoms. GDMT: Continue with lisinopril  20 mg, Toprol -XL 100 mg. Repeat echocardiogram given need for aortic aneurysm monitoring, valve function, EF.  Titrate GDMT more aggressively should EF come back more reduced.  AVR -03/2021 at the time of CABG Normal valve function 02/2023.  Mean gradient 8. Will order Amoxicillin 2 g PO 30-60 minutes before any major dental procedures.   Cough Reporting nocturnal and early morning cough.  Question if this could be related to acid reflux, recommend following up with his PCP for further evaluation.  Low suspicion for infectious etiologies.  Possibly could be related to ACE inhibitor but would workup secondary etiologies before changing his GDMT but could consider ARB therapy in the future.  HTN Slightly elevated but historically well-controlled at home and at previous office visits.  Continue in the context above.    Dispo: Patient overall doing very well, likely could be seen on an annual basis but wife is very adamant that he was seen by MD in 4 months.  Signed, Thom LITTIE Sluder, PA-C

## 2024-04-18 ENCOUNTER — Ambulatory Visit (HOSPITAL_COMMUNITY)
Admission: RE | Admit: 2024-04-18 | Discharge: 2024-04-18 | Disposition: A | Discharge status: Home / Self Care | Source: Ambulatory Visit | Attending: Internal Medicine | Admitting: Internal Medicine

## 2024-04-18 DIAGNOSIS — Z953 Presence of xenogenic heart valve: Secondary | ICD-10-CM | POA: Diagnosis not present

## 2024-04-18 DIAGNOSIS — I5020 Unspecified systolic (congestive) heart failure: Secondary | ICD-10-CM

## 2024-04-18 DIAGNOSIS — Z952 Presence of prosthetic heart valve: Secondary | ICD-10-CM

## 2024-04-18 DIAGNOSIS — I359 Nonrheumatic aortic valve disorder, unspecified: Secondary | ICD-10-CM | POA: Insufficient documentation

## 2024-04-18 LAB — ECHOCARDIOGRAM COMPLETE
AR max vel: 1.8 cm2
AV Area VTI: 1.64 cm2
AV Area mean vel: 1.82 cm2
AV Mean grad: 5.7 mmHg
AV Peak grad: 11.5 mmHg
Ao pk vel: 1.7 m/s
Area-P 1/2: 9.73 cm2
Calc EF: 50.1 %
S' Lateral: 3.9 cm
Single Plane A2C EF: 53.8 %
Single Plane A4C EF: 50 %

## 2024-04-18 NOTE — Progress Notes (Signed)
  Echocardiogram 2D Echocardiogram has been performed.  Dwayne [name] ORN [facility] 04/18/2024, 2:46 PM

## 2024-04-21 ENCOUNTER — Ambulatory Visit: Payer: Self-pay | Admitting: Internal Medicine

## 2024-05-01 ENCOUNTER — Encounter: Payer: Self-pay | Admitting: *Deleted

## 2024-05-10 ENCOUNTER — Telehealth: Payer: Self-pay | Admitting: Internal Medicine

## 2024-05-10 MED ORDER — ROSUVASTATIN CALCIUM 40 MG PO TABS: 40.0000 mg | ORAL_TABLET | Freq: Every day | ORAL | 3 refills | Status: AC

## 2024-05-10 NOTE — Telephone Encounter (Signed)
°*  STAT* If patient is at the pharmacy, call can be transferred to refill team.   1. Which medications need to be refilled? (please list name of each medication and dose if known)   rosuvastatin  (CRESTOR ) 40 MG tablet    2. Which pharmacy/location (including street and city if local pharmacy) is medication to be sent to? [id number] [place], [place] - [address]  3. Do they need a 30 day or 90 day supply?  90 day supply

## 2024-05-10 NOTE — Telephone Encounter (Signed)
 Refill sent

## 2024-05-28 ENCOUNTER — Ambulatory Visit (HOSPITAL_COMMUNITY)

## 2024-07-29 ENCOUNTER — Ambulatory Visit: Admitting: Internal Medicine
